# Patient Record
Sex: Female | Born: 1995
Health system: Southern US, Community
[De-identification: ages and names within clinical notes are randomized; demographics above are authoritative.]

## PROBLEM LIST (undated history)

## (undated) ENCOUNTER — Inpatient Hospital Stay (HOSPITAL_COMMUNITY): Payer: Self-pay

## (undated) DIAGNOSIS — F32A Depression, unspecified: Secondary | ICD-10-CM

## (undated) DIAGNOSIS — B999 Unspecified infectious disease: Secondary | ICD-10-CM

## (undated) DIAGNOSIS — J45909 Unspecified asthma, uncomplicated: Secondary | ICD-10-CM

## (undated) DIAGNOSIS — F419 Anxiety disorder, unspecified: Secondary | ICD-10-CM

## (undated) DIAGNOSIS — C801 Malignant (primary) neoplasm, unspecified: Secondary | ICD-10-CM

## (undated) DIAGNOSIS — F329 Major depressive disorder, single episode, unspecified: Secondary | ICD-10-CM

## (undated) DIAGNOSIS — R519 Headache, unspecified: Secondary | ICD-10-CM

## (undated) DIAGNOSIS — R569 Unspecified convulsions: Secondary | ICD-10-CM

## (undated) DIAGNOSIS — D649 Anemia, unspecified: Secondary | ICD-10-CM

## (undated) DIAGNOSIS — R51 Headache: Secondary | ICD-10-CM

## (undated) DIAGNOSIS — Z803 Family history of malignant neoplasm of breast: Secondary | ICD-10-CM

## (undated) HISTORY — DX: Unspecified asthma, uncomplicated: J45.909

## (undated) HISTORY — DX: Anemia, unspecified: D64.9

## (undated) HISTORY — PX: NO PAST SURGERIES: SHX2092

## (undated) HISTORY — DX: Family history of malignant neoplasm of breast: Z80.3

---
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
## 2016-06-12 ENCOUNTER — Ambulatory Visit (INDEPENDENT_AMBULATORY_CARE_PROVIDER_SITE_OTHER): Payer: Managed Care, Other (non HMO) | Admitting: Physician Assistant

## 2016-06-12 VITALS — BP 104/70 | HR 87 | Temp 98.1°F | Resp 17 | Ht 62.0 in | Wt 109.0 lb

## 2016-06-12 DIAGNOSIS — M549 Dorsalgia, unspecified: Secondary | ICD-10-CM | POA: Diagnosis not present

## 2016-06-12 DIAGNOSIS — Z3201 Encounter for pregnancy test, result positive: Secondary | ICD-10-CM

## 2016-06-12 DIAGNOSIS — R350 Frequency of micturition: Secondary | ICD-10-CM | POA: Diagnosis not present

## 2016-06-12 LAB — POCT CBC
Granulocyte percent: 5.4 %G — AB (ref 37–80)
HCT, POC: 37.2 % — AB (ref 37.7–47.9)
HEMOGLOBIN: 13.1 g/dL (ref 12.2–16.2)
LYMPH, POC: 1.5 (ref 0.6–3.4)
MCH, POC: 32.5 pg — AB (ref 27–31.2)
MCHC: 35.1 g/dL (ref 31.8–35.4)
MCV: 92.5 fL (ref 80–97)
MID (cbc): 0.3 (ref 0–0.9)
MPV: 7.7 fL (ref 0–99.8)
PLATELET COUNT, POC: 215 10*3/uL (ref 142–424)
POC Granulocyte: 5.4 (ref 2–6.9)
POC LYMPH PERCENT: 21 %L (ref 10–50)
POC MID %: 4.5 % (ref 0–12)
RBC: 4.03 M/uL — AB (ref 4.04–5.48)
RDW, POC: 12.3 %
WBC: 7.2 10*3/uL (ref 4.6–10.2)

## 2016-06-12 LAB — POC MICROSCOPIC URINALYSIS (UMFC): MUCUS RE: ABSENT

## 2016-06-12 LAB — POCT URINALYSIS DIP (MANUAL ENTRY)
BILIRUBIN UA: NEGATIVE
Glucose, UA: NEGATIVE
Ketones, POC UA: NEGATIVE
Leukocytes, UA: NEGATIVE
NITRITE UA: NEGATIVE
PH UA: 6
Protein Ur, POC: NEGATIVE
RBC UA: NEGATIVE
Spec Grav, UA: >=1.03
UROBILINOGEN UA: 1

## 2016-06-12 LAB — POCT URINE PREGNANCY: PREG TEST UR: POSITIVE — AB

## 2016-06-12 NOTE — Patient Instructions (Addendum)
Your urine is untelling.  I will send out for a urine culture.  If anything grows, then I will contact you. By your last menstrual period, you are 4 weeks and 1 day.   If you experience severe abdominal pain, dizziness, bleeding--please seek medical care. If you need any extra guidance please contact me. If you have questions that you may want to ask outside of here to weigh your options, there is also planned parenthood.  First Trimester of Pregnancy The first trimester of pregnancy is from week 1 until the end of week 12 (months 1 through 3). During this time, your baby will begin to develop inside you. At 6-8 weeks, the eyes and face are formed, and the heartbeat can be seen on ultrasound. At the end of 12 weeks, all the baby's organs are formed. Prenatal care is all the medical care you receive before the birth of your baby. Make sure you get good prenatal care and follow all of your doctor's instructions. Follow these instructions at home: Medicines  Take medicine only as told by your doctor. Some medicines are safe and some are not during pregnancy.  Take your prenatal vitamins as told by your doctor.  Take medicine that helps you poop (stool softener) as needed if your doctor says it is okay. Diet  Eat regular, healthy meals.  Your doctor will tell you the amount of weight gain that is right for you.  Avoid raw meat and uncooked cheese.  If you feel sick to your stomach (nauseous) or throw up (vomit):  Eat 4 or 5 small meals a day instead of 3 large meals.  Try eating a few soda crackers.  Drink liquids between meals instead of during meals.  If you have a hard time pooping (constipation):  Eat high-fiber foods like fresh vegetables, fruit, and whole grains.  Drink enough fluids to keep your pee (urine) clear or pale yellow. Activity and Exercise  Exercise only as told by your doctor. Stop exercising if you have cramps or pain in your lower belly (abdomen) or low  back.  Try to avoid standing for long periods of time. Move your legs often if you must stand in one place for a long time.  Avoid heavy lifting.  Wear low-heeled shoes. Sit and stand up straight.  You can have sex unless your doctor tells you not to. Relief of Pain or Discomfort  Wear a good support bra if your breasts are sore.  Take warm water baths (sitz baths) to soothe pain or discomfort caused by hemorrhoids. Use hemorrhoid cream if your doctor says it is okay.  Rest with your legs raised if you have leg cramps or low back pain.  Wear support hose if you have puffy, bulging veins (varicose veins) in your legs. Raise (elevate) your feet for 15 minutes, 3-4 times a day. Limit salt in your diet. Prenatal Care  Schedule your prenatal visits by the twelfth week of pregnancy.  Write down your questions. Take them to your prenatal visits.  Keep all your prenatal visits as told by your doctor. Safety  Wear your seat belt at all times when driving.  Make a list of emergency phone numbers. The list should include numbers for family, friends, the hospital, and police and fire departments. General Tips  Ask your doctor for a referral to a local prenatal class. Begin classes no later than at the start of month 6 of your pregnancy.  Ask for help if you need counseling or  help with nutrition. Your doctor can give you advice or tell you where to go for help.  Do not use hot tubs, steam rooms, or saunas.  Do not douche or use tampons or scented sanitary pads.  Do not cross your legs for long periods of time.  Avoid litter boxes and soil used by cats.  Avoid all smoking, herbs, and alcohol. Avoid drugs not approved by your doctor.  Do not use any tobacco products, including cigarettes, chewing tobacco, and electronic cigarettes. If you need help quitting, ask your doctor. You may get counseling or other support to help you quit.  Visit your dentist. At home, brush your teeth with  a soft toothbrush. Be gentle when you floss. Get help if:  You are dizzy.  You have mild cramps or pressure in your lower belly.  You have a nagging pain in your belly area.  You continue to feel sick to your stomach, throw up, or have watery poop (diarrhea).  You have a bad smelling fluid coming from your vagina.  You have pain with peeing (urination).  You have increased puffiness (swelling) in your face, hands, legs, or ankles. Get help right away if:  You have a fever.  You are leaking fluid from your vagina.  You have spotting or bleeding from your vagina.  You have very bad belly cramping or pain.  You gain or lose weight rapidly.  You throw up blood. It may look like coffee grounds.  You are around people who have [place] measles, fifth disease, or chickenpox.  You have a very bad headache.  You have shortness of breath.  You have any kind of trauma, such as from a fall or a car accident. This information is not intended to replace advice given to you by your health care provider. Make sure you discuss any questions you have with your health care provider. Document Released: 12/10/2007 Document Revised: 11/29/2015 Document Reviewed: 05/03/2013 Elsevier Interactive Patient Education  2017 [employer].    IF you received an x-ray today, you will receive an invoice from [facility]. Please contact [facility] at [phone] with questions or concerns regarding your invoice.   IF you received labwork today, you will receive an invoice from [employer]. Please contact [facility] at [phone] with questions or concerns regarding your invoice.   Our billing staff will not be able to assist you with questions regarding bills from these companies.  You will be contacted with the lab results as soon as they are available. The fastest way to get your results is to activate your My Chart account. Instructions are located on  the last page of this paperwork. If you have not heard from [place] regarding the results in 2 weeks, please contact this office.

## 2016-06-12 NOTE — Progress Notes (Addendum)
[facility] [address], [place] [address] [phone]  Date:  06/12/2016   Name:  Michaela Williams   DOB:  [birth year]   MRN:  [id number]  PCP:  No primary care provider on file.   Chief Complaint  Patient presents with  . Other    Pt unsure why she is here. Pt states mother told her to come to the Dr.   . Wynelle Link Pain  . Urinary Tract Infection    strong odor     History of Present Illness:  Michaela Williams is a 20 y.o. female patient who presents to [facility] for cc of back pain and urinary odor.  She has been without her medication for the last 2 weeks.  Sexually active with protection.   Urinary symptoms--lower back pains, malodorous urine, frequency, and mild pain.  No hematuria.  No abnormal vaginal discharge or odor.  Subjective fever and chills.   No vaginal bleeding.   Patient's last menstrual period was 05/14/2016.   There are no active problems to display for this patient.   No past medical history on file.  No past surgical history on file.  Social History  Substance Use Topics  . Smoking status: Never Smoker  . Smokeless tobacco: Not on file  . Alcohol use Not on file    Family History  Problem Relation Age of Onset  . Mental illness Mother   . Cancer Maternal Grandmother   . Hypertension Maternal Grandmother     Not on File  Medication list has been reviewed and updated.  No current outpatient prescriptions on file prior to visit.   No current facility-administered medications on file prior to visit.     ROS ROS otherwise unremarkable unless listed above.   Physical Examination: BP 104/70 (BP Location: Right Arm, Patient Position: Sitting, Cuff Size: Normal)   Pulse 87   Temp 98.1 F (36.7 C) (Oral)   Resp 17   Ht 5\' 2"  (1.575 m)   Wt 109 lb (49.4 kg)   LMP 05/14/2016   SpO2 100%   BMI 19.94 kg/m  Ideal Body Weight: Weight in (lb) to have BMI = 25: 136.4  Physical Exam  Constitutional: She is oriented to person,  place, and time. She appears well-developed and well-nourished. No distress.  HENT:  Head: Normocephalic and atraumatic.  Right Ear: External ear normal.  Left Ear: External ear normal.  Eyes: Conjunctivae and EOM are normal. Pupils are equal, round, and reactive to light.  Cardiovascular: Normal rate.   Pulmonary/Chest: Effort normal. No respiratory distress. She has no decreased breath sounds. She has no wheezes.  Abdominal: Soft. Normal appearance and bowel sounds are normal. There is tenderness in the right lower quadrant, periumbilical area, suprapubic area and left lower quadrant. There is CVA tenderness.  Neurological: She is alert and oriented to person, place, and time.  Skin: She is not diaphoretic.  Psychiatric: She has a normal mood and affect. Her behavior is normal.     Results for orders placed or performed in visit on 06/12/16  Beta HCG, Quant  Result Value Ref Range   hCG Quant 391 mIU/mL  POCT Microscopic Urinalysis ([facility])  Result Value Ref Range   WBC,UR,HPF,POC Moderate (A) None WBC/hpf   RBC,UR,HPF,POC None None RBC/hpf   Bacteria None None, Too numerous to count   Mucus Absent Absent   Epithelial Cells, UR Per Microscopy Few (A) None, Too numerous to count cells/hpf  POCT urinalysis dipstick  Result Value Ref Range   Color, UA yellow yellow   Clarity, UA clear clear   Glucose, UA negative negative   Bilirubin, UA negative negative   Ketones, POC UA negative negative   Spec Grav, UA >=1.030    Blood, UA negative negative   pH, UA 6.0    Protein Ur, POC negative negative   Urobilinogen, UA 1.0    Nitrite, UA Negative Negative   Leukocytes, UA Negative Negative  POCT urine pregnancy  Result Value Ref Range   Preg Test, Ur Positive (A) Negative  POCT CBC  Result Value Ref Range   WBC 7.2 4.6 - 10.2 K/uL   Lymph, poc 1.5 0.6 - 3.4   POC LYMPH PERCENT 21.0 10 - 50 %L   MID (cbc) 0.3 0 - 0.9   POC MID % 4.5 0 - 12 %M   POC Granulocyte 5.4 2 - 6.9    Granulocyte percent 5.4 (A) 37 - 80 %G   RBC 4.03 (A) 4.04 - 5.48 M/uL   Hemoglobin 13.1 12.2 - 16.2 g/dL   HCT, POC 37.2 (A) 37.7 - 47.9 %   MCV 92.5 80 - 97 fL   MCH, POC 32.5 (A) 27 - 31.2 pg   MCHC 35.1 31.8 - 35.4 g/dL   RDW, POC 12.3 %   Platelet Count, POC 215 142 - 424 K/uL   MPV 7.7 0 - 99.8 fL     Assessment and Plan: Michaela Williams is a 20 y.o. female who is here today for cc of malodorous odor. Will go ahead with urinary culture.  Obtaining hcg quant to narrow weeks of conception. Letter written for obgyn. Discussed options.    Urine frequency - Plan: POCT Microscopic Urinalysis ([facility]), POCT urinalysis dipstick, POCT urine pregnancy  Acute bilateral back pain, unspecified back location - Plan: POCT CBC  Positive pregnancy test - Plan: Beta HCG, Quant  Appears to be 4-6 weeks.  Ivar Drape, PA-C [facility] 07/11/2016 1:18 PM

## 2016-06-13 LAB — BETA HCG QUANT (REF LAB): hCG Quant: 391 m[IU]/mL

## 2016-06-18 ENCOUNTER — Telehealth: Payer: Self-pay

## 2016-06-18 NOTE — Telephone Encounter (Signed)
pts mom called and said daugher is till awaiting a call re her Labs,  Please call pt at [phone]

## 2016-06-19 NOTE — Telephone Encounter (Signed)
Routed to [place]. Please review.

## 2016-06-20 NOTE — Telephone Encounter (Signed)
Patient is calling again about getting her lab results  [phone]

## 2016-07-07 NOTE — L&D Delivery Note (Signed)
21 y.o. G1P0000 at [redacted]w[redacted]d admitted for SOL delivered a viable female infant at 2446 in cephalic, LOA position. No nuchal cord. Right anterior shoulder delivered with ease. 60 sec delayed cord clamping. Cord clamped x2 and cut. Placenta delivered spontaneously intact, with 3VC. Fundus firm on exam with massage and pitocin. Good hemostasis noted.  Anesthesia: Epidural Laceration: 2nd degree perineal Suture: 3.0 Vicryl Good hemostasis noted. EBL: 250 cc  Mom and baby recovering in LDR.    Apgars: APGAR (1 MIN): 9   APGAR (5 MINS): 10   Weight: Pending skin to skin  Sponge and instrument count were correct x2. Placenta sent to L&D. Breast feeding, IUD vs Nexplanon for contraception.  [place] Jahzeel Poythress, DO FM Resident PGY-1 02/04/2017 6:38 AM

## 2016-07-11 ENCOUNTER — Encounter: Payer: Self-pay | Admitting: *Deleted

## 2016-07-16 ENCOUNTER — Ambulatory Visit (INDEPENDENT_AMBULATORY_CARE_PROVIDER_SITE_OTHER): Payer: Managed Care, Other (non HMO) | Admitting: Obstetrics & Gynecology

## 2016-07-16 ENCOUNTER — Encounter: Payer: Self-pay | Admitting: Obstetrics & Gynecology

## 2016-07-16 DIAGNOSIS — Z124 Encounter for screening for malignant neoplasm of cervix: Secondary | ICD-10-CM

## 2016-07-16 DIAGNOSIS — Z113 Encounter for screening for infections with a predominantly sexual mode of transmission: Secondary | ICD-10-CM | POA: Diagnosis not present

## 2016-07-16 DIAGNOSIS — O99519 Diseases of the respiratory system complicating pregnancy, unspecified trimester: Secondary | ICD-10-CM

## 2016-07-16 DIAGNOSIS — O99511 Diseases of the respiratory system complicating pregnancy, first trimester: Secondary | ICD-10-CM

## 2016-07-16 DIAGNOSIS — F419 Anxiety disorder, unspecified: Secondary | ICD-10-CM

## 2016-07-16 DIAGNOSIS — Z3490 Encounter for supervision of normal pregnancy, unspecified, unspecified trimester: Secondary | ICD-10-CM | POA: Insufficient documentation

## 2016-07-16 DIAGNOSIS — J45909 Unspecified asthma, uncomplicated: Secondary | ICD-10-CM

## 2016-07-16 DIAGNOSIS — F329 Major depressive disorder, single episode, unspecified: Secondary | ICD-10-CM

## 2016-07-16 DIAGNOSIS — O99340 Other mental disorders complicating pregnancy, unspecified trimester: Secondary | ICD-10-CM

## 2016-07-16 DIAGNOSIS — Z8659 Personal history of other mental and behavioral disorders: Secondary | ICD-10-CM | POA: Insufficient documentation

## 2016-07-16 DIAGNOSIS — O99341 Other mental disorders complicating pregnancy, first trimester: Secondary | ICD-10-CM

## 2016-07-16 DIAGNOSIS — Z9189 Other specified personal risk factors, not elsewhere classified: Secondary | ICD-10-CM | POA: Insufficient documentation

## 2016-07-16 NOTE — Patient Instructions (Signed)
First Trimester of Pregnancy The first trimester of pregnancy is from week 1 until the end of week 12 (months 1 through 3). A week after a sperm fertilizes an egg, the egg will implant on the wall of the uterus. This embryo will begin to develop into a baby. Genes from you and your partner are forming the baby. The female genes determine whether the baby is a boy or a girl. At 6-8 weeks, the eyes and face are formed, and the heartbeat can be seen on ultrasound. At the end of 12 weeks, all the baby's organs are formed.  Now that you are pregnant, you will want to do everything you can to have a healthy baby. Two of the most important things are to get good prenatal care and to follow your health care provider's instructions. Prenatal care is all the medical care you receive before the baby's birth. This care will help prevent, find, and treat any problems during the pregnancy and childbirth. BODY CHANGES Your body goes through many changes during pregnancy. The changes vary from woman to woman.   You may gain or lose a couple of pounds at first.  You may feel sick to your stomach (nauseous) and throw up (vomit). If the vomiting is uncontrollable, call your health care provider.  You may tire easily.  You may develop headaches that can be relieved by medicines approved by your health care provider.  You may urinate more often. Painful urination may mean you have a bladder infection.  You may develop heartburn as a result of your pregnancy.  You may develop constipation because certain hormones are causing the muscles that push waste through your intestines to slow down.  You may develop hemorrhoids or swollen, bulging veins (varicose veins).  Your breasts may begin to grow larger and become tender. Your nipples may stick out more, and the tissue that surrounds them (areola) may become darker.  Your gums may bleed and may be sensitive to brushing and flossing.  Dark spots or blotches (chloasma,  mask of pregnancy) may develop on your face. This will likely fade after the baby is born.  Your menstrual periods will stop.  You may have a loss of appetite.  You may develop cravings for certain kinds of food.  You may have changes in your emotions from day to day, such as being excited to be pregnant or being concerned that something may go wrong with the pregnancy and baby.  You may have more vivid and strange dreams.  You may have changes in your hair. These can include thickening of your hair, rapid growth, and changes in texture. Some women also have hair loss during or after pregnancy, or hair that feels dry or thin. Your hair will most likely return to normal after your baby is born. WHAT TO EXPECT AT YOUR PRENATAL VISITS During a routine prenatal visit:  You will be weighed to make sure you and the baby are growing normally.  Your blood pressure will be taken.  Your abdomen will be measured to track your baby's growth.  The fetal heartbeat will be listened to starting around week 10 or 12 of your pregnancy.  Test results from any previous visits will be discussed. Your health care provider may ask you:  How you are feeling.  If you are feeling the baby move.  If you have had any abnormal symptoms, such as leaking fluid, bleeding, severe headaches, or abdominal cramping.  If you are using any tobacco products,   including cigarettes, chewing tobacco, and electronic cigarettes.  If you have any questions. Other tests that may be performed during your first trimester include:  Blood tests to find your blood type and to check for the presence of any previous infections. They will also be used to check for low iron levels (anemia) and Rh antibodies. Later in the pregnancy, blood tests for diabetes will be done along with other tests if problems develop.  Urine tests to check for infections, diabetes, or protein in the urine.  An ultrasound to confirm the proper growth  and development of the baby.  An amniocentesis to check for possible genetic problems.  Fetal screens for spina bifida and Down syndrome.  You may need other tests to make sure you and the baby are doing well.  HIV (human immunodeficiency virus) testing. Routine prenatal testing includes screening for HIV, unless you choose not to have this test. HOME CARE INSTRUCTIONS  Medicines   Follow your health care provider's instructions regarding medicine use. Specific medicines may be either safe or unsafe to take during pregnancy.  Take your prenatal vitamins as directed.  If you develop constipation, try taking a stool softener if your health care provider approves. Diet   Eat regular, well-balanced meals. Choose a variety of foods, such as meat or vegetable-based protein, fish, milk and low-fat dairy products, vegetables, fruits, and whole grain breads and cereals. Your health care provider will help you determine the amount of weight gain that is right for you.  Avoid raw meat and uncooked cheese. These carry germs that can cause birth defects in the baby.  Eating four or five small meals rather than three large meals a day may help relieve nausea and vomiting. If you start to feel nauseous, eating a few soda crackers can be helpful. Drinking liquids between meals instead of during meals also seems to help nausea and vomiting.  If you develop constipation, eat more high-fiber foods, such as fresh vegetables or fruit and whole grains. Drink enough fluids to keep your urine clear or pale yellow. Activity and Exercise   Exercise only as directed by your health care provider. Exercising will help you:  Control your weight.  Stay in shape.  Be prepared for labor and delivery.  Experiencing pain or cramping in the lower abdomen or low back is a good sign that you should stop exercising. Check with your health care provider before continuing normal exercises.  Try to avoid standing for  long periods of time. Move your legs often if you must stand in one place for a long time.  Avoid heavy lifting.  Wear low-heeled shoes, and practice good posture.  You may continue to have sex unless your health care provider directs you otherwise. Relief of Pain or Discomfort   Wear a good support bra for breast tenderness.   Take warm sitz baths to soothe any pain or discomfort caused by hemorrhoids. Use hemorrhoid cream if your health care provider approves.   Rest with your legs elevated if you have leg cramps or low back pain.  If you develop varicose veins in your legs, wear support hose. Elevate your feet for 15 minutes, 3-4 times a day. Limit salt in your diet. Prenatal Care   Schedule your prenatal visits by the twelfth week of pregnancy. They are usually scheduled monthly at first, then more often in the last 2 months before delivery.  Write down your questions. Take them to your prenatal visits.  Keep all your prenatal  legs, wear support hose. Elevate your feet for 15 minutes, 3-4 times a day. Limit salt in your diet.  Prenatal Care  · Schedule your prenatal visits by the twelfth week of pregnancy. They are usually scheduled monthly at first, then more often in the last 2 months before delivery.  · Write down your questions. Take them to your prenatal visits.  · Keep all your prenatal visits as directed by your health care provider.  Safety  · Wear your seat belt at all times when driving.  · Make a list of emergency phone numbers, including numbers for family, friends, the hospital, and police and fire departments.  General Tips  · Ask your health care provider for a referral to a local prenatal education class. Begin classes no later than at the beginning of month 6 of your pregnancy.  · Ask for help if you have counseling or nutritional needs during pregnancy. Your health care provider can offer advice or refer you to specialists for help with various needs.  · Do not use hot tubs, steam rooms, or saunas.  · Do not douche or use tampons or scented sanitary pads.  · Do not cross your legs for long periods of time.  · Avoid cat litter boxes and soil used by cats. These carry germs that can cause birth defects in the baby and possibly loss of the fetus by miscarriage or stillbirth.   · Avoid all smoking, herbs, alcohol, and medicines not prescribed by your health care provider. Chemicals in these affect the formation and growth of the baby.  · Do not use any tobacco products, including cigarettes, chewing tobacco, and electronic cigarettes. If you need help quitting, ask your health care provider. You may receive counseling support and other resources to help you quit.  · Schedule a dentist appointment. At home, brush your teeth with a soft toothbrush and be gentle when you floss.  SEEK MEDICAL CARE IF:   · You have dizziness.  · You have mild pelvic cramps, pelvic pressure, or nagging pain in the abdominal area.  · You have persistent nausea, vomiting, or diarrhea.  · You have a bad smelling vaginal discharge.  · You have pain with urination.  · You notice increased swelling in your face, hands, legs, or ankles.  SEEK IMMEDIATE MEDICAL CARE IF:   · You have a fever.  · You are leaking fluid from your vagina.  · You have spotting or bleeding from your vagina.  · You have severe abdominal cramping or pain.  · You have rapid weight gain or loss.  · You vomit blood or material that looks like coffee grounds.  · You are exposed to [place] measles and have never had them.  · You are exposed to fifth disease or chickenpox.  · You develop a severe headache.  · You have shortness of breath.  · You have any kind of trauma, such as from a fall or a car accident.     This information is not intended to replace advice given to you by your health care provider. Make sure you discuss any questions you have with your health care provider.     Document Released: 06/17/2001 Document Revised: 07/14/2014 Document Reviewed: 05/03/2013  Elsevier Interactive Patient Education ©2017 [employer]

## 2016-07-16 NOTE — Progress Notes (Signed)
Patient is in the office for initial ob visit, states that she is adopted and that down syndrome and autism run in her biological family.

## 2016-07-16 NOTE — Progress Notes (Signed)
  Subjective:    Michaela Williams is being seen today for her first obstetrical visit.  This is not a planned pregnancy. She is at [redacted]w[redacted]d gestation. Her obstetrical history is significant for anemia. Relationship with FOB: significant other, not living together. Patient does intend to breast feed. Pregnancy history fully reviewed.  Patient reports some anxiety; c/o some nausea.  Review of Systems:   Review of Systems  Objective:     BP 114/79   Pulse 82   Temp 98.6 F (37 C)   Wt 112 lb (50.8 kg)   LMP 05/14/2016   BMI 20.49 kg/m  Physical Exam  Exam  General Appearance:    Alert, cooperative, no distress, appears stated age  Head:    Normocephalic, without obvious abnormality, atraumatic  Eyes:    conjunctiva/corneas clear, EOM's intact, both eyes  Ears:    Normal external ear canals, both ears  Nose:   Nares normal, septum midline, mucosa normal, no drainage    or sinus tenderness  Throat:   Lips, mucosa, and tongue normal; teeth and gums normal  Neck:   Supple, symmetrical, trachea midline, no adenopathy;    thyroid:  no enlargement/tenderness/nodules  Back:     Symmetric, no curvature, ROM normal, no CVA tenderness  Lungs:     Clear to auscultation bilaterally, respirations unlabored  Chest Wall:    No tenderness or deformity   Heart:    Regular rate and rhythm, S1 and S2 normal, no murmur, rub   or gallop  Breast Exam:    No tenderness, masses, or nipple abnormality  Abdomen:     Soft, non-tender, bowel sounds active all four quadrants,    no masses, no organomegaly  Genitalia:    Normal female without lesion, discharge or tenderness; uterus 8 weeks sized     Extremities:   Extremities normal, atraumatic, no cyanosis or edema  Pulses:   2+ and symmetric all extremities  Skin:   Skin color, texture, turgor normal, no rashes or lesions      Assessment:      Patient Active Problem List   Diagnosis Date Noted  . Supervision of normal pregnancy, antepartum 07/16/2016   . Asthma affecting pregnancy, antepartum 07/16/2016  . Anxiety disorder affecting pregnancy, antepartum 07/16/2016  . Depression affecting pregnancy 07/16/2016    Plan:     Initial labs drawn. Prenatal vitamins. Problem list reviewed and updated. AFP3 discussed: integrated screen requested. Role of ultrasound in pregnancy discussed; fetal survey: requested. Amniocentesis discussed: not indicated. Follow up in 4 weeks. 60*% of 40 min visit spent on counseling and coordination of care.   Needs a referral to see Roselyn Reef at [facility] 07/16/2016

## 2016-07-16 NOTE — Addendum Note (Signed)
Addended by: Tristan Schroeder D on: 07/16/2016 03:56 PM   Modules accepted: Orders

## 2016-07-17 LAB — GC/CHLAMYDIA PROBE AMP (~~LOC~~) NOT AT ARMC
CHLAMYDIA, DNA PROBE: NEGATIVE
Neisseria Gonorrhea: NEGATIVE

## 2016-07-17 LAB — CYTOLOGY - PAP
Diagnosis: NEGATIVE
TRICH (WINDOWPATH): NEGATIVE

## 2016-07-18 LAB — OBSTETRIC PANEL, INCLUDING HIV
ANTIBODY SCREEN: NEGATIVE
BASOS: 0 %
Basophils Absolute: 0 10*3/uL (ref 0.0–0.2)
EOS (ABSOLUTE): 0 10*3/uL (ref 0.0–0.4)
Eos: 0 %
HEMATOCRIT: 35.6 % (ref 34.0–46.6)
HIV Screen 4th Generation wRfx: NONREACTIVE
Hemoglobin: 12 g/dL (ref 11.1–15.9)
Hepatitis B Surface Ag: NEGATIVE
IMMATURE GRANS (ABS): 0 10*3/uL (ref 0.0–0.1)
Immature Granulocytes: 0 %
LYMPHS: 12 %
Lymphocytes Absolute: 1.1 10*3/uL (ref 0.7–3.1)
MCH: 31.3 pg (ref 26.6–33.0)
MCHC: 33.7 g/dL (ref 31.5–35.7)
MCV: 93 fL (ref 79–97)
MONOS ABS: 0.6 10*3/uL (ref 0.1–0.9)
Monocytes: 6 %
Neutrophils Absolute: 7.8 10*3/uL — ABNORMAL HIGH (ref 1.4–7.0)
Neutrophils: 82 %
PLATELETS: 266 10*3/uL (ref 150–379)
RBC: 3.84 x10E6/uL (ref 3.77–5.28)
RDW: 13.5 % (ref 12.3–15.4)
RPR Ser Ql: NONREACTIVE
Rh Factor: POSITIVE
Rubella Antibodies, IGG: 8.3 index (ref >0.99)
WBC: 9.5 10*3/uL (ref 3.4–10.8)

## 2016-07-18 LAB — HEMOGLOBINOPATHY EVALUATION
HEMOGLOBIN F QUANTITATION: 0 % (ref 0.0–2.0)
HGB C: 0 %
HGB S: 0 %
HGB VARIANT: 0 %
Hemoglobin A2 Quantitation: 2.7 % (ref 1.8–3.2)
Hgb A: 97.3 % (ref 96.4–98.8)

## 2016-07-18 LAB — VARICELLA ZOSTER ANTIBODY, IGG: VARICELLA: 719 {index} (ref >165)

## 2016-07-19 LAB — URINE CULTURE, OB REFLEX

## 2016-07-19 LAB — CULTURE, OB URINE

## 2016-07-21 ENCOUNTER — Institutional Professional Consult (permissible substitution): Payer: 59

## 2016-07-21 ENCOUNTER — Other Ambulatory Visit: Payer: Self-pay | Admitting: Obstetrics & Gynecology

## 2016-07-21 DIAGNOSIS — O2340 Unspecified infection of urinary tract in pregnancy, unspecified trimester: Secondary | ICD-10-CM | POA: Insufficient documentation

## 2016-07-21 MED ORDER — NITROFURANTOIN MONOHYD MACRO 100 MG PO CAPS: 100.0000 mg | ORAL_CAPSULE | Freq: Two times a day (BID) | ORAL | 1 refills | Status: DC

## 2016-07-23 ENCOUNTER — Institutional Professional Consult (permissible substitution): Payer: 59

## 2016-07-23 LAB — TOXASSURE SELECT 13 (MW), URINE

## 2016-07-25 NOTE — Progress Notes (Signed)
Pt made aware of result and provider sent Rx to pharmacy.

## 2016-07-28 ENCOUNTER — Institutional Professional Consult (permissible substitution): Payer: 59

## 2016-08-07 ENCOUNTER — Encounter (HOSPITAL_COMMUNITY): Payer: Self-pay | Admitting: Obstetrics & Gynecology

## 2016-08-13 ENCOUNTER — Encounter (HOSPITAL_COMMUNITY): Payer: Self-pay

## 2016-08-13 ENCOUNTER — Ambulatory Visit (HOSPITAL_COMMUNITY)
Admission: RE | Admit: 2016-08-13 | Discharge: 2016-08-13 | Disposition: A | Discharge status: Home / Self Care | Payer: Managed Care, Other (non HMO) | Source: Ambulatory Visit | Attending: Obstetrics & Gynecology | Admitting: Obstetrics & Gynecology

## 2016-08-13 DIAGNOSIS — Z3400 Encounter for supervision of normal first pregnancy, unspecified trimester: Secondary | ICD-10-CM

## 2016-08-13 DIAGNOSIS — Z3682 Encounter for antenatal screening for nuchal translucency: Secondary | ICD-10-CM | POA: Diagnosis not present

## 2016-08-13 DIAGNOSIS — Z3490 Encounter for supervision of normal pregnancy, unspecified, unspecified trimester: Secondary | ICD-10-CM

## 2016-08-13 DIAGNOSIS — Z3A13 13 weeks gestation of pregnancy: Secondary | ICD-10-CM | POA: Insufficient documentation

## 2016-08-14 ENCOUNTER — Encounter: Payer: 59 | Admitting: Obstetrics and Gynecology

## 2016-08-16 ENCOUNTER — Other Ambulatory Visit: Payer: Self-pay

## 2016-08-18 ENCOUNTER — Encounter: Payer: Self-pay | Admitting: *Deleted

## 2016-08-20 ENCOUNTER — Encounter: Payer: Self-pay | Admitting: *Deleted

## 2016-08-20 ENCOUNTER — Ambulatory Visit (INDEPENDENT_AMBULATORY_CARE_PROVIDER_SITE_OTHER): Payer: 59 | Admitting: Certified Nurse Midwife

## 2016-08-20 VITALS — BP 105/71 | HR 99 | Wt 114.0 lb

## 2016-08-20 DIAGNOSIS — O2341 Unspecified infection of urinary tract in pregnancy, first trimester: Secondary | ICD-10-CM

## 2016-08-20 DIAGNOSIS — O2340 Unspecified infection of urinary tract in pregnancy, unspecified trimester: Secondary | ICD-10-CM

## 2016-08-20 DIAGNOSIS — Z3400 Encounter for supervision of normal first pregnancy, unspecified trimester: Secondary | ICD-10-CM

## 2016-08-20 DIAGNOSIS — O99511 Diseases of the respiratory system complicating pregnancy, first trimester: Secondary | ICD-10-CM

## 2016-08-20 DIAGNOSIS — Z9189 Other specified personal risk factors, not elsewhere classified: Secondary | ICD-10-CM

## 2016-08-20 DIAGNOSIS — J45909 Unspecified asthma, uncomplicated: Secondary | ICD-10-CM

## 2016-08-20 MED ORDER — OB COMPLETE PETITE 35-5-1-200 MG PO CAPS: 1.0000 | ORAL_CAPSULE | Freq: Every day | ORAL | 12 refills | Status: DC

## 2016-08-20 NOTE — Progress Notes (Signed)
Pt states lower pelvic/groin cramps.

## 2016-08-20 NOTE — Progress Notes (Signed)
   PRENATAL VISIT NOTE  Subjective:  Michaela Williams is a 21 y.o. G1P0 at [redacted]w[redacted]d being seen today for ongoing prenatal care.  She is currently monitored for the following issues for this low-risk pregnancy and has Supervision of normal pregnancy, antepartum; Childhood asthma; History of anxiety; At risk for depression; and UTI in pregnancy, antepartum on her problem list.  Patient reports no complaints.  Contractions: Not present. Vag. Bleeding: None.  Movement: Present. Denies leaking of fluid.   The following portions of the patient's history were reviewed and updated as appropriate: allergies, current medications, past family history, past medical history, past social history, past surgical history and problem list. Problem list updated.  Objective:   Vitals:   08/20/16 1356  BP: 105/71  Pulse: 99  Weight: 114 lb (51.7 kg)    Fetal Status: Fetal Heart Rate (bpm): 152   Movement: Present     General:  Alert, oriented and cooperative. Patient is in no acute distress.  Skin: Skin is warm and dry. No rash noted.   Cardiovascular: Normal heart rate noted  Respiratory: Normal respiratory effort, no problems with respiration noted  Abdomen: Soft, gravid, appropriate for gestational age. Pain/Pressure: Present     Pelvic:  Cervical exam deferred        Extremities: Normal range of motion.     Mental Status: Normal mood and affect. Normal behavior. Normal judgment and thought content.   Assessment and Plan:  Pregnancy: G1P0 at [redacted]w[redacted]d  1. Supervision of normal first pregnancy, antepartum     - [place] MFM OB COMP + 14 WK; Future - Prenat-FeCbn-FeAspGl-FA-Omega (OB COMPLETE PETITE) 35-5-1-200 MG CAPS; Take 1 tablet by mouth daily.  Dispense: 30 capsule; Refill: 12  2. UTI in pregnancy, antepartum     TOC today - Culture, OB Urine  3. Childhood asthma without complication, unspecified asthma severity, unspecified whether persistent      Not clear when the last time she had an attack,  according to her mother she had one when she was 11 years of age  10. At risk for depression     Had depression as a teenager.  Nothing since the age of 58 years.  FOB involved.   Preterm labor symptoms and general obstetric precautions including but not limited to vaginal bleeding, contractions, leaking of fluid and fetal movement were reviewed in detail with the patient. Please refer to After Visit Summary for other counseling recommendations.  Return in about 4 weeks (around 09/17/2016).   Morene Crocker, CNM

## 2016-09-17 ENCOUNTER — Ambulatory Visit (HOSPITAL_COMMUNITY)
Admission: RE | Admit: 2016-09-17 | Discharge: 2016-09-17 | Disposition: A | Discharge status: Home / Self Care | Payer: 59 | Source: Ambulatory Visit | Attending: Certified Nurse Midwife | Admitting: Certified Nurse Midwife

## 2016-09-17 ENCOUNTER — Ambulatory Visit (INDEPENDENT_AMBULATORY_CARE_PROVIDER_SITE_OTHER): Payer: 59 | Admitting: Certified Nurse Midwife

## 2016-09-17 ENCOUNTER — Encounter: Payer: Self-pay | Admitting: Certified Nurse Midwife

## 2016-09-17 VITALS — BP 101/68 | HR 92 | Temp 97.6°F | Wt 114.2 lb

## 2016-09-17 DIAGNOSIS — Z3400 Encounter for supervision of normal first pregnancy, unspecified trimester: Secondary | ICD-10-CM

## 2016-09-17 DIAGNOSIS — Z363 Encounter for antenatal screening for malformations: Secondary | ICD-10-CM | POA: Diagnosis not present

## 2016-09-17 DIAGNOSIS — Z3402 Encounter for supervision of normal first pregnancy, second trimester: Secondary | ICD-10-CM

## 2016-09-17 DIAGNOSIS — Z3A18 18 weeks gestation of pregnancy: Secondary | ICD-10-CM | POA: Diagnosis not present

## 2016-09-17 NOTE — Patient Instructions (Addendum)
[facility] [place] [address], [address] [place], [place]  [address] Phone - [phone]   Fax - [phone]  [facility] [address] [place], [place] [address] Phone - [phone]   Fax - [place] [address] [place], [place]  [address] Phone - [phone]   Fax - [phone]  [place] [address], [place] [address] [place], [place]  [address] Phone - [phone]   Fax - [phone]  [place] [address] [place], [place]  [address] Phone - [phone]   Fax - [phone]  [facility] [address] [place], [place]  [address] Phone - [phone]   Fax - [place] [address], [place], [place]  [address] Phone - [phone]   Fax - [phone]  [place] [address] [place], [place] [address] [place], [place]  [address] Phone - [phone]   Fax - [place] [address] [place], [place]  [address] Phone - [phone]   Fax - [phone] [facility] [place]. [address] [place], [place]  [address] Phone - [phone]   Fax - [phone]  [place] [address] [place], [place]  [address] Phone - [phone]   Fax - [phone]  [facility] AT [place], [place], [place]  [address] Phone - [phone]   Fax - [place] [address] [place], [place]  [address] Phone - [phone]   Fax - [phone]  [facility] [address] [place], [place]  [address] Phone - [place], [place]  [address] Phone - [phone]   Fax - [place] [address], [place], [place]  [address] Phone - [phone]   Fax - [phone]  [place] [address] [place], [place]  [address] Phone - [phone]   Fax - [place], [place]  [address] Phone - [phone]   Fax - [place], [place], [place]  [address] Phone - [phone]   Fax - [place] [address] [place], [place]  [address] Phone - [phone]   Fax - [phone]  [place] [address] [place], [place]  [address] Phone - [phone]   Fax - [place] [address], [place], [place]  [address] Phone - [phone]   Fax - [phone]  [place] [address] [place], [place]  [address] Phone - [phone]   Fax - [phone] [place] [address] [place], [place]  [address] Phone - [phone]   Fax - [place] [address] [place], [place]  [address] Phone - [phone]   Fax - [phone]  [place] [address] [place], [place]  [address] Phone - [phone]   Fax [phone]   Contraception Choices Contraception (birth control) is the use of any methods or devices to prevent pregnancy. Below are some methods to help avoid pregnancy. Hormonal methods  Contraceptive implant. This is a thin,  plastic tube containing progesterone hormone. It does not contain estrogen hormone. Your health care provider inserts the tube in the inner part of the upper arm. The tube can remain in place for up to 3 years. After 3 years, the implant must be removed. The implant prevents the ovaries from releasing an egg (ovulation), thickens the cervical mucus to prevent sperm from entering the uterus, and thins the lining of  the inside of the uterus.  Progesterone-only injections. These injections are given every 3 months by your health care provider to prevent pregnancy. This synthetic progesterone hormone stops the ovaries from releasing eggs. It also thickens cervical mucus and changes the uterine lining. This makes it harder for sperm to survive in the uterus.  Birth control pills. These pills contain estrogen and progesterone hormone. They work by preventing the ovaries from releasing eggs (ovulation). They also cause the cervical mucus to thicken, preventing the sperm from entering the uterus. Birth control pills are prescribed by a health care provider.Birth control pills can also be used to treat heavy periods.  Minipill. This type of birth control pill contains only the progesterone hormone. They are taken every day of each month and must be prescribed by your health care provider.  Birth control patch. The patch contains hormones similar to those in birth control pills. It must be changed once a week and is prescribed by a health care provider.  Vaginal ring. The ring contains hormones similar to those in birth control pills. It is left in the vagina for 3 weeks, removed for 1 week, and then a new one is put back in place. The patient must be comfortable inserting and removing the ring from the vagina.A health care provider's prescription is necessary.  Emergency contraception. Emergency contraceptives prevent pregnancy after unprotected sexual intercourse. This pill can be taken right after sex or up to 5 days after unprotected sex. It is most effective the sooner you take the pills after having sexual intercourse. Most emergency contraceptive pills are available without a prescription. Check with your pharmacist. Do not use emergency contraception as your only form of birth control. Barrier methods  Female condom. This is a thin sheath (latex or rubber) that is worn over the penis during sexual intercourse. It  can be used with spermicide to increase effectiveness.  Female condom. This is a soft, loose-fitting sheath that is put into the vagina before sexual intercourse.  Diaphragm. This is a soft, latex, dome-shaped barrier that must be fitted by a health care provider. It is inserted into the vagina, along with a spermicidal jelly. It is inserted before intercourse. The diaphragm should be left in the vagina for 6 to 8 hours after intercourse.  Cervical cap. This is a round, soft, latex or plastic cup that fits over the cervix and must be fitted by a health care provider. The cap can be left in place for up to 48 hours after intercourse.  Sponge. This is a soft, circular piece of polyurethane foam. The sponge has spermicide in it. It is inserted into the vagina after wetting it and before sexual intercourse.  Spermicides. These are chemicals that kill or block sperm from entering the cervix and uterus. They come in the form of creams, jellies, suppositories, foam, or tablets. They do not require a prescription. They are inserted into the vagina with an applicator before having sexual intercourse. The process must be repeated every time you have sexual intercourse. Intrauterine contraception  Intrauterine device (IUD). This is a  T-shaped device that is put in a woman's uterus during a menstrual period to prevent pregnancy. There are 2 types:  Copper IUD. This type of IUD is wrapped in copper wire and is placed inside the uterus. Copper makes the uterus and fallopian tubes produce a fluid that kills sperm. It can stay in place for 10 years.  Hormone IUD. This type of IUD contains the hormone progestin (synthetic progesterone). The hormone thickens the cervical mucus and prevents sperm from entering the uterus, and it also thins the uterine lining to prevent implantation of a fertilized egg. The hormone can weaken or kill the sperm that get into the uterus. It can stay in place for 3-5 years, depending on  which type of IUD is used. Permanent methods of contraception  Female tubal ligation. This is when the woman's fallopian tubes are surgically sealed, tied, or blocked to prevent the egg from traveling to the uterus.  Hysteroscopic sterilization. This involves placing a small coil or insert into each fallopian tube. Your doctor uses a technique called hysteroscopy to do the procedure. The device causes scar tissue to form. This results in permanent blockage of the fallopian tubes, so the sperm cannot fertilize the egg. It takes about 3 months after the procedure for the tubes to become blocked. You must use another form of birth control for these 3 months.  Female sterilization. This is when the female has the tubes that carry sperm tied off (vasectomy).This blocks sperm from entering the vagina during sexual intercourse. After the procedure, the man can still ejaculate fluid (semen). Natural planning methods  Natural family planning. This is not having sexual intercourse or using a barrier method (condom, diaphragm, cervical cap) on days the woman could become pregnant.  Calendar method. This is keeping track of the length of each menstrual cycle and identifying when you are fertile.  Ovulation method. This is avoiding sexual intercourse during ovulation.  Symptothermal method. This is avoiding sexual intercourse during ovulation, using a thermometer and ovulation symptoms.  Post-ovulation method. This is timing sexual intercourse after you have ovulated. Regardless of which type or method of contraception you choose, it is important that you use condoms to protect against the transmission of sexually transmitted infections (STIs). Talk with your health care provider about which form of contraception is most appropriate for you. This information is not intended to replace advice given to you by your health care provider. Make sure you discuss any questions you have with your health care  provider. Document Released: 06/23/2005 Document Revised: 11/29/2015 Document Reviewed: 12/16/2012 Elsevier Interactive Patient Education  2017 [employer].

## 2016-09-17 NOTE — Progress Notes (Signed)
Patient is in the office, reports good fetal movement. 

## 2016-09-17 NOTE — Progress Notes (Signed)
   PRENATAL VISIT NOTE  Subjective:  Michaela Williams is a 21 y.o. G1P0 at [redacted]w[redacted]d being seen today for ongoing prenatal care.  She is currently monitored for the following issues for this low-risk pregnancy and has Supervision of normal pregnancy, antepartum; Childhood asthma; History of anxiety; At risk for depression; and UTI in pregnancy, antepartum on her problem list.  Patient reports backache, no bleeding, no contractions, no cramping and no leaking.  Contractions: Not present. Vag. Bleeding: None.  Movement: Present. Denies leaking of fluid.   The following portions of the patient's history were reviewed and updated as appropriate: allergies, current medications, past family history, past medical history, past social history, past surgical history and problem list. Problem list updated.  Objective:   Vitals:   09/17/16 1443  BP: 101/68  Pulse: 92  Temp: 97.6 F (36.4 C)  Weight: 114 lb 3.2 oz (51.8 kg)    Fetal Status: Fetal Heart Rate (bpm): 151   Movement: Present     General:  Alert, oriented and cooperative. Patient is in no acute distress.  Skin: Skin is warm and dry. No rash noted.   Cardiovascular: Normal heart rate noted  Respiratory: Normal respiratory effort, no problems with respiration noted  Abdomen: Soft, gravid, appropriate for gestational age. Pain/Pressure: Present     Pelvic:  Cervical exam deferred        Extremities: Normal range of motion.  Edema: Trace  Mental Status: Normal mood and affect. Normal behavior. Normal judgment and thought content.   Assessment and Plan:  Pregnancy: G1P0 at [redacted]w[redacted]d  1. Supervision of normal first pregnancy, antepartum      Discussed homeopathic methods for the back pain.  - AFP, Serum, Open Spina Bifida - [place] MFM OB FOLLOW UP; Future - Hemoglobin A1c - Culture, OB Urine  Preterm labor symptoms and general obstetric precautions including but not limited to vaginal bleeding, contractions, leaking of fluid and fetal movement  were reviewed in detail with the patient. Please refer to After Visit Summary for other counseling recommendations.  Return in about 4 weeks (around 10/15/2016) for ROB.   Morene Crocker, CNM

## 2016-09-19 ENCOUNTER — Other Ambulatory Visit: Payer: Self-pay | Admitting: Certified Nurse Midwife

## 2016-09-19 LAB — CULTURE, OB URINE

## 2016-09-19 LAB — URINE CULTURE, OB REFLEX

## 2016-09-21 ENCOUNTER — Inpatient Hospital Stay (HOSPITAL_COMMUNITY)
Admission: AD | Admit: 2016-09-21 | Discharge: 2016-09-22 | Disposition: A | Discharge status: Home / Self Care | Payer: Managed Care, Other (non HMO) | Source: Ambulatory Visit | Attending: Obstetrics and Gynecology | Admitting: Obstetrics and Gynecology

## 2016-09-21 DIAGNOSIS — O26892 Other specified pregnancy related conditions, second trimester: Secondary | ICD-10-CM | POA: Insufficient documentation

## 2016-09-21 DIAGNOSIS — Z8659 Personal history of other mental and behavioral disorders: Secondary | ICD-10-CM

## 2016-09-21 DIAGNOSIS — Z3A18 18 weeks gestation of pregnancy: Secondary | ICD-10-CM | POA: Insufficient documentation

## 2016-09-21 DIAGNOSIS — Z9189 Other specified personal risk factors, not elsewhere classified: Secondary | ICD-10-CM

## 2016-09-21 DIAGNOSIS — O2340 Unspecified infection of urinary tract in pregnancy, unspecified trimester: Secondary | ICD-10-CM

## 2016-09-21 DIAGNOSIS — J45909 Unspecified asthma, uncomplicated: Secondary | ICD-10-CM

## 2016-09-21 DIAGNOSIS — M549 Dorsalgia, unspecified: Secondary | ICD-10-CM | POA: Insufficient documentation

## 2016-09-22 ENCOUNTER — Encounter (HOSPITAL_COMMUNITY): Payer: Self-pay

## 2016-09-22 DIAGNOSIS — Z3A18 18 weeks gestation of pregnancy: Secondary | ICD-10-CM | POA: Diagnosis not present

## 2016-09-22 DIAGNOSIS — O26892 Other specified pregnancy related conditions, second trimester: Secondary | ICD-10-CM | POA: Diagnosis not present

## 2016-09-22 DIAGNOSIS — M549 Dorsalgia, unspecified: Secondary | ICD-10-CM | POA: Diagnosis not present

## 2016-09-22 DIAGNOSIS — R102 Pelvic and perineal pain: Secondary | ICD-10-CM | POA: Diagnosis present

## 2016-09-22 LAB — URINALYSIS, ROUTINE W REFLEX MICROSCOPIC
BILIRUBIN URINE: NEGATIVE
Glucose, UA: NEGATIVE mg/dL
HGB URINE DIPSTICK: NEGATIVE
KETONES UR: NEGATIVE mg/dL
Leukocytes, UA: NEGATIVE
NITRITE: NEGATIVE
Protein, ur: NEGATIVE mg/dL
SPECIFIC GRAVITY, URINE: 1.02 (ref 1.005–1.030)
pH: 6 (ref 5.0–8.0)

## 2016-09-22 MED ORDER — CYCLOBENZAPRINE HCL 10 MG PO TABS: 10.0000 mg | ORAL_TABLET | Freq: Two times a day (BID) | ORAL | 0 refills | Status: DC | PRN

## 2016-09-22 NOTE — MAU Note (Signed)
Pt presents complaining of abdominal pain and back pain with urination that started 5 days ago. Hx of frequent UTI's. Denies vaginal bleeding. Some clear discharge. Is feeling baby move. Has not tried anything for the pain.

## 2016-09-22 NOTE — MAU Provider Note (Signed)
History   [id number]   Chief Complaint  Patient presents with  . Abdominal Pain    HPI Michaela Williams is a 21 y.o. female  G1P0 at [redacted]w[redacted]d IUP here with report of pelvic pain and back pain with urination.  Pain started 5 days ago.  Denies fever or chills.  No report of vaginal bleeding.   Upon review of records ultrasound on 09/19/16 nml, cervical length 4 cm.    Patient's last menstrual period was 05/14/2016.  OB History  Gravida Para Term Preterm AB Living  1            SAB TAB Ectopic Multiple Live Births               # Outcome Date GA Lbr Len/2nd Weight Sex Delivery Anes PTL Lv  1 Current               Past Medical History:  Diagnosis Date  . Anemia   . Asthma     Family History  Problem Relation Age of Onset  . Mental illness Mother   . Cancer Maternal Grandmother   . Hypertension Maternal Grandmother     Social History   Social History  . Marital status: Single    Spouse name: N/A  . Number of children: N/A  . Years of education: N/A   Social History Main Topics  . Smoking status: Never Smoker  . Smokeless tobacco: Never Used  . Alcohol use No  . Drug use: No  . Sexual activity: Yes   Other Topics Concern  . None   Social History Narrative  . None    Allergies  Allergen Reactions  . Abilify [Aripiprazole]   . Lexapro [Escitalopram]   . Lithium     No current facility-administered medications on file prior to encounter.    Current Outpatient Prescriptions on File Prior to Encounter  Medication Sig Dispense Refill  . Prenat-FeCbn-FeAspGl-FA-Omega (OB COMPLETE PETITE) 35-5-1-200 MG CAPS Take 1 tablet by mouth daily. 30 capsule 12  . Prenatal Vit-Fe Fumarate-FA (PRENATAL MULTIVITAMIN) TABS tablet Take 1 tablet by mouth daily at 12 noon.       Review of Systems  Constitutional: Negative for chills and fever.  Gastrointestinal: Negative for abdominal pain.  Genitourinary: Positive for dysuria and pelvic pain (with urination). Negative for  flank pain and vaginal bleeding.  Musculoskeletal: Positive for back pain.  Neurological: Negative for dizziness and headaches.  All other systems reviewed and are negative.    Physical Exam   Vitals:   09/22/16 0016  BP: 119/78  Pulse: 98  Resp: 18  Temp: 99.2 F (37.3 C)  TempSrc: Oral    Physical Exam  Constitutional: She is oriented to person, place, and time. She appears well-developed and well-nourished.  HENT:  Head: Normocephalic.  Neck: Normal range of motion. Neck supple.  Cardiovascular: Normal rate, regular rhythm and normal heart sounds.   Respiratory: Effort normal and breath sounds normal. No respiratory distress.  GI: Soft. There is no tenderness.  Genitourinary: No bleeding in the vagina. No vaginal discharge found.  Musculoskeletal: Normal range of motion. She exhibits no edema.  Neurological: She is alert and oriented to person, place, and time.  Skin: Skin is warm and dry.   Dilation: Closed Effacement (%): Thick Exam by:: Reina Fuse, CNM   MAU Course  Procedures  MDM Results for orders placed or performed during the hospital encounter of 09/21/16 (from the past 24 hour(s))  Urinalysis, Routine w reflex  microscopic     Status: None   Collection Time: 09/22/16 12:11 AM  Result Value Ref Range   Color, Urine YELLOW YELLOW   APPearance CLEAR CLEAR   Specific Gravity, Urine 1.020 1.005 - 1.030   pH 6.0 5.0 - 8.0   Glucose, UA NEGATIVE NEGATIVE mg/dL   Hgb urine dipstick NEGATIVE NEGATIVE   Bilirubin Urine NEGATIVE NEGATIVE   Ketones, ur NEGATIVE NEGATIVE mg/dL   Protein, ur NEGATIVE NEGATIVE mg/dL   Nitrite NEGATIVE NEGATIVE   Leukocytes, UA NEGATIVE NEGATIVE     Assessment and Plan  21 y.o. G1P0 at [redacted]w[redacted]d IUP  Back Pain in Pregnancy  Plan: Discharge home RX Flexeril Reviewed pregnancy precautions Follow-up as scheduled  Tarvaris Puglia N Karim, CNM 09/22/2016 1:20 AM

## 2016-09-23 LAB — URINE CULTURE: CULTURE: NO GROWTH

## 2016-10-01 LAB — AFP, SERUM, OPEN SPINA BIFIDA
AFP MOM: 0.94
AFP Value: 53.6 ng/mL
GEST. AGE ON COLLECTION DATE: 18 wk
Maternal Age At EDD: 22.3 years
OSBR RISK 1 IN: 10000
Test Results:: NEGATIVE
WEIGHT: 114 [lb_av]

## 2016-10-01 LAB — HEMOGLOBIN A1C
ESTIMATED AVERAGE GLUCOSE: 94 mg/dL
HEMOGLOBIN A1C: 4.9 % (ref 4.8–5.6)

## 2016-10-02 ENCOUNTER — Other Ambulatory Visit: Payer: Self-pay | Admitting: Certified Nurse Midwife

## 2016-10-02 DIAGNOSIS — Z3400 Encounter for supervision of normal first pregnancy, unspecified trimester: Secondary | ICD-10-CM

## 2016-10-15 ENCOUNTER — Ambulatory Visit (INDEPENDENT_AMBULATORY_CARE_PROVIDER_SITE_OTHER): Payer: Managed Care, Other (non HMO) | Admitting: Certified Nurse Midwife

## 2016-10-15 ENCOUNTER — Encounter: Payer: Self-pay | Admitting: Certified Nurse Midwife

## 2016-10-15 VITALS — BP 105/72 | HR 91 | Wt 119.8 lb

## 2016-10-15 DIAGNOSIS — Z3402 Encounter for supervision of normal first pregnancy, second trimester: Secondary | ICD-10-CM

## 2016-10-15 DIAGNOSIS — Z3400 Encounter for supervision of normal first pregnancy, unspecified trimester: Secondary | ICD-10-CM

## 2016-10-15 NOTE — Progress Notes (Signed)
   PRENATAL VISIT NOTE  Subjective:  Michaela Williams is a 21 y.o. G1P0 at [redacted]w[redacted]d being seen today for ongoing prenatal care.  She is currently monitored for the following issues for this low-risk pregnancy and has Supervision of normal pregnancy, antepartum; Childhood asthma; History of anxiety; At risk for depression; and UTI in pregnancy, antepartum on her problem list.  Patient reports lower abdominal pain at night that lasts for ten minute intervals.  Contractions: Irregular. Vag. Bleeding: None.  Movement: Present. Denies leaking of fluid.   The following portions of the patient's history were reviewed and updated as appropriate: allergies, current medications, past family history, past medical history, past social history, past surgical history and problem list. Problem list updated.  Objective:   Vitals:   10/15/16 1307  BP: 105/72  Pulse: 91  Weight: 119 lb 12.8 oz (54.3 kg)    Fetal Status: Fetal Heart Rate (bpm): 141 Fundal Height: 22 cm Movement: Present     General:  Alert, oriented and cooperative. Patient is in no acute distress.  Skin: Skin is warm and dry. No rash noted.   Cardiovascular: Normal heart rate noted  Respiratory: Normal respiratory effort, no problems with respiration noted  Abdomen: Soft, gravid, appropriate for gestational age. Pain/Pressure: Absent     Pelvic:  Cervical exam deferred        Extremities: Normal range of motion.  Edema: None  Mental Status: Normal mood and affect. Normal behavior. Normal judgment and thought content.   Assessment and Plan:  Pregnancy: G1P0 at [redacted]w[redacted]d  1. Supervision of normal first pregnancy, antepartum     Doing well.  Chronic constipation.    Preterm labor symptoms and general obstetric precautions including but not limited to vaginal bleeding, contractions, leaking of fluid and fetal movement were reviewed in detail with the patient. Please refer to After Visit Summary for other counseling recommendations.  Return in  about 4 weeks (around 11/12/2016) for ROB.   Morene Crocker, CNM

## 2016-10-15 NOTE — Progress Notes (Signed)
Patient is worried about taking the muscle relaxer. She has been having lower contractions twice daily last 10-20 minutes.

## 2016-10-20 ENCOUNTER — Ambulatory Visit (HOSPITAL_COMMUNITY)
Admission: RE | Admit: 2016-10-20 | Discharge: 2016-10-20 | Disposition: A | Discharge status: Home / Self Care | Payer: Managed Care, Other (non HMO) | Source: Ambulatory Visit | Attending: Certified Nurse Midwife | Admitting: Certified Nurse Midwife

## 2016-10-20 DIAGNOSIS — Z3A22 22 weeks gestation of pregnancy: Secondary | ICD-10-CM | POA: Insufficient documentation

## 2016-10-20 DIAGNOSIS — Z3400 Encounter for supervision of normal first pregnancy, unspecified trimester: Secondary | ICD-10-CM

## 2016-10-20 DIAGNOSIS — Z362 Encounter for other antenatal screening follow-up: Secondary | ICD-10-CM | POA: Diagnosis not present

## 2016-10-22 ENCOUNTER — Other Ambulatory Visit: Payer: Self-pay | Admitting: Certified Nurse Midwife

## 2016-10-22 DIAGNOSIS — Z3400 Encounter for supervision of normal first pregnancy, unspecified trimester: Secondary | ICD-10-CM

## 2016-11-12 ENCOUNTER — Telehealth: Payer: Self-pay | Admitting: *Deleted

## 2016-11-12 ENCOUNTER — Ambulatory Visit (INDEPENDENT_AMBULATORY_CARE_PROVIDER_SITE_OTHER): Payer: Managed Care, Other (non HMO) | Admitting: Certified Nurse Midwife

## 2016-11-12 ENCOUNTER — Encounter: Payer: Self-pay | Admitting: Certified Nurse Midwife

## 2016-11-12 VITALS — BP 102/68 | HR 108 | Wt 122.2 lb

## 2016-11-12 DIAGNOSIS — O2242 Hemorrhoids in pregnancy, second trimester: Secondary | ICD-10-CM

## 2016-11-12 DIAGNOSIS — Z3400 Encounter for supervision of normal first pregnancy, unspecified trimester: Secondary | ICD-10-CM

## 2016-11-12 DIAGNOSIS — Z3402 Encounter for supervision of normal first pregnancy, second trimester: Secondary | ICD-10-CM

## 2016-11-12 MED ORDER — HYDROCORTISONE ACETATE 25 MG RE SUPP
25.0000 mg | Freq: Two times a day (BID) | RECTAL | 0 refills | Status: DC
Start: 2016-11-12 — End: 2016-11-17

## 2016-11-12 MED ORDER — DOCUSATE SODIUM 100 MG PO CAPS: 100.0000 mg | ORAL_CAPSULE | Freq: Two times a day (BID) | ORAL | 1 refills | Status: DC

## 2016-11-12 NOTE — Progress Notes (Signed)
   PRENATAL VISIT NOTE  Subjective:  Michaela Williams is a 21 y.o. G1P0 at [redacted]w[redacted]d being seen today for ongoing prenatal care.  She is currently monitored for the following issues for this low-risk pregnancy and has Supervision of normal pregnancy, antepartum; Childhood asthma; History of anxiety; At risk for depression; UTI in pregnancy, antepartum; and Hemorrhoids during pregnancy in second trimester on her problem list.  Patient reports no bleeding, no contractions, no cramping, no leaking and hemorrhoids, discussed constipation prevention and treatments.  Contractions: Not present. Vag. Bleeding: None.  Movement: Present. Denies leaking of fluid.   The following portions of the patient's history were reviewed and updated as appropriate: allergies, current medications, past family history, past medical history, past social history, past surgical history and problem list. Problem list updated.  Objective:   Vitals:   11/12/16 1335  BP: 102/68  Pulse: (!) 108  Weight: 122 lb 3.2 oz (55.4 kg)    Fetal Status: Fetal Heart Rate (bpm): 142 Fundal Height: 26 cm Movement: Present     General:  Alert, oriented and cooperative. Patient is in no acute distress.  Skin: Skin is warm and dry. No rash noted.   Cardiovascular: Normal heart rate noted  Respiratory: Normal respiratory effort, no problems with respiration noted  Abdomen: Soft, gravid, appropriate for gestational age. Pain/Pressure: Absent     Pelvic:  Cervical exam deferred        Extremities: Normal range of motion.  Edema: Trace  Mental Status: Normal mood and affect. Normal behavior. Normal judgment and thought content.   Assessment and Plan:  Pregnancy: G1P0 at [redacted]w[redacted]d  1. Supervision of normal first pregnancy, antepartum        2. Hemorrhoids during pregnancy in second trimester       - hydrocortisone (ANUSOL-HC) 25 MG suppository; Place 1 suppository (25 mg total) rectally 2 (two) times daily.  Dispense: 12 suppository; Refill:  0 - docusate sodium (COLACE) 100 MG capsule; Take 1 capsule (100 mg total) by mouth 2 (two) times daily.  Dispense: 90 capsule; Refill: 1  Preterm labor symptoms and general obstetric precautions including but not limited to vaginal bleeding, contractions, leaking of fluid and fetal movement were reviewed in detail with the patient. Please refer to After Visit Summary for other counseling recommendations.  Return in about 2 weeks (around 11/26/2016) for ROB, 2 hr OGTT.   Morene Crocker, CNM

## 2016-11-12 NOTE — Telephone Encounter (Signed)
Fax from pharmacy regarding Hydrocort Rx. This is not covered by pt insurance. Is there an alternative that could be sent to determine coverage or Rx that is on [facility] $4 med list?  Please advise

## 2016-11-12 NOTE — Patient Instructions (Signed)
AREA PEDIATRIC/FAMILY PRACTICE PHYSICIANS  [facility] [address], [address] [place], [place]  [address] Phone - [phone]   Fax - [phone]  [facility] [address] [place], [place] [address] Phone - [phone]   Fax - [phone]  [facility] [address] [place], [place]  [address] Phone - [phone]   Fax - [phone]  [facility] [address] [place], [place]  [address] Phone - [phone]   Fax - [phone]  [facility] [address] [place], [place]  [address] Phone - [phone]   Fax - [phone]  [facility] [address] [place], [place]  [address] Phone - [phone]   Fax - [phone]  [facility] [address] [place], [place]  [address] Phone - [phone]   Fax - [phone]  [facility] [address], [address] [place], [place]  [address] Phone - [phone]   Fax - [phone]  [facility] [address] [place], [place]  [address] Phone - [phone]   Fax - [phone] [facility] [address] [place], [place]  [address] Phone - [phone]   Fax - [phone]  [facility] [address] [place], [place]  [address] Phone - [phone]   Fax - [phone]  [facility] [address] [place], [place]  [address] Phone - [phone]   Fax - [phone]  [facility] [address], [address] [place], [place]  [address] Phone - [phone]   Fax - [phone]  [facility] [address] [place], [place]  [address] Phone - [phone]  [facility] [address] [place], [place]  [address] Phone - [phone]   Fax - [phone]  [facility] [address] [place], [place]  [address] Phone - [phone]   Fax - [phone]  [facility] [address] [place], [place]  [address] Phone - [phone]   Fax - [phone]  [facility] [address] [place], [place]  [address] Phone - [phone]   Fax - [phone]   [facility] [address] [place], [place]  [address] Phone - [phone]   Fax - [phone]  [facility] [address] [place], [place]  [address] Phone - [phone]   Fax - [phone]  [facility] [address] [place], [place]  [address] Phone - [phone]   Fax - [phone]  [facility] [address] , [place]  [address] Phone - [phone]   Fax - [phone]  [facility] [address] , [place]  [address] Phone - [phone]   Fax - [phone] [facility] [address] [place], [place]  [address] Phone - [phone]   Fax - [phone]  [facility] [address] [place], [place]  [address] Phone - [phone]   Fax - [phone]  [facility] [address] [place], [place]  [address] Phone - [phone]   Fax - [phone]  [facility] Charlene Flemming MD [address] [place]  [address] Phone [phone]  Fax [phone]   

## 2016-11-12 NOTE — Progress Notes (Signed)
Patient reports good fetal movement, denies pain/contractions.

## 2016-11-16 ENCOUNTER — Encounter (HOSPITAL_COMMUNITY): Payer: Self-pay | Admitting: Certified Nurse Midwife

## 2016-11-16 ENCOUNTER — Inpatient Hospital Stay (HOSPITAL_COMMUNITY)
Admission: AD | Admit: 2016-11-16 | Discharge: 2016-11-17 | Disposition: A | Discharge status: Home / Self Care | Payer: Managed Care, Other (non HMO) | Source: Ambulatory Visit | Attending: Obstetrics & Gynecology | Admitting: Obstetrics & Gynecology

## 2016-11-16 DIAGNOSIS — N858 Other specified noninflammatory disorders of uterus: Secondary | ICD-10-CM

## 2016-11-16 DIAGNOSIS — O99012 Anemia complicating pregnancy, second trimester: Secondary | ICD-10-CM | POA: Diagnosis not present

## 2016-11-16 DIAGNOSIS — O26892 Other specified pregnancy related conditions, second trimester: Secondary | ICD-10-CM | POA: Insufficient documentation

## 2016-11-16 DIAGNOSIS — O99512 Diseases of the respiratory system complicating pregnancy, second trimester: Secondary | ICD-10-CM | POA: Diagnosis not present

## 2016-11-16 DIAGNOSIS — J45909 Unspecified asthma, uncomplicated: Secondary | ICD-10-CM | POA: Diagnosis not present

## 2016-11-16 DIAGNOSIS — R109 Unspecified abdominal pain: Secondary | ICD-10-CM | POA: Diagnosis not present

## 2016-11-16 DIAGNOSIS — R1030 Lower abdominal pain, unspecified: Secondary | ICD-10-CM | POA: Insufficient documentation

## 2016-11-16 DIAGNOSIS — O26852 Spotting complicating pregnancy, second trimester: Secondary | ICD-10-CM | POA: Diagnosis not present

## 2016-11-16 DIAGNOSIS — Z9189 Other specified personal risk factors, not elsewhere classified: Secondary | ICD-10-CM

## 2016-11-16 DIAGNOSIS — O2342 Unspecified infection of urinary tract in pregnancy, second trimester: Secondary | ICD-10-CM | POA: Diagnosis not present

## 2016-11-16 DIAGNOSIS — O9989 Other specified diseases and conditions complicating pregnancy, childbirth and the puerperium: Secondary | ICD-10-CM | POA: Diagnosis not present

## 2016-11-16 DIAGNOSIS — O4692 Antepartum hemorrhage, unspecified, second trimester: Secondary | ICD-10-CM | POA: Insufficient documentation

## 2016-11-16 DIAGNOSIS — N898 Other specified noninflammatory disorders of vagina: Secondary | ICD-10-CM

## 2016-11-16 DIAGNOSIS — Z3A26 26 weeks gestation of pregnancy: Secondary | ICD-10-CM | POA: Diagnosis not present

## 2016-11-16 DIAGNOSIS — O2340 Unspecified infection of urinary tract in pregnancy, unspecified trimester: Secondary | ICD-10-CM

## 2016-11-16 DIAGNOSIS — Z8659 Personal history of other mental and behavioral disorders: Secondary | ICD-10-CM

## 2016-11-16 DIAGNOSIS — N859 Noninflammatory disorder of uterus, unspecified: Secondary | ICD-10-CM

## 2016-11-16 LAB — URINALYSIS, ROUTINE W REFLEX MICROSCOPIC
BILIRUBIN URINE: NEGATIVE
Glucose, UA: NEGATIVE mg/dL
Hgb urine dipstick: NEGATIVE
KETONES UR: NEGATIVE mg/dL
LEUKOCYTES UA: NEGATIVE
NITRITE: NEGATIVE
Protein, ur: NEGATIVE mg/dL
Specific Gravity, Urine: 1.003 — ABNORMAL LOW (ref 1.005–1.030)
pH: 6 (ref 5.0–8.0)

## 2016-11-16 MED ORDER — NIFEDIPINE 10 MG PO CAPS
10.0000 mg | ORAL_CAPSULE | Freq: Once | ORAL | Status: AC
  Administered 2016-11-17: 10 mg via ORAL
  Filled 2016-11-16: qty 1

## 2016-11-16 MED ORDER — NIFEDIPINE 10 MG PO CAPS
10.0000 mg | ORAL_CAPSULE | Freq: Once | ORAL | Status: AC
  Administered 2016-11-16: 10 mg via ORAL
  Filled 2016-11-16: qty 1

## 2016-11-16 MED ORDER — LACTATED RINGERS IV SOLN
INTRAVENOUS | Status: DC
  Administered 2016-11-16: 22:00:00 via INTRAVENOUS

## 2016-11-16 NOTE — MAU Provider Note (Signed)
Chief Complaint:  Vaginal Bleeding   First Provider Initiated Contact with Patient 11/16/16 2159     HPI: Michaela Williams is a 21 y.o. G1P0 at 12w4dwho presents to maternity admissions reporting pain with contractions/cramps in lower abdomen as well as one episode of passing "a lot of blood" on toilet. States did not have any blood on underwear, but when on toilet passed a flow of blood like a period. No further bleeding afterward.  No history of any preterm labor or bleeding, but FOB states she "has cramped like this the whole pregnancy" (he tells me this just prior to discharge). She reports good fetal movement, denies LOF, vaginal itching/burning, urinary symptoms, h/a, dizziness, n/v, diarrhea, constipation or fever/chills.    Vaginal Bleeding  The patient's primary symptoms include pelvic pain and vaginal bleeding. The patient's pertinent negatives include no genital itching, genital lesions, genital odor or genital rash. This is a new problem. The current episode started today. The problem occurs constantly. The problem has been resolved. The pain is severe. The problem affects both sides. She is pregnant. Associated symptoms include abdominal pain. Pertinent negatives include no chills, constipation, diarrhea, fever, frequency, nausea or vomiting. The vaginal discharge was bloody. The vaginal bleeding is heavier than menses. She has not been passing clots. She has not been passing tissue. Nothing aggravates the symptoms. She has tried nothing for the symptoms.  Abdominal Pain  This is a recurrent problem. The current episode started today. The onset quality is gradual. The problem occurs intermittently. The problem has been unchanged. The pain is located in the suprapubic region, LLQ and RLQ. The pain is moderate. The quality of the pain is cramping. The abdominal pain does not radiate. Pertinent negatives include no constipation, diarrhea, fever, frequency, myalgias, nausea or vomiting. Nothing  aggravates the pain. The pain is relieved by nothing. She has tried nothing for the symptoms.    RN Note: Pt here with c/o vaginal bleeding and cramping.  Past Medical History: Past Medical History:  Diagnosis Date  . Anemia   . Asthma     Past obstetric history: OB History  Gravida Para Term Preterm AB Living  1            SAB TAB Ectopic Multiple Live Births               # Outcome Date GA Lbr Len/2nd Weight Sex Delivery Anes PTL Lv  1 Current               Past Surgical History: Past Surgical History:  Procedure Laterality Date  . NO PAST SURGERIES      Family History: Family History  Problem Relation Age of Onset  . Mental illness Mother   . Cancer Maternal Grandmother   . Hypertension Maternal Grandmother     Social History: Social History  Substance Use Topics  . Smoking status: Never Smoker  . Smokeless tobacco: Never Used  . Alcohol use No    Allergies:  Allergies  Allergen Reactions  . Abilify [Aripiprazole]   . Lexapro [Escitalopram]   . Lithium     Meds:  Prescriptions Prior to Admission  Medication Sig Dispense Refill Last Dose  . Prenat-FeCbn-FeAspGl-FA-Omega (OB COMPLETE PETITE) 35-5-1-200 MG CAPS Take 1 tablet by mouth daily. 30 capsule 12 11/16/2016 at Unknown time  . cyclobenzaprine (FLEXERIL) 10 MG tablet Take 1 tablet (10 mg total) by mouth 2 (two) times daily as needed for muscle spasms. (Patient not taking: Reported on 10/15/2016) 20  tablet 0 Not Taking  . docusate sodium (COLACE) 100 MG capsule Take 1 capsule (100 mg total) by mouth 2 (two) times daily. 90 capsule 1   . hydrocortisone (ANUSOL-HC) 25 MG suppository Place 1 suppository (25 mg total) rectally 2 (two) times daily. 12 suppository 0     I have reviewed patient's Past Medical Hx, Surgical Hx, Family Hx, Social Hx, medications and allergies.   ROS:  Review of Systems  Constitutional: Negative for chills and fever.  Gastrointestinal: Positive for abdominal pain. Negative  for constipation, diarrhea, nausea and vomiting.  Genitourinary: Positive for pelvic pain and vaginal bleeding. Negative for frequency.  Musculoskeletal: Negative for myalgias.   Other systems negative  Physical Exam  Patient Vitals for the past 24 hrs:  BP Temp Temp src Pulse Resp SpO2 Height Weight  11/16/16 2233 103/64 - - - - - - -  11/16/16 2113 108/67 98.4 F (36.9 C) Oral 95 20 99 % 5\' 1"  (1.549 m) 125 lb (56.7 kg)   Constitutional: Well-developed, well-nourished female in no acute distress.  Cardiovascular: normal rate and rhythm Respiratory: normal effort, clear to auscultation bilaterally GI: Abd soft, non-tender, gravid appropriate for gestational age.   No rebound or guarding. MS: Extremities nontender, no edema, normal ROM Neurologic: Alert and oriented x 4.  GU: Neg CVAT.  PELVIC EXAM: Cervix pink, visually closed, without lesion, scant white creamy discharge, vaginal walls and external genitalia normal  No sign of blood seen, no pink, red or tan discharge, only white seen. Bimanual exam: Cervix firm, posterior, neg CMT, uterus nontender, Fundal Height consistent with dates, adnexa without tenderness, enlargement, or mass  Dilation: Closed Effacement (%): Thick Exam by:: Hansel Feinstein CNM  FHT:  Baseline 145 , moderate variability, accelerations present, no decelerations Contractions: irritability each contraction is 10-30 seconds each   Labs: Results for orders placed or performed during the hospital encounter of 11/16/16 (from the past 24 hour(s))  Urinalysis, Routine w reflex microscopic     Status: Abnormal   Collection Time: 11/16/16  9:15 PM  Result Value Ref Range   Color, Urine STRAW (A) YELLOW   APPearance CLEAR CLEAR   Specific Gravity, Urine 1.003 (L) 1.005 - 1.030   pH 6.0 5.0 - 8.0   Glucose, UA NEGATIVE NEGATIVE mg/dL   Hgb urine dipstick NEGATIVE NEGATIVE   Bilirubin Urine NEGATIVE NEGATIVE   Ketones, ur NEGATIVE NEGATIVE mg/dL   Protein, ur  NEGATIVE NEGATIVE mg/dL   Nitrite NEGATIVE NEGATIVE   Leukocytes, UA NEGATIVE NEGATIVE   RBC / HPF 0-5 0 - 5 RBC/hpf   WBC, UA 0-5 0 - 5 WBC/hpf   Bacteria, UA MANY (A) NONE SEEN   Squamous Epithelial / LPF 0-5 (A) NONE SEEN   A/Positive/-- (01/10 1639)  Imaging:   MAU Course/MDM: I have ordered labs and reviewed results. Urine is dilute.  Will hydrate with IV fluid anyway for irritability NST reviewed and found to be reactive.  Will give Procardia for irritabiltiy  Consult Dr Roselie Awkward with presentation, exam findings and test results.  Treatments in MAU included Procardia and IV fluids She got 3 doses and had relief of pain .  Continued to have some irritability, but no further pain  Cervix was long and closed   No blood was seen on exam, possibly could have been hemorrhoid bleeding. No blood identified anywere on or near perineum  Assessment: Single IUP at [redacted]w[redacted]d Uterine irritability with no change to cervix Reported vaginal bleeding with no evidence of  it on exam Cervix long and closed UA clear but with "many bacteria", ? UTI  Plan: Discharge home Urine to culture, will not treat at this time Preterm Labor precautions and fetal kick counts Follow up in Office for prenatal visits and recheck of cervix  Encouraged to return here or to other Urgent Care/ED if she develops worsening of symptoms, increase in pain, fever, or other concerning symptoms.   Pt stable at time of discharge.  Hansel Feinstein CNM, MSN Certified Nurse-Midwife 11/16/2016 10:38 PM

## 2016-11-16 NOTE — MAU Note (Signed)
Pt here with c/o vaginal bleeding and cramping.

## 2016-11-16 NOTE — Telephone Encounter (Signed)
There is not a 4.00 rx that we can give her.  We will have to work with her insurance to figure out what to prescribe. Thanks.

## 2016-11-17 ENCOUNTER — Other Ambulatory Visit: Payer: Self-pay | Admitting: *Deleted

## 2016-11-17 DIAGNOSIS — O9989 Other specified diseases and conditions complicating pregnancy, childbirth and the puerperium: Secondary | ICD-10-CM

## 2016-11-17 DIAGNOSIS — R109 Unspecified abdominal pain: Secondary | ICD-10-CM | POA: Diagnosis not present

## 2016-11-17 DIAGNOSIS — O2242 Hemorrhoids in pregnancy, second trimester: Secondary | ICD-10-CM

## 2016-11-17 DIAGNOSIS — O26852 Spotting complicating pregnancy, second trimester: Secondary | ICD-10-CM

## 2016-11-17 MED ORDER — PRAMOXINE HCL 1 % RE FOAM: 1.0000 "application " | Freq: Three times a day (TID) | RECTAL | 0 refills | Status: DC | PRN

## 2016-11-17 MED ORDER — HYDROCORTISONE ACETATE 25 MG RE SUPP: 25.0000 mg | Freq: Two times a day (BID) | RECTAL | 0 refills | Status: DC

## 2016-11-17 MED ORDER — NIFEDIPINE 10 MG PO CAPS: 10.0000 mg | ORAL_CAPSULE | Freq: Once | ORAL | Status: DC

## 2016-11-17 NOTE — Telephone Encounter (Signed)
Rx change to Proctofoam per RDenney, CNM Call placed to pt to discuss ins coverage. LM on VM to call office.

## 2016-11-17 NOTE — Discharge Instructions (Signed)
Preterm Labor and Birth Information The normal length of a pregnancy is 39-41 weeks. Preterm labor is when labor starts before 37 completed weeks of pregnancy. What are the risk factors for preterm labor? Preterm labor is more likely to occur in women who:  Have certain infections during pregnancy such as a bladder infection, sexually transmitted infection, or infection inside the uterus (chorioamnionitis).  Have a shorter-than-normal cervix.  Have gone into preterm labor before.  Have had surgery on their cervix.  Are younger than age 31 or older than age 15.  Are African American.  Are pregnant with twins or multiple babies (multiple gestation).  Take street drugs or smoke while pregnant.  Do not gain enough weight while pregnant.  Became pregnant shortly after having been pregnant. What are the symptoms of preterm labor? Symptoms of preterm labor include:  Cramps similar to those that can happen during a menstrual period. The cramps may happen with diarrhea.  Pain in the abdomen or lower back.  Regular uterine contractions that may feel like tightening of the abdomen.  A feeling of increased pressure in the pelvis.  Increased watery or bloody mucus discharge from the vagina.  Water breaking (ruptured amniotic sac). Why is it important to recognize signs of preterm labor? It is important to recognize signs of preterm labor because babies who are born prematurely may not be fully developed. This can put them at an increased risk for:  Long-term (chronic) heart and lung problems.  Difficulty immediately after birth with regulating body systems, including blood sugar, body temperature, heart rate, and breathing rate.  Bleeding in the brain.  Cerebral palsy.  Learning difficulties.  Death. These risks are highest for babies who are born before 28 weeks of pregnancy. How is preterm labor treated? Treatment depends on the length of your pregnancy, your condition, and  the health of your baby. It may involve:  Having a stitch (suture) placed in your cervix to prevent your cervix from opening too early (cerclage).  Taking or being given medicines, such as:  Hormone medicines. These may be given early in pregnancy to help support the pregnancy.  Medicine to stop contractions.  Medicines to help mature the babys lungs. These may be prescribed if the risk of delivery is high.  Medicines to prevent your baby from developing cerebral palsy. If the labor happens before 34 weeks of pregnancy, you may need to stay in the hospital. What should I do if I think I am in preterm labor? If you think that you are going into preterm labor, call your health care provider right away. How can I prevent preterm labor in future pregnancies? To increase your chance of having a full-term pregnancy:  Do not use any tobacco products, such as cigarettes, chewing tobacco, and e-cigarettes. If you need help quitting, ask your health care provider.  Do not use street drugs or medicines that have not been prescribed to you during your pregnancy.  Talk with your health care provider before taking any herbal supplements, even if you have been taking them regularly.  Make sure you gain a healthy amount of weight during your pregnancy.  Watch for infection. If you think that you might have an infection, get it checked right away.  Make sure to tell your health care provider if you have gone into preterm labor before. This information is not intended to replace advice given to you by your health care provider. Make sure you discuss any questions you have with your health  care provider. Document Released: 09/13/2003 Document Revised: 12/04/2015 Document Reviewed: 11/14/2015 Elsevier Interactive Patient Education  2017 [employer].

## 2016-11-17 NOTE — Progress Notes (Signed)
Changed placed to Anusol Rx as not covered by insurance.

## 2016-11-18 LAB — CULTURE, OB URINE: CULTURE: NO GROWTH

## 2016-11-20 ENCOUNTER — Telehealth: Payer: Self-pay | Admitting: *Deleted

## 2016-11-20 NOTE — Telephone Encounter (Signed)
Pt states that she was recently seen at [facility] for cramping/bleeding. Pt would like to know if she should return to work. 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 Pt made aware that her work conditions should be discussed at her routine OB visits.  Pt states that she has not been having symptoms that sent her to [facility] on 11/16/16.  Pt states that she may have occasional cramping.  Pt denies any recent spotting/bleeding and other preterm signs. Review of pt chart. Note from MAU shows some irritability, no bleeding or cervical change.   Pt advised she should be able to work as no symptoms are present.  Pt was advised that if symptoms return or has any other preterm signs, she would need to not work and be seen at [facility].  Pt advised call with be routed to provider for further recommendations or changes to plan of care.  Please advise on any changes.

## 2016-11-21 ENCOUNTER — Other Ambulatory Visit: Payer: Self-pay | Admitting: Certified Nurse Midwife

## 2016-11-21 NOTE — Telephone Encounter (Signed)
LM on VM for pt to return call to office

## 2016-11-21 NOTE — Telephone Encounter (Signed)
Light work duties would be appropriate as long as she is not having any other symptoms of preterm labor: cramping, vaginal bleeding, pressure.  Her cervix was long the start of April.  She needs to really hydrate: 8 bottles of water.  IF she has cramping, full bottle of water, sit down/rest, and take 2 extra strength Tylenol.  If she continues to cramp then she will need to stop working.  Please emphasize this in detail.  Thank you.  R.Paylin Hailu CNM

## 2016-11-26 ENCOUNTER — Other Ambulatory Visit: Payer: Managed Care, Other (non HMO)

## 2016-11-26 ENCOUNTER — Other Ambulatory Visit (HOSPITAL_COMMUNITY)
Admission: RE | Admit: 2016-11-26 | Discharge: 2016-11-26 | Disposition: A | Discharge status: Home / Self Care | Payer: Managed Care, Other (non HMO) | Source: Ambulatory Visit | Attending: Obstetrics | Admitting: Obstetrics

## 2016-11-26 ENCOUNTER — Ambulatory Visit (INDEPENDENT_AMBULATORY_CARE_PROVIDER_SITE_OTHER): Payer: Managed Care, Other (non HMO) | Admitting: Obstetrics

## 2016-11-26 ENCOUNTER — Encounter: Payer: Self-pay | Admitting: Obstetrics

## 2016-11-26 ENCOUNTER — Other Ambulatory Visit: Payer: Self-pay | Admitting: Certified Nurse Midwife

## 2016-11-26 VITALS — BP 104/67 | HR 96 | Wt 125.6 lb

## 2016-11-26 DIAGNOSIS — B3731 Acute candidiasis of vulva and vagina: Secondary | ICD-10-CM

## 2016-11-26 DIAGNOSIS — M545 Low back pain, unspecified: Secondary | ICD-10-CM

## 2016-11-26 DIAGNOSIS — B373 Candidiasis of vulva and vagina: Secondary | ICD-10-CM

## 2016-11-26 DIAGNOSIS — N898 Other specified noninflammatory disorders of vagina: Secondary | ICD-10-CM | POA: Insufficient documentation

## 2016-11-26 DIAGNOSIS — O219 Vomiting of pregnancy, unspecified: Secondary | ICD-10-CM

## 2016-11-26 DIAGNOSIS — Z3403 Encounter for supervision of normal first pregnancy, third trimester: Secondary | ICD-10-CM

## 2016-11-26 DIAGNOSIS — F419 Anxiety disorder, unspecified: Secondary | ICD-10-CM

## 2016-11-26 LAB — POCT URINALYSIS DIPSTICK
BILIRUBIN UA: NEGATIVE
Blood, UA: NEGATIVE
Glucose, UA: NEGATIVE
KETONES UA: NEGATIVE
Leukocytes, UA: NEGATIVE
Nitrite, UA: NEGATIVE
PH UA: 6.5 (ref 5.0–8.0)
PROTEIN UA: NEGATIVE
SPEC GRAV UA: 1.02 (ref 1.010–1.025)
Urobilinogen, UA: 0.2 E.U./dL

## 2016-11-26 MED ORDER — HYDROXYZINE PAMOATE 25 MG PO CAPS: 25.0000 mg | ORAL_CAPSULE | Freq: Two times a day (BID) | ORAL | 2 refills | Status: DC | PRN

## 2016-11-26 MED ORDER — TERCONAZOLE 0.8 % VA CREA: 1.0000 | TOPICAL_CREAM | Freq: Every day | VAGINAL | 0 refills | Status: DC

## 2016-11-26 NOTE — Telephone Encounter (Signed)
Pt notified in office 

## 2016-11-26 NOTE — Addendum Note (Signed)
Addended by: Baltazar Najjar A on: 11/26/2016 12:48 PM   Modules accepted: Orders

## 2016-11-26 NOTE — Addendum Note (Signed)
Addended by: Tristan Schroeder D on: 11/26/2016 02:59 PM   Modules accepted: Orders

## 2016-11-26 NOTE — Progress Notes (Signed)
Patient reports good fetal movement. Pt also reports pelvic pain and pressure that started around 8am and is occurring every 2-3 minutes, denies bleeding.

## 2016-11-26 NOTE — Progress Notes (Signed)
Subjective:  KEYONA EMRICH is a 21 y.o. G1P0 at [redacted]w[redacted]d being seen today for ongoing prenatal care.  She is currently monitored for the following issues for this low-risk pregnancy and has Supervision of normal pregnancy, antepartum; Childhood asthma; History of anxiety; At risk for depression; UTI in pregnancy, antepartum; and Hemorrhoids during pregnancy in second trimester on her problem list.  Patient reports backache, contractions since early am, nausea and vomiting.  Contractions: Irritability. Vag. Bleeding: None.  Movement: Present. Denies leaking of fluid.   The following portions of the patient's history were reviewed and updated as appropriate: allergies, current medications, past family history, past medical history, past social history, past surgical history and problem list. Problem list updated.  Objective:   Vitals:   11/26/16 1042  BP: 104/67  Pulse: 96  Weight: 125 lb 9.6 oz (57 kg)    Fetal Status: Fetal Heart Rate (bpm): 140   Movement: Present     General:  Alert, oriented and cooperative. Patient is in no acute distress.  Skin: /  Skin is warm and dry. No rash noted.   Cardiovascular: Normal heart rate noted  Respiratory: Normal respiratory effort, no problems with respiration noted  Abdomen: Soft, gravid, appropriate for gestational age. Pain/Pressure: Present     Pelvic:  Cvx:  Long Closed / High  Extremities: Normal range of motion.  Edema: None  Mental Status: Normal mood and affect. Normal behavior. Normal judgment and thought content.   Urinalysis:      Assessment and Plan:  Pregnancy: G1P0 at [redacted]w[redacted]d  1. Encounter for supervision of normal first pregnancy in third trimester Rx: - Glucose, 1 hour gestational - HIV antibody - RPR - CBC - Fetal nonstress test; Future - Culture, OB Urine - Cervicovaginal ancillary only  2. Vaginal irritation Rx: - Cervicovaginal ancillary only  3. Anxiety Rx: - hydrOXYzine (VISTARIL) 25 MG capsule; Take 1 capsule  (25 mg total) by mouth 2 (two) times daily as needed for anxiety.  Dispense: 30 capsule; Refill: 2  4. Nausea and vomiting during pregnancy - fluids and dietary changes recommended  5. Acute midline low back pain without sciatica - Maternity Belt Rx  Preterm labor symptoms and general obstetric precautions including but not limited to vaginal bleeding, contractions, leaking of fluid and fetal movement were reviewed in detail with the patient. Please refer to After Visit Summary for other counseling recommendations.  Return in about 2 weeks (around 12/10/2016) for [place].   Shelly Bombard, MDPatient ID: [name], female   DOB: 12/25/1995, 21 y.o.   MRN: [id number]

## 2016-11-27 LAB — CBC
Hematocrit: 29.2 % — ABNORMAL LOW (ref 34.0–46.6)
Hemoglobin: 9.9 g/dL — ABNORMAL LOW (ref 11.1–15.9)
MCH: 32 pg (ref 26.6–33.0)
MCHC: 33.9 g/dL (ref 31.5–35.7)
MCV: 95 fL (ref 79–97)
PLATELETS: 246 10*3/uL (ref 150–379)
RBC: 3.09 x10E6/uL — ABNORMAL LOW (ref 3.77–5.28)
RDW: 13.3 % (ref 12.3–15.4)
WBC: 17.3 10*3/uL — AB (ref 3.4–10.8)

## 2016-11-27 LAB — GLUCOSE, 1 HOUR GESTATIONAL: GESTATIONAL DIABETES SCREEN: 85 mg/dL (ref 65–139)

## 2016-11-27 LAB — HIV ANTIBODY (ROUTINE TESTING W REFLEX): HIV SCREEN 4TH GENERATION: NONREACTIVE

## 2016-11-27 LAB — CERVICOVAGINAL ANCILLARY ONLY
BACTERIAL VAGINITIS: NEGATIVE
Candida vaginitis: POSITIVE — AB
TRICH (WINDOWPATH): NEGATIVE

## 2016-11-27 LAB — RPR: RPR Ser Ql: NONREACTIVE

## 2016-11-28 ENCOUNTER — Other Ambulatory Visit: Payer: Self-pay | Admitting: Obstetrics

## 2016-11-28 LAB — URINE CULTURE, OB REFLEX

## 2016-11-28 LAB — CULTURE, OB URINE

## 2016-12-10 ENCOUNTER — Encounter: Payer: Self-pay | Admitting: Certified Nurse Midwife

## 2016-12-10 ENCOUNTER — Ambulatory Visit (INDEPENDENT_AMBULATORY_CARE_PROVIDER_SITE_OTHER): Payer: Managed Care, Other (non HMO) | Admitting: Certified Nurse Midwife

## 2016-12-10 VITALS — BP 111/72 | HR 118 | Wt 131.0 lb

## 2016-12-10 DIAGNOSIS — Z3483 Encounter for supervision of other normal pregnancy, third trimester: Secondary | ICD-10-CM

## 2016-12-10 DIAGNOSIS — Z3400 Encounter for supervision of normal first pregnancy, unspecified trimester: Secondary | ICD-10-CM

## 2016-12-10 DIAGNOSIS — Z9189 Other specified personal risk factors, not elsewhere classified: Secondary | ICD-10-CM

## 2016-12-10 DIAGNOSIS — Z8659 Personal history of other mental and behavioral disorders: Secondary | ICD-10-CM

## 2016-12-10 NOTE — Progress Notes (Signed)
   PRENATAL VISIT NOTE  Subjective:  Michaela Williams is a 21 y.o. G1P0 at [redacted]w[redacted]d being seen today for ongoing prenatal care.  She is currently monitored for the following issues for this low-risk pregnancy and has Supervision of normal pregnancy, antepartum; Childhood asthma; History of anxiety; At risk for depression; UTI in pregnancy, antepartum; and Hemorrhoids during pregnancy in second trimester on her problem list.  Patient reports no complaints.  Contractions: Irregular. Vag. Bleeding: None.  Movement: Present. Denies leaking of fluid.   The following portions of the patient's history were reviewed and updated as appropriate: allergies, current medications, past family history, past medical history, past social history, past surgical history and problem list. Problem list updated.  Objective:   Vitals:   12/10/16 1417  BP: 111/72  Pulse: (!) 118  Weight: 131 lb (59.4 kg)    Fetal Status: Fetal Heart Rate (bpm): 145 Fundal Height: 28 cm Movement: Present     General:  Alert, oriented and cooperative. Patient is in no acute distress.  Skin: Skin is warm and dry. No rash noted.   Cardiovascular: Normal heart rate noted  Respiratory: Normal respiratory effort, no problems with respiration noted  Abdomen: Soft, gravid, appropriate for gestational age. Pain/Pressure: Absent     Pelvic:  Cervical exam deferred        Extremities: Normal range of motion.  Edema: Trace  Mental Status: Normal mood and affect. Normal behavior. Normal judgment and thought content.   Assessment and Plan:  Pregnancy: G1P0 at [redacted]w[redacted]d  1. Supervision of normal first pregnancy, antepartum      Doing well  2. History of anxiety     Not on any medication currently, is very anxious about labor/delivery.  Coping mechanisms and labor plans discussed.  3. At risk for depression     Keep monitoring, medications declined today  Preterm labor symptoms and general obstetric precautions including but not limited to  vaginal bleeding, contractions, leaking of fluid and fetal movement were reviewed in detail with the patient. Please refer to After Visit Summary for other counseling recommendations.  Return in about 2 weeks (around 12/24/2016) for ROB.   Morene Crocker, CNM

## 2016-12-10 NOTE — Progress Notes (Signed)
Patient reports good fetal movement with contractions that come and go occasionally.

## 2016-12-24 ENCOUNTER — Ambulatory Visit (INDEPENDENT_AMBULATORY_CARE_PROVIDER_SITE_OTHER): Payer: Managed Care, Other (non HMO) | Admitting: Certified Nurse Midwife

## 2016-12-24 VITALS — BP 108/73 | HR 103 | Wt 130.6 lb

## 2016-12-24 DIAGNOSIS — Z3482 Encounter for supervision of other normal pregnancy, second trimester: Secondary | ICD-10-CM

## 2016-12-24 DIAGNOSIS — Z8659 Personal history of other mental and behavioral disorders: Secondary | ICD-10-CM

## 2016-12-24 DIAGNOSIS — Z3400 Encounter for supervision of normal first pregnancy, unspecified trimester: Secondary | ICD-10-CM

## 2016-12-24 NOTE — Progress Notes (Signed)
Patient is not hungry she is nauseous- it is better without the PNV- but not completely gone.

## 2016-12-24 NOTE — Patient Instructions (Signed)
AREA PEDIATRIC/FAMILY PRACTICE PHYSICIANS  [facility] [address], [address] [place], [place]  [address] Phone - [phone]   Fax - [phone]  [facility] [address] [place], [place] [address] Phone - [phone]   Fax - [phone]  [facility] [address] [place], [place]  [address] Phone - [phone]   Fax - [phone]  [facility] [address] [place], [place]  [address] Phone - [phone]   Fax - [phone]  [facility] [address] [place], [place]  [address] Phone - [phone]   Fax - [phone]  [facility] [address] [place], [place]  [address] Phone - [phone]   Fax - [phone]  [facility] [address] [place], [place]  [address] Phone - [phone]   Fax - [phone]  [facility] [address] [place], [place]  [address] Phone - [phone]   Fax - [phone]  [facility] [address] [place], [place]  [address] Phone - [phone]   Fax - [phone] [facility] [address] [place], [place]  [address] Phone - [phone]   Fax - [phone]  [facility] [address] [place], [place]  [address] Phone - [phone]   Fax - [phone]  [facility] [address] [place], [place]  [address] Phone - [phone]   Fax - [phone]  [facility] [address] [place], [place]  [address] Phone - [phone]   Fax - [phone]  [facility] GOSR[facility] [address] [place], [place]  [address] Phone - [phone]  [facility] [address] [place], [place]  [address] Phone - [phone]   Fax - [phone]  [facility] [address] [place], [place]  [address] Phone - [phone]   Fax - [phone]  [facility] [address] [place], [place]  [address] Phone - [phone]   Fax - [phone]  [facility] [address] [place], [place]  [address] Phone - [phone]   Fax - [phone]   [facility] [address] [place], [place]  [address] Phone - [phone]   Fax - [phone]  [facility] [address] [place], [place]  [address] Phone - [phone]   Fax - [phone]  DAV[facility] RUBIN [address] [place], [place]  [address] Phone - [phone]   Fax - [phone]  [facility] [address] [place], [place]  [address] Phone - [phone]   Fax - [phone]  [facility] [address] [place], [place]  [address] Phone - [phone]   Fax - [phone] [facility] [address] [place], [place]  [address] Phone - [phone]   Fax - [phone]  [facility] [address] [place], [place]  [address] Phone - [phone]   Fax - [phone]   [facility] [address] [place],   [address] Phone - [phone]   Fax - [phone]  [facility] Charlene Flemming MD [address] [place]  [address] Phone [phone]  Fax [phone]   

## 2016-12-24 NOTE — Progress Notes (Signed)
   PRENATAL VISIT NOTE  Subjective:  Michaela Williams is a 21 y.o. G1P0 at [redacted]w[redacted]d being seen today for ongoing prenatal care.  She is currently monitored for the following issues for this low-risk pregnancy and has Supervision of normal pregnancy, antepartum; Childhood asthma; History of anxiety; At risk for depression; UTI in pregnancy, antepartum; and Hemorrhoids during pregnancy in second trimester on her problem list.  Patient reports no complaints.  Contractions: Not present. Vag. Bleeding: None.  Movement: Present. Denies leaking of fluid.   The following portions of the patient's history were reviewed and updated as appropriate: allergies, current medications, past family history, past medical history, past social history, past surgical history and problem list. Problem list updated.  Objective:   Vitals:   12/24/16 1319  BP: 108/73  Pulse: (!) 103  Weight: 130 lb 9.6 oz (59.2 kg)    Fetal Status: Fetal Heart Rate (bpm): 148 Fundal Height: 33 cm Movement: Present     General:  Alert, oriented and cooperative. Patient is in no acute distress.  Skin: Skin is warm and dry. No rash noted.   Cardiovascular: Normal heart rate noted  Respiratory: Normal respiratory effort, no problems with respiration noted  Abdomen: Soft, gravid, appropriate for gestational age. Pain/Pressure: Absent     Pelvic:  Cervical exam deferred        Extremities: Normal range of motion.  Edema: None  Mental Status: Normal mood and affect. Normal behavior. Normal judgment and thought content.   Assessment and Plan:  Pregnancy: G1P0 at [redacted]w[redacted]d  1. Supervision of normal first pregnancy, antepartum      Doing well  2. History of anxiety      Not on medications currently  Preterm labor symptoms and general obstetric precautions including but not limited to vaginal bleeding, contractions, leaking of fluid and fetal movement were reviewed in detail with the patient. Please refer to After Visit Summary for other  counseling recommendations.  Return in about 2 weeks (around 01/07/2017) for ROB.   Morene Crocker, CNM

## 2017-01-06 ENCOUNTER — Ambulatory Visit (INDEPENDENT_AMBULATORY_CARE_PROVIDER_SITE_OTHER): Payer: Managed Care, Other (non HMO) | Admitting: Certified Nurse Midwife

## 2017-01-06 VITALS — BP 106/70 | HR 96 | Wt 130.8 lb

## 2017-01-06 DIAGNOSIS — Z3403 Encounter for supervision of normal first pregnancy, third trimester: Secondary | ICD-10-CM

## 2017-01-06 DIAGNOSIS — Z3400 Encounter for supervision of normal first pregnancy, unspecified trimester: Secondary | ICD-10-CM

## 2017-01-06 NOTE — Progress Notes (Signed)
Concerns about no appetite.

## 2017-01-06 NOTE — Progress Notes (Signed)
   PRENATAL VISIT NOTE  Subjective:  Michaela Williams is a 21 y.o. G1P0 at [redacted]w[redacted]d being seen today for ongoing prenatal care.  She is currently monitored for the following issues for this low-risk pregnancy and has Supervision of normal pregnancy, antepartum; Childhood asthma; History of anxiety; At risk for depression; UTI in pregnancy, antepartum; and Hemorrhoids during pregnancy in second trimester on her problem list.  Patient reports nausea, no bleeding, no contractions, no cramping and no leaking.  Contractions: Not present. Vag. Bleeding: None.  Movement: Present. Denies leaking of fluid.   The following portions of the patient's history were reviewed and updated as appropriate: allergies, current medications, past family history, past medical history, past social history, past surgical history and problem list. Problem list updated.  Objective:   Vitals:   01/06/17 1352  BP: 106/70  Pulse: 96  Weight: 130 lb 12.8 oz (59.3 kg)    Fetal Status: Fetal Heart Rate (bpm): 140 Fundal Height: 32 cm Movement: Present     General:  Alert, oriented and cooperative. Patient is in no acute distress.  Skin: Skin is warm and dry. No rash noted.   Cardiovascular: Normal heart rate noted  Respiratory: Normal respiratory effort, no problems with respiration noted  Abdomen: Soft, gravid, appropriate for gestational age. Pain/Pressure: Absent     Pelvic:  Cervical exam deferred        Extremities: Normal range of motion.  Edema: None  Mental Status: Normal mood and affect. Normal behavior. Normal judgment and thought content.   Assessment and Plan:  Pregnancy: G1P0 at [redacted]w[redacted]d  1. Supervision of normal first pregnancy, antepartum      Lack of appetite, high caloric foods encouraged.    Preterm labor symptoms and general obstetric precautions including but not limited to vaginal bleeding, contractions, leaking of fluid and fetal movement were reviewed in detail with the patient. Please refer to  After Visit Summary for other counseling recommendations.  Return in about 2 weeks (around 01/20/2017) for ROB, GBS.   Morene Crocker, CNM

## 2017-01-17 ENCOUNTER — Inpatient Hospital Stay (HOSPITAL_COMMUNITY)
Admission: AD | Admit: 2017-01-17 | Discharge: 2017-01-17 | Disposition: A | Discharge status: Home / Self Care | Payer: Managed Care, Other (non HMO) | Source: Ambulatory Visit | Attending: Obstetrics and Gynecology | Admitting: Obstetrics and Gynecology

## 2017-01-17 ENCOUNTER — Encounter (HOSPITAL_COMMUNITY): Payer: Self-pay | Admitting: *Deleted

## 2017-01-17 DIAGNOSIS — O9989 Other specified diseases and conditions complicating pregnancy, childbirth and the puerperium: Secondary | ICD-10-CM | POA: Diagnosis not present

## 2017-01-17 DIAGNOSIS — Z8659 Personal history of other mental and behavioral disorders: Secondary | ICD-10-CM

## 2017-01-17 DIAGNOSIS — Z3A35 35 weeks gestation of pregnancy: Secondary | ICD-10-CM | POA: Diagnosis not present

## 2017-01-17 DIAGNOSIS — O2340 Unspecified infection of urinary tract in pregnancy, unspecified trimester: Secondary | ICD-10-CM

## 2017-01-17 DIAGNOSIS — Z9189 Other specified personal risk factors, not elsewhere classified: Secondary | ICD-10-CM

## 2017-01-17 DIAGNOSIS — O99613 Diseases of the digestive system complicating pregnancy, third trimester: Secondary | ICD-10-CM | POA: Insufficient documentation

## 2017-01-17 DIAGNOSIS — K219 Gastro-esophageal reflux disease without esophagitis: Secondary | ICD-10-CM | POA: Diagnosis not present

## 2017-01-17 DIAGNOSIS — R630 Anorexia: Secondary | ICD-10-CM | POA: Diagnosis present

## 2017-01-17 DIAGNOSIS — O212 Late vomiting of pregnancy: Secondary | ICD-10-CM | POA: Diagnosis not present

## 2017-01-17 DIAGNOSIS — O219 Vomiting of pregnancy, unspecified: Secondary | ICD-10-CM

## 2017-01-17 DIAGNOSIS — R109 Unspecified abdominal pain: Secondary | ICD-10-CM

## 2017-01-17 DIAGNOSIS — O2343 Unspecified infection of urinary tract in pregnancy, third trimester: Secondary | ICD-10-CM | POA: Insufficient documentation

## 2017-01-17 LAB — URINALYSIS, MICROSCOPIC (REFLEX)

## 2017-01-17 LAB — URINALYSIS, ROUTINE W REFLEX MICROSCOPIC
BILIRUBIN URINE: NEGATIVE
Glucose, UA: NEGATIVE mg/dL
HGB URINE DIPSTICK: NEGATIVE
Ketones, ur: 15 mg/dL — AB
NITRITE: NEGATIVE
PH: 6 (ref 5.0–8.0)
Protein, ur: NEGATIVE mg/dL

## 2017-01-17 MED ORDER — RANITIDINE HCL 300 MG PO TABS: 300.0000 mg | ORAL_TABLET | Freq: Every day | ORAL | 11 refills | Status: DC

## 2017-01-17 MED ORDER — ONDANSETRON 8 MG PO TBDP
8.0000 mg | ORAL_TABLET | Freq: Once | ORAL | Status: DC
  Filled 2017-01-17: qty 1

## 2017-01-17 NOTE — MAU Note (Signed)
Has been having serious problems with eating the past month.  Past 2 wks has gotten worse.  Now it is to the point that she isn't keeping anything down.  Past 3 days, she has only been drinking, juice type drinks.  Had an eating disorder when in jr high and high school.  Hasn't had any problems in 5 or 6 yrs.  Having massive migraines.

## 2017-01-17 NOTE — MAU Provider Note (Signed)
History     CSN: [id number]  Arrival date and time: 01/17/17 1816   First Provider Initiated Contact with Patient 01/17/17 1850      Chief Complaint  Patient presents with  . Emesis  . Abdominal Pain   HPI Michaela Williams is a 21 y.o. G1P0 at [redacted]w[redacted]d who presents complaining of a lack of appetite. She states this is an ongoing problem during the pregnancy but the last 3 days have been worse. She has only been able to drink smoothies, no solid foods. She reports feeling like her acid reflux has been worse. She denies any pain, vaginal bleeding, leaking of fluid. She reports good fetal movement.   OB History    Gravida Para Term Preterm AB Living   1             SAB TAB Ectopic Multiple Live Births                  Past Medical History:  Diagnosis Date  . Anemia   . Asthma     Past Surgical History:  Procedure Laterality Date  . NO PAST SURGERIES      Family History  Problem Relation Age of Onset  . Mental illness Mother   . Cancer Maternal Grandmother   . Hypertension Maternal Grandmother     Social History  Substance Use Topics  . Smoking status: Never Smoker  . Smokeless tobacco: Never Used  . Alcohol use No    Allergies:  Allergies  Allergen Reactions  . Abilify [Aripiprazole]   . Lexapro [Escitalopram]   . Lithium     Prescriptions Prior to Admission  Medication Sig Dispense Refill Last Dose  . hydrOXYzine (VISTARIL) 25 MG capsule Take 1 capsule (25 mg total) by mouth 2 (two) times daily as needed for anxiety. 30 capsule 2 Taking  . Prenat-FeCbn-FeAspGl-FA-Omega (OB COMPLETE PETITE) 35-5-1-200 MG CAPS Take 1 tablet by mouth daily. 30 capsule 12 Taking    Review of Systems  Constitutional: Negative.  Negative for chills and fever.  HENT: Negative.   Respiratory: Negative.  Negative for shortness of breath.   Cardiovascular: Negative.  Negative for chest pain.  Gastrointestinal: Positive for nausea. Negative for abdominal pain, constipation,  diarrhea and vomiting.  Genitourinary: Negative.  Negative for dysuria, vaginal bleeding and vaginal discharge.  Neurological: Negative.  Negative for dizziness and headaches.  Psychiatric/Behavioral: Negative.    Physical Exam   Blood pressure 111/71, pulse (!) 108, temperature 98.5 F (36.9 C), temperature source Oral, resp. rate 16, weight 134 lb 8 oz (61 kg), last menstrual period 05/14/2016, SpO2 100 %.  Physical Exam  Nursing note and vitals reviewed. Constitutional: She is oriented to person, place, and time. She appears well-developed and well-nourished.  HENT:  Head: Normocephalic and atraumatic.  Eyes: Conjunctivae are normal. No scleral icterus.  Cardiovascular: Normal rate, regular rhythm and normal heart sounds.   Respiratory: Effort normal and breath sounds normal. No respiratory distress.  GI: Soft. She exhibits no distension. There is no tenderness.  Neurological: She is alert and oriented to person, place, and time.  Skin: Skin is warm and dry.  Psychiatric: She has a normal mood and affect. Her behavior is normal. Judgment and thought content normal.   Fetal Tracing:  Baseline: 130 Variability: moderate  Accelerations: 15x15 Decelerations: none  Toco: none  MAU Course  Procedures Results for orders placed or performed during the hospital encounter of 01/17/17 (from the past 24 hour(s))  Urinalysis, Routine  w reflex microscopic     Status: Abnormal   Collection Time: 01/17/17  6:36 PM  Result Value Ref Range   Color, Urine YELLOW YELLOW   APPearance CLEAR CLEAR   Specific Gravity, Urine <1.005 (L) 1.005 - 1.030   pH 6.0 5.0 - 8.0   Glucose, UA NEGATIVE NEGATIVE mg/dL   Hgb urine dipstick NEGATIVE NEGATIVE   Bilirubin Urine NEGATIVE NEGATIVE   Ketones, ur 15 (A) NEGATIVE mg/dL   Protein, ur NEGATIVE NEGATIVE mg/dL   Nitrite NEGATIVE NEGATIVE   Leukocytes, UA MODERATE (A) NEGATIVE  Urinalysis, Microscopic (reflex)     Status: Abnormal   Collection  Time: 01/17/17  6:36 PM  Result Value Ref Range   RBC / HPF 0-5 0 - 5 RBC/hpf   WBC, UA 6-30 0 - 5 WBC/hpf   Bacteria, UA MANY (A) NONE SEEN   Squamous Epithelial / LPF 6-30 (A) NONE SEEN   MDM UA Reactive NST Patient refused nausea medication in MAU Assessment and Plan   1. [redacted] weeks gestation of pregnancy   2. UTI in pregnancy, antepartum   3. History of anxiety   4. At risk for depression   5. Nausea/vomiting in pregnancy    -Discharge patient home in stable condition -Prescription for zantac sent to patient's pharmacy -Lengthy discussion of food choices with lack of appetite and small frequent meals -[facility] with [facility] as [facility] for [facility] -Encouraged to return here or to other Urgent Care/ED if she develops worsening of symptoms, increase in pain, fever, or other concerning symptoms.   Len Blalock 01/17/2017, 7:23 PM

## 2017-01-17 NOTE — Discharge Instructions (Signed)
Gastroesophageal Reflux Disease, Adult Normally, food travels down the esophagus and stays in the stomach to be digested. However, when a person has gastroesophageal reflux disease (GERD), food and stomach acid move back up into the esophagus. When this happens, the esophagus becomes sore and inflamed. Over time, GERD can create small holes (ulcers) in the lining of the esophagus. What are the causes? This condition is caused by a problem with the muscle between the esophagus and the stomach (lower esophageal sphincter, or LES). Normally, the LES muscle closes after food passes through the esophagus to the stomach. When the LES is weakened or abnormal, it does not close properly, and that allows food and stomach acid to go back up into the esophagus. The LES can be weakened by certain dietary substances, medicines, and medical conditions, including:  Tobacco use.  Pregnancy.  Having a hiatal hernia.  Heavy alcohol use.  Certain foods and beverages, such as coffee, chocolate, onions, and peppermint.  What increases the risk? This condition is more likely to develop in:  People who have an increased body weight.  People who have connective tissue disorders.  People who use NSAID medicines.  What are the signs or symptoms? Symptoms of this condition include:  Heartburn.  Difficult or painful swallowing.  The feeling of having a lump in the throat.  Abitter taste in the mouth.  Bad breath.  Having a large amount of saliva.  Having an upset or bloated stomach.  Belching.  Chest pain.  Shortness of breath or wheezing.  Ongoing (chronic) cough or a night-time cough.  Wearing away of tooth enamel.  Weight loss.  Different conditions can cause chest pain. Make sure to see your health care provider if you experience chest pain. How is this diagnosed? Your health care provider will take a medical history and perform a physical exam. To determine if you have mild or severe  GERD, your health care provider may also monitor how you respond to treatment. You may also have other tests, including:  An endoscopy toexamine your stomach and esophagus with a small camera.  A test thatmeasures the acidity level in your esophagus.  A test thatmeasures how much pressure is on your esophagus.  A barium swallow or modified barium swallow to show the shape, size, and functioning of your esophagus.  How is this treated? The goal of treatment is to help relieve your symptoms and to prevent complications. Treatment for this condition may vary depending on how severe your symptoms are. Your health care provider may recommend:  Changes to your diet.  Medicine.  Surgery.  Follow these instructions at home: Diet  Follow a diet as recommended by your health care provider. This may involve avoiding foods and drinks such as: ? Coffee and tea (with or without caffeine). ? Drinks that containalcohol. ? Energy drinks and sports drinks. ? Carbonated drinks or sodas. ? Chocolate and cocoa. ? Peppermint and mint flavorings. ? Garlic and onions. ? Horseradish. ? Spicy and acidic foods, including peppers, chili powder, curry powder, vinegar, hot sauces, and barbecue sauce. ? Citrus fruit juices and citrus fruits, such as oranges, lemons, and limes. ? Tomato-based foods, such as red sauce, chili, salsa, and pizza with red sauce. ? Fried and fatty foods, such as donuts, french fries, potato chips, and high-fat dressings. ? High-fat meats, such as hot dogs and fatty cuts of red and white meats, such as rib eye steak, sausage, ham, and bacon. ? High-fat dairy items, such as whole milk,  butter, and cream cheese.  Eat small, frequent meals instead of large meals.  Avoid drinking large amounts of liquid with your meals.  Avoid eating meals during the 2-3 hours before bedtime.  Avoid lying down right after you eat.  Do not exercise right after you eat. General  instructions  Pay attention to any changes in your symptoms.  Take over-the-counter and prescription medicines only as told by your health care provider. Do not take aspirin, ibuprofen, or other NSAIDs unless your health care provider told you to do so.  Do not use any tobacco products, including cigarettes, chewing tobacco, and e-cigarettes. If you need help quitting, ask your health care provider.  Wear loose-fitting clothing. Do not wear anything tight around your waist that causes pressure on your abdomen.  Raise (elevate) the head of your bed 6 inches (15cm).  Try to reduce your stress, such as with yoga or meditation. If you need help reducing stress, ask your health care provider.  If you are overweight, reduce your weight to an amount that is healthy for you. Ask your health care provider for guidance about a safe weight loss goal.  Keep all follow-up visits as told by your health care provider. This is important. Contact a health care provider if:  You have new symptoms.  You have unexplained weight loss.  You have difficulty swallowing, or it hurts to swallow.  You have wheezing or a persistent cough.  Your symptoms do not improve with treatment.  You have a hoarse voice. Get help right away if:  You have pain in your arms, neck, jaw, teeth, or back.  You feel sweaty, dizzy, or light-headed.  You have chest pain or shortness of breath.  You vomit and your vomit looks like blood or coffee grounds.  You faint.  Your stool is bloody or black.  You cannot swallow, drink, or eat. This information is not intended to replace advice given to you by your health care provider. Make sure you discuss any questions you have with your health care provider. Document Released: 04/02/2005 Document Revised: 11/21/2015 Document Reviewed: 10/18/2014 Elsevier Interactive Patient Education  2017 [place] for Gastroesophageal Reflux Disease, Adult When you have  gastroesophageal reflux disease (GERD), the foods you eat and your eating habits are very important. Choosing the right foods can help ease the discomfort of GERD. Consider working with a diet and nutrition specialist (dietitian) to help you make healthy food choices. What general guidelines should I follow? Eating plan  Choose healthy foods low in fat, such as fruits, vegetables, whole grains, low-fat dairy products, and lean meat, fish, and poultry.  Eat frequent, small meals instead of three large meals each day. Eat your meals slowly, in a relaxed setting. Avoid bending over or lying down until 2-3 hours after eating.  Limit high-fat foods such as fatty meats or fried foods.  Limit your intake of oils, butter, and shortening to less than 8 teaspoons each day.  Avoid the following: ? Foods that cause symptoms. These may be different for different people. Keep a food diary to keep track of foods that cause symptoms. ? Alcohol. ? Drinking large amounts of liquid with meals. ? Eating meals during the 2-3 hours before bed.  Cook foods using methods other than frying. This may include baking, grilling, or broiling. Lifestyle   Maintain a healthy weight. Ask your health care provider what weight is healthy for you. If you need to lose weight, work with your health  care provider to do so safely.  Exercise for at least 30 minutes on 5 or more days each week, or as told by your health care provider.  Avoid wearing clothes that fit tightly around your waist and chest.  Do not use any products that contain nicotine or tobacco, such as cigarettes and e-cigarettes. If you need help quitting, ask your health care provider.  Sleep with the head of your bed raised. Use a wedge under the mattress or blocks under the bed frame to raise the head of the bed. What foods are not recommended? The items listed may not be a complete list. Talk with your dietitian about what dietary choices are best for  you. Grains Pastries or quick breads with added fat. [place] toast. Vegetables Deep fried vegetables. [place] fries. Any vegetables prepared with added fat. Any vegetables that cause symptoms. For some people this may include tomatoes and tomato products, chili peppers, onions and garlic, and horseradish. Fruits Any fruits prepared with added fat. Any fruits that cause symptoms. For some people this may include citrus fruits, such as oranges, grapefruit, pineapple, and lemons. Meats and other protein foods High-fat meats, such as fatty beef or pork, hot dogs, ribs, ham, sausage, salami and bacon. Fried meat or protein, including fried fish and fried chicken. Nuts and nut butters. Dairy Whole milk and chocolate milk. Sour cream. Cream. Ice cream. Cream cheese. Milk shakes. Beverages Coffee and tea, with or without caffeine. Carbonated beverages. Sodas. Energy drinks. Fruit juice made with acidic fruits (such as orange or grapefruit). Tomato juice. Alcoholic drinks. Fats and oils Butter. Margarine. Shortening. Ghee. Sweets and desserts Chocolate and cocoa. Donuts. Seasoning and other foods Pepper. Peppermint and spearmint. Any condiments, herbs, or seasonings that cause symptoms. For some people, this may include curry, hot sauce, or vinegar-based salad dressings. Summary  When you have gastroesophageal reflux disease (GERD), food and lifestyle choices are very important to help ease the discomfort of GERD.  Eat frequent, small meals instead of three large meals each day. Eat your meals slowly, in a relaxed setting. Avoid bending over or lying down until 2-3 hours after eating.  Limit high-fat foods such as fatty meat or fried foods. This information is not intended to replace advice given to you by your health care provider. Make sure you discuss any questions you have with your health care provider. Document Released: 06/23/2005 Document Revised: 06/24/2016 Document Reviewed:  06/24/2016 Elsevier Interactive Patient Education  2017 [employer].

## 2017-01-23 ENCOUNTER — Other Ambulatory Visit (HOSPITAL_COMMUNITY)
Admission: RE | Admit: 2017-01-23 | Discharge: 2017-01-23 | Disposition: A | Discharge status: Home / Self Care | Payer: Managed Care, Other (non HMO) | Source: Ambulatory Visit | Attending: Certified Nurse Midwife | Admitting: Certified Nurse Midwife

## 2017-01-23 ENCOUNTER — Ambulatory Visit (INDEPENDENT_AMBULATORY_CARE_PROVIDER_SITE_OTHER): Payer: Managed Care, Other (non HMO) | Admitting: Certified Nurse Midwife

## 2017-01-23 VITALS — BP 118/78 | HR 99 | Wt 135.2 lb

## 2017-01-23 DIAGNOSIS — Z62810 Personal history of physical and sexual abuse in childhood: Secondary | ICD-10-CM | POA: Insufficient documentation

## 2017-01-23 DIAGNOSIS — Z3403 Encounter for supervision of normal first pregnancy, third trimester: Secondary | ICD-10-CM | POA: Diagnosis not present

## 2017-01-23 DIAGNOSIS — Z113 Encounter for screening for infections with a predominantly sexual mode of transmission: Secondary | ICD-10-CM

## 2017-01-23 DIAGNOSIS — Z3A36 36 weeks gestation of pregnancy: Secondary | ICD-10-CM | POA: Diagnosis not present

## 2017-01-23 DIAGNOSIS — Z3400 Encounter for supervision of normal first pregnancy, unspecified trimester: Secondary | ICD-10-CM

## 2017-01-23 DIAGNOSIS — Z8659 Personal history of other mental and behavioral disorders: Secondary | ICD-10-CM

## 2017-01-23 NOTE — Progress Notes (Signed)
Patient reports good fetal movement with contractions and pressure that comes and goes.
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
## 2017-01-23 NOTE — Patient Instructions (Addendum)
Third Trimester of Pregnancy The third trimester is from week 28 through week 40 (months 7 through 9). The third trimester is a time when the unborn baby (fetus) is growing rapidly. At the end of the ninth month, the fetus is about 20 inches in length and weighs 6-10 pounds. Body changes during your third trimester Your body will continue to go through many changes during pregnancy. The changes vary from woman to woman. During the third trimester:  Your weight will continue to increase. You can expect to gain 25-35 pounds (11-16 kg) by the end of the pregnancy.  You may begin to get stretch marks on your hips, abdomen, and breasts.  You may urinate more often because the fetus is moving lower into your pelvis and pressing on your bladder.  You may develop or continue to have heartburn. This is caused by increased hormones that slow down muscles in the digestive tract.  You may develop or continue to have constipation because increased hormones slow digestion and cause the muscles that push waste through your intestines to relax.  You may develop hemorrhoids. These are swollen veins (varicose veins) in the rectum that can itch or be painful.  You may develop swollen, bulging veins (varicose veins) in your legs.  You may have increased body aches in the pelvis, back, or thighs. This is due to weight gain and increased hormones that are relaxing your joints.  You may have changes in your hair. These can include thickening of your hair, rapid growth, and changes in texture. Some women also have hair loss during or after pregnancy, or hair that feels dry or thin. Your hair will most likely return to normal after your baby is born.  Your breasts will continue to grow and they will continue to become tender. A yellow fluid (colostrum) may leak from your breasts. This is the first milk you are producing for your baby.  Your belly button may stick out.  You may notice more swelling in your hands,  face, or ankles.  You may have increased tingling or numbness in your hands, arms, and legs. The skin on your belly may also feel numb.  You may feel short of breath because of your expanding uterus.  You may have more problems sleeping. This can be caused by the size of your belly, increased need to urinate, and an increase in your body's metabolism.  You may notice the fetus "dropping," or moving lower in your abdomen (lightening).  You may have increased vaginal discharge.  You may notice your joints feel loose and you may have pain around your pelvic bone.  What to expect at prenatal visits You will have prenatal exams every 2 weeks until week 36. Then you will have weekly prenatal exams. During a routine prenatal visit:  You will be weighed to make sure you and the baby are growing normally.  Your blood pressure will be taken.  Your abdomen will be measured to track your baby's growth.  The fetal heartbeat will be listened to.  Any test results from the previous visit will be discussed.  You may have a cervical check near your due date to see if your cervix has softened or thinned (effaced).  You will be tested for Group B streptococcus. This happens between 35 and 37 weeks.  Your health care provider may ask you:  What your birth plan is.  How you are feeling.  If you are feeling the baby move.  If you have had   any abnormal symptoms, such as leaking fluid, bleeding, severe headaches, or abdominal cramping.  If you are using any tobacco products, including cigarettes, chewing tobacco, and electronic cigarettes.  If you have any questions.  Other tests or screenings that may be performed during your third trimester include:  Blood tests that check for low iron levels (anemia).  Fetal testing to check the health, activity level, and growth of the fetus. Testing is done if you have certain medical conditions or if there are problems during the  pregnancy.  Nonstress test (NST). This test checks the health of your baby to make sure there are no signs of problems, such as the baby not getting enough oxygen. During this test, a belt is placed around your belly. The baby is made to move, and its heart rate is monitored during movement.  What is false labor? False labor is a condition in which you feel small, irregular tightenings of the muscles in the womb (contractions) that usually go away with rest, changing position, or drinking water. These are called Braxton Hicks contractions. Contractions may last for hours, days, or even weeks before true labor sets in. If contractions come at regular intervals, become more frequent, increase in intensity, or become painful, you should see your health care provider. What are the signs of labor?  Abdominal cramps.  Regular contractions that start at 10 minutes apart and become stronger and more frequent with time.  Contractions that start on the top of the uterus and spread down to the lower abdomen and back.  Increased pelvic pressure and dull back pain.  A watery or bloody mucus discharge that comes from the vagina.  Leaking of amniotic fluid. This is also known as your "water breaking." It could be a slow trickle or a gush. Let your health care provider know if it has a color or strange odor. If you have any of these signs, call your health care provider right away, even if it is before your due date. Follow these instructions at home: Medicines  Follow your health care provider's instructions regarding medicine use. Specific medicines may be either safe or unsafe to take during pregnancy.  Take a prenatal vitamin that contains at least 600 micrograms (mcg) of folic acid.  If you develop constipation, try taking a stool softener if your health care provider approves. Eating and drinking  Eat a balanced diet that includes fresh fruits and vegetables, whole grains, good sources of protein  such as meat, eggs, or tofu, and low-fat dairy. Your health care provider will help you determine the amount of weight gain that is right for you.  Avoid raw meat and uncooked cheese. These carry germs that can cause birth defects in the baby.  If you have low calcium intake from food, talk to your health care provider about whether you should take a daily calcium supplement.  Eat four or five small meals rather than three large meals a day.  Limit foods that are high in fat and processed sugars, such as fried and sweet foods.  To prevent constipation: ? Drink enough fluid to keep your urine clear or pale yellow. ? Eat foods that are high in fiber, such as fresh fruits and vegetables, whole grains, and beans. Activity  Exercise only as directed by your health care provider. Most women can continue their usual exercise routine during pregnancy. Try to exercise for 30 minutes at least 5 days a week. Stop exercising if you experience uterine contractions.  Avoid heavy   lifting.  Do not exercise in extreme heat or humidity, or at high altitudes.  Wear low-heel, comfortable shoes.  Practice good posture.  You may continue to have sex unless your health care provider tells you otherwise. Relieving pain and discomfort  Take frequent breaks and rest with your legs elevated if you have leg cramps or low back pain.  Take warm sitz baths to soothe any pain or discomfort caused by hemorrhoids. Use hemorrhoid cream if your health care provider approves.  Wear a good support bra to prevent discomfort from breast tenderness.  If you develop varicose veins: ? Wear support pantyhose or compression stockings as told by your healthcare provider. ? Elevate your feet for 15 minutes, 3-4 times a day. Prenatal care  Write down your questions. Take them to your prenatal visits.  Keep all your prenatal visits as told by your health care provider. This is important. Safety  Wear your seat belt at  all times when driving.  Make a list of emergency phone numbers, including numbers for family, friends, the hospital, and police and fire departments. General instructions  Avoid cat litter boxes and soil used by cats. These carry germs that can cause birth defects in the baby. If you have a cat, ask someone to clean the litter box for you.  Do not travel far distances unless it is absolutely necessary and only with the approval of your health care provider.  Do not use hot tubs, steam rooms, or saunas.  Do not drink alcohol.  Do not use any products that contain nicotine or tobacco, such as cigarettes and e-cigarettes. If you need help quitting, ask your health care provider.  Do not use any medicinal herbs or unprescribed drugs. These chemicals affect the formation and growth of the baby.  Do not douche or use tampons or scented sanitary pads.  Do not cross your legs for long periods of time.  To prepare for the arrival of your baby: ? Take prenatal classes to understand, practice, and ask questions about labor and delivery. ? Make a trial run to the hospital. ? Visit the hospital and tour the maternity area. ? Arrange for maternity or paternity leave through employers. ? Arrange for family and friends to take care of pets while you are in the hospital. ? Purchase a rear-facing car seat and make sure you know how to install it in your car. ? Pack your hospital bag. ? Prepare the baby's nursery. Make sure to remove all pillows and stuffed animals from the baby's crib to prevent suffocation.  Visit your dentist if you have not gone during your pregnancy. Use a soft toothbrush to brush your teeth and be gentle when you floss. Contact a health care provider if:  You are unsure if you are in labor or if your water has broken.  You become dizzy.  You have mild pelvic cramps, pelvic pressure, or nagging pain in your abdominal area.  You have lower back pain.  You have persistent  nausea, vomiting, or diarrhea.  You have an unusual or bad smelling vaginal discharge.  You have pain when you urinate. Get help right away if:  Your water breaks before 37 weeks.  You have regular contractions less than 5 minutes apart before 37 weeks.  You have a fever.  You are leaking fluid from your vagina.  You have spotting or bleeding from your vagina.  You have severe abdominal pain or cramping.  You have rapid weight loss or weight gain.    You have shortness of breath with chest pain.  You notice sudden or extreme swelling of your face, hands, ankles, feet, or legs.  Your baby makes fewer than 10 movements in 2 hours.  You have severe headaches that do not go away when you take medicine.  You have vision changes. Summary  The third trimester is from week 28 through week 40, months 7 through 9. The third trimester is a time when the unborn baby (fetus) is growing rapidly.  During the third trimester, your discomfort may increase as you and your baby continue to gain weight. You may have abdominal, leg, and back pain, sleeping problems, and an increased need to urinate.  During the third trimester your breasts will keep growing and they will continue to become tender. A yellow fluid (colostrum) may leak from your breasts. This is the first milk you are producing for your baby.  False labor is a condition in which you feel small, irregular tightenings of the muscles in the womb (contractions) that eventually go away. These are called Braxton Hicks contractions. Contractions may last for hours, days, or even weeks before true labor sets in.  Signs of labor can include: abdominal cramps; regular contractions that start at 10 minutes apart and become stronger and more frequent with time; watery or bloody mucus discharge that comes from the vagina; increased pelvic pressure and dull back pain; and leaking of amniotic fluid. This information is not intended to replace advice  given to you by your health care provider. Make sure you discuss any questions you have with your health care provider. Document Released: 06/17/2001 Document Revised: 11/29/2015 Document Reviewed: 08/24/2012 Elsevier Interactive Patient Education  2017 Else[employer] Inc[employer]  Before Baby Comes Home Before your baby arrives it is important to:  Have all of the supplies that you will need to care for your baby.  Know where to go if there is an emergency.  Discuss the baby's arrival with other family members.  What supplies will I need?  It is recommended that you have the following supplies: Large Items  Crib.  Crib mattress.  Rear-facing infant car seat. If possible, have a trained professional check to make sure that it is installed correctly.  Feeding  6-8 bottles that are 4-5 oz in size.  6-8 nipples.  Bottle brush.  Sterilizer, or a large pan or kettle with a lid.  A way to boil and cool water.  If you will be breastfeeding: ? Breast pump. ? Nipple cream. ? Nursing bra. ? Breast pads. ? Breast shields.  If you will be formula feeding: ? Formula. ? Measuring cups. ? Measuring spoons.  Bathing  Mild baby soap and baby shampoo.  Petroleum jelly.  Soft cloth towel and washcloth.  Hooded towel.  Cotton balls.  Bath basin.  Other Supplies  Rectal thermometer.  Bulb syringe.  Baby wipes or washcloths for diaper changes.  Diaper bag.  Changing pad.  Clothing, including one-piece outfits and pajamas.  Baby nail clippers.  Receiving blankets.  Mattress pad and sheets for the crib.  Night-light for the baby's room.  Baby monitor.  2 or 3 pacifiers.  Either 24-36 cloth diapers and waterproof diaper covers or a box of disposable diapers. You may need to use as many as 10-12 diapers per day.  How do I prepare for an emergency? Prepare for an emergency by:  Knowing how to get to the nearest hospital.  Listing the phone numbers of your baby's  health care providers   near your home phone and in your cell phone.  How do I prepare my family?  Decide how to handle visitors.  If you have other children: ? Talk with them about the baby coming home. Ask them how they feel about it. ? Read a book together about being a new big brother or sister. ? Find ways to let them help you prepare for the new baby. ? Have someone ready to care for them while you are in the hospital. This information is not intended to replace advice given to you by your health care provider. Make sure you discuss any questions you have with your health care provider. Document Released: 06/05/2008 Document Revised: 11/29/2015 Document Reviewed: 05/31/2014 Elsevier Interactive Patient Education  2018 Elsevier Inc.  

## 2017-01-25 LAB — STREP GP B NAA: Strep Gp B NAA: NEGATIVE

## 2017-01-27 LAB — CERVICOVAGINAL ANCILLARY ONLY
BACTERIAL VAGINITIS: NEGATIVE
Candida vaginitis: NEGATIVE
Chlamydia: NEGATIVE
NEISSERIA GONORRHEA: NEGATIVE
TRICH (WINDOWPATH): NEGATIVE

## 2017-02-02 ENCOUNTER — Ambulatory Visit (INDEPENDENT_AMBULATORY_CARE_PROVIDER_SITE_OTHER): Payer: Managed Care, Other (non HMO) | Admitting: Certified Nurse Midwife

## 2017-02-02 VITALS — BP 109/76 | HR 99 | Wt 138.1 lb

## 2017-02-02 DIAGNOSIS — Z23 Encounter for immunization: Secondary | ICD-10-CM | POA: Diagnosis not present

## 2017-02-02 DIAGNOSIS — Z3403 Encounter for supervision of normal first pregnancy, third trimester: Secondary | ICD-10-CM

## 2017-02-02 DIAGNOSIS — Z8659 Personal history of other mental and behavioral disorders: Secondary | ICD-10-CM

## 2017-02-02 DIAGNOSIS — Z62810 Personal history of physical and sexual abuse in childhood: Secondary | ICD-10-CM

## 2017-02-02 DIAGNOSIS — Z3400 Encounter for supervision of normal first pregnancy, unspecified trimester: Secondary | ICD-10-CM
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
## 2017-02-02 NOTE — Addendum Note (Signed)
Addended by: Tristan Schroeder D on: 02/02/2017 03:17 PM   Modules accepted: Orders

## 2017-02-02 NOTE — Progress Notes (Signed)
Patient reports good fetal movement, denies pain. 

## 2017-02-03 ENCOUNTER — Inpatient Hospital Stay (HOSPITAL_COMMUNITY)
Admission: AD | Admit: 2017-02-03 | Discharge: 2017-02-06 | DRG: 775 | Disposition: A | Discharge status: Home / Self Care | Payer: 59 | Source: Ambulatory Visit | Attending: Family Medicine | Admitting: Family Medicine

## 2017-02-03 ENCOUNTER — Encounter (HOSPITAL_COMMUNITY): Payer: Self-pay | Admitting: *Deleted

## 2017-02-03 ENCOUNTER — Inpatient Hospital Stay (HOSPITAL_COMMUNITY): Payer: 59 | Admitting: Anesthesiology

## 2017-02-03 DIAGNOSIS — F329 Major depressive disorder, single episode, unspecified: Secondary | ICD-10-CM | POA: Diagnosis present

## 2017-02-03 DIAGNOSIS — F419 Anxiety disorder, unspecified: Secondary | ICD-10-CM | POA: Diagnosis present

## 2017-02-03 DIAGNOSIS — Z9189 Other specified personal risk factors, not elsewhere classified: Secondary | ICD-10-CM

## 2017-02-03 DIAGNOSIS — Z3400 Encounter for supervision of normal first pregnancy, unspecified trimester: Secondary | ICD-10-CM

## 2017-02-03 DIAGNOSIS — O99344 Other mental disorders complicating childbirth: Secondary | ICD-10-CM | POA: Diagnosis present

## 2017-02-03 DIAGNOSIS — O2242 Hemorrhoids in pregnancy, second trimester: Secondary | ICD-10-CM

## 2017-02-03 DIAGNOSIS — Z8659 Personal history of other mental and behavioral disorders: Secondary | ICD-10-CM

## 2017-02-03 DIAGNOSIS — Z3A37 37 weeks gestation of pregnancy: Secondary | ICD-10-CM

## 2017-02-03 DIAGNOSIS — Z880 Allergy status to penicillin: Secondary | ICD-10-CM

## 2017-02-03 DIAGNOSIS — Z87891 Personal history of nicotine dependence: Secondary | ICD-10-CM | POA: Diagnosis not present

## 2017-02-03 DIAGNOSIS — O2340 Unspecified infection of urinary tract in pregnancy, unspecified trimester: Secondary | ICD-10-CM

## 2017-02-03 DIAGNOSIS — Z62810 Personal history of physical and sexual abuse in childhood: Secondary | ICD-10-CM

## 2017-02-03 DIAGNOSIS — Z3493 Encounter for supervision of normal pregnancy, unspecified, third trimester: Secondary | ICD-10-CM | POA: Diagnosis present

## 2017-02-03 HISTORY — DX: Unspecified convulsions: R56.9

## 2017-02-03 HISTORY — DX: Unspecified infectious disease: B99.9

## 2017-02-03 HISTORY — DX: Headache: R51

## 2017-02-03 HISTORY — DX: Major depressive disorder, single episode, unspecified: F32.9

## 2017-02-03 HISTORY — DX: Depression, unspecified: F32.A

## 2017-02-03 HISTORY — DX: Headache, unspecified: R51.9

## 2017-02-03 LAB — CBC
HCT: 30.1 % — ABNORMAL LOW (ref 36.0–46.0)
Hemoglobin: 9.9 g/dL — ABNORMAL LOW (ref 12.0–15.0)
MCH: 29.6 pg (ref 26.0–34.0)
MCHC: 32.9 g/dL (ref 30.0–36.0)
MCV: 90.1 fL (ref 78.0–100.0)
Platelets: 209 10*3/uL (ref 150–400)
RBC: 3.34 MIL/uL — ABNORMAL LOW (ref 3.87–5.11)
RDW: 13.8 % (ref 11.5–15.5)
WBC: 15.6 10*3/uL — ABNORMAL HIGH (ref 4.0–10.5)

## 2017-02-03 LAB — TYPE AND SCREEN
ABO/RH(D): A POS
Antibody Screen: NEGATIVE

## 2017-02-03 LAB — ABO/RH: ABO/RH(D): A POS

## 2017-02-03 MED ORDER — FENTANYL 2.5 MCG/ML BUPIVACAINE 1/10 % EPIDURAL INFUSION (WH - ANES)
14.0000 mL/h | INTRAMUSCULAR | Status: DC | PRN
  Administered 2017-02-03 – 2017-02-04 (×2): 14 mL/h via EPIDURAL
  Filled 2017-02-03 (×2): qty 100

## 2017-02-03 MED ORDER — LIDOCAINE HCL (PF) 1 % IJ SOLN
INTRAMUSCULAR | Status: DC | PRN
  Administered 2017-02-03: 6 mL via EPIDURAL
  Administered 2017-02-03: 7 mL via EPIDURAL

## 2017-02-03 MED ORDER — PHENYLEPHRINE 40 MCG/ML (10ML) SYRINGE FOR IV PUSH (FOR BLOOD PRESSURE SUPPORT): 80.0000 ug | PREFILLED_SYRINGE | INTRAVENOUS | Status: DC | PRN

## 2017-02-03 MED ORDER — EPHEDRINE 5 MG/ML INJ
10.0000 mg | INTRAVENOUS | Status: DC | PRN
  Filled 2017-02-03: qty 2

## 2017-02-03 MED ORDER — DIPHENHYDRAMINE HCL 50 MG/ML IJ SOLN
12.5000 mg | INTRAMUSCULAR | Status: DC | PRN
  Administered 2017-02-03 (×2): 12.5 mg via INTRAVENOUS
  Filled 2017-02-03: qty 1

## 2017-02-03 MED ORDER — OXYCODONE-ACETAMINOPHEN 5-325 MG PO TABS: 1.0000 | ORAL_TABLET | ORAL | Status: DC | PRN

## 2017-02-03 MED ORDER — LIDOCAINE HCL (PF) 1 % IJ SOLN
30.0000 mL | INTRAMUSCULAR | Status: DC | PRN
  Filled 2017-02-03: qty 30

## 2017-02-03 MED ORDER — OXYTOCIN 40 UNITS IN LACTATED RINGERS INFUSION - SIMPLE MED
2.5000 [IU]/h | INTRAVENOUS | Status: DC
  Administered 2017-02-04: 2.5 [IU]/h via INTRAVENOUS
  Filled 2017-02-03: qty 1000

## 2017-02-03 MED ORDER — LACTATED RINGERS IV SOLN
500.0000 mL | Freq: Once | INTRAVENOUS | Status: AC
  Administered 2017-02-03: 500 mL via INTRAVENOUS

## 2017-02-03 MED ORDER — OXYTOCIN BOLUS FROM INFUSION
500.0000 mL | Freq: Once | INTRAVENOUS | Status: AC
  Administered 2017-02-04: 500 mL via INTRAVENOUS

## 2017-02-03 MED ORDER — ONDANSETRON HCL 4 MG/2ML IJ SOLN
4.0000 mg | Freq: Four times a day (QID) | INTRAMUSCULAR | Status: DC | PRN
  Administered 2017-02-03 – 2017-02-04 (×2): 4 mg via INTRAVENOUS
  Filled 2017-02-03 (×2): qty 2

## 2017-02-03 MED ORDER — PHENYLEPHRINE 40 MCG/ML (10ML) SYRINGE FOR IV PUSH (FOR BLOOD PRESSURE SUPPORT)
80.0000 ug | PREFILLED_SYRINGE | INTRAVENOUS | Status: DC | PRN
  Administered 2017-02-03 (×2): 80 ug via INTRAVENOUS
  Filled 2017-02-03: qty 5
  Filled 2017-02-03: qty 10

## 2017-02-03 MED ORDER — LACTATED RINGERS IV SOLN
INTRAVENOUS | Status: DC
  Administered 2017-02-03: 17:00:00 via INTRAVENOUS

## 2017-02-03 MED ORDER — SOD CITRATE-CITRIC ACID 500-334 MG/5ML PO SOLN: 30.0000 mL | ORAL | Status: DC | PRN

## 2017-02-03 MED ORDER — ACETAMINOPHEN 325 MG PO TABS: 650.0000 mg | ORAL_TABLET | ORAL | Status: DC | PRN

## 2017-02-03 MED ORDER — FENTANYL CITRATE (PF) 100 MCG/2ML IJ SOLN
50.0000 ug | INTRAMUSCULAR | Status: DC | PRN
  Administered 2017-02-03: 50 ug via INTRAVENOUS
  Filled 2017-02-03: qty 2

## 2017-02-03 MED ORDER — OXYCODONE-ACETAMINOPHEN 5-325 MG PO TABS: 2.0000 | ORAL_TABLET | ORAL | Status: DC | PRN

## 2017-02-03 MED ORDER — LACTATED RINGERS IV SOLN: 500.0000 mL | INTRAVENOUS | Status: DC | PRN
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
## 2017-02-03 NOTE — Progress Notes (Signed)
Patient ID: [name], female   DOB: 01-04-1996, 21 y.o.   MRN: [id number] Labor Progress Note  S: Patient seen & examined for progress of labor. Patient comfortable with epidural, sleeping.   O: BP 102/66   Pulse 70   Temp 98.4 F (36.9 C) (Axillary)   Resp 18   Ht 5\' 1"  (1.549 m)   Wt 62.3 kg (137 lb 4 oz)   LMP 05/14/2016   SpO2 100%   BMI 25.93 kg/m   FHT: 120bpm, mod var, +accels, no decels TOCO: q1-6min, patient looks comfortable during contractions  CVE: Dilation: 6 Effacement (%): 80 Station: -2 Presentation: Vertex Exam by:: Dr. Enid Derry  A&P: 21 y.o. G1P0000 [redacted]w[redacted]d here for SOL  AROM @2130  with moderate amount of clear fluid Continue current management Anticipate SVD  Michaela Illias Pantano, DO FM Resident PGY-1 02/03/2017 9:42 PM

## 2017-02-03 NOTE — MAU Note (Signed)
Thinks she is going in to labor.  Been having really bad contractions.  Started at 0900, getting stronger and closer. No bleeding or leaking. No recent exam.

## 2017-02-03 NOTE — Anesthesia Preprocedure Evaluation (Signed)
Anesthesia Evaluation  Patient identified by MRN, date of birth, ID band Patient awake    Reviewed: Allergy & Precautions, H&P , NPO status , Patient's Chart, lab work & pertinent test results  Airway Mallampati: I  TM Distance: >3 FB Neck ROM: full    Dental no notable dental hx. (+) Teeth Intact   Pulmonary former smoker,    Pulmonary exam normal breath sounds clear to auscultation       Cardiovascular negative cardio ROS Normal cardiovascular exam Rhythm:regular Rate:Normal     Neuro/Psych    GI/Hepatic negative GI ROS, Neg liver ROS,   Endo/Other  negative endocrine ROS  Renal/GU negative Renal ROS     Musculoskeletal negative musculoskeletal ROS (+)   Abdominal Normal abdominal exam  (+)   Peds  Hematology negative hematology ROS (+)   Anesthesia Other Findings   Reproductive/Obstetrics (+) Pregnancy                             Anesthesia Physical Anesthesia Plan  ASA: II  Anesthesia Plan: Epidural   Post-op Pain Management:    Induction:   PONV Risk Score and Plan:   Airway Management Planned:   Additional Equipment:   Intra-op Plan:   Post-operative Plan:   Informed Consent: I have reviewed the patients History and Physical, chart, labs and discussed the procedure including the risks, benefits and alternatives for the proposed anesthesia with the patient or authorized representative who has indicated his/her understanding and acceptance.     Plan Discussed with:   Anesthesia Plan Comments:         Anesthesia Quick Evaluation  

## 2017-02-03 NOTE — Progress Notes (Signed)
Patient ID: Michaela Williams, female   DOB: [birth year], 21 y.o.   MRN: [id number] MERCED Williams is a 21 y.o. G1P0000 at [redacted]w[redacted]d.  Subjective: Pain well-controlled w/ Fentanyl  Objective: BP 118/71   Pulse 90   Temp 98.3 F (36.8 C) (Oral)   Resp 20   Ht 5\' 1"  (1.549 m)   Wt 137 lb 4 oz (62.3 kg)   LMP 05/14/2016   SpO2 100%   BMI 25.93 kg/m    FHT:  FHR: 135 bpm, variability: mod,  accelerations:  15x15,  decelerations:  none UC:   Q 3-4 minutes, Moderate Dilation: 4.5 Effacement (%): 90 Station: -2 Presentation: Vertex Exam by:: Marlou Porch, CNM  Labs: Results for orders placed or performed during the hospital encounter of 02/03/17 (from the past 24 hour(s))  CBC     Status: Abnormal   Collection Time: 02/03/17  4:30 PM  Result Value Ref Range   WBC 15.6 (H) 4.0 - 10.5 K/uL   RBC 3.34 (L) 3.87 - 5.11 MIL/uL   Hemoglobin 9.9 (L) 12.0 - 15.0 g/dL   HCT 30.1 (L) 36.0 - 46.0 %   MCV 90.1 78.0 - 100.0 fL   MCH 29.6 26.0 - 34.0 pg   MCHC 32.9 30.0 - 36.0 g/dL   RDW 13.8 11.5 - 15.5 %   Platelets 209 150 - 400 K/uL  Type and screen [place]     Status: None   Collection Time: 02/03/17  4:30 PM  Result Value Ref Range   ABO/RH(D) A POS    Antibody Screen NEG    Sample Expiration 02/06/2017     Assessment / Plan: [redacted]w[redacted]d week IUP Labor: Early Fetal Wellbeing:  Category I Pain Control:  Fentanyl Anticipated MOD:  SVD  Manya Silvas, [place] 02/03/2017 5:54 PM

## 2017-02-03 NOTE — Anesthesia Pain Management Evaluation Note (Signed)
  CRNA Pain Management Visit Note  Patient: Michaela Williams, 21 y.o., female  "Hello I am a member of the anesthesia team at [facility]. We have an anesthesia team available at all times to provide care throughout the hospital, including epidural management and anesthesia for C-section. I don't know your plan for the delivery whether it a natural birth, water birth, IV sedation, nitrous supplementation, doula or epidural, but we want to meet your pain goals."   1.Was your pain managed to your expectations on prior hospitalizations?   No prior hospitalizations  2.What is your expectation for pain management during this hospitalization?     Epidural  3.How can we help you reach that goal?   Record the patient's initial score and the patient's pain goal.   Pain: 10  Pain Goal: 5 [facility] wants you to be able to say your pain was always managed very well.  Jabier Mutton 02/03/2017

## 2017-02-03 NOTE — Progress Notes (Signed)
Michaela Williams is a 21 y.o. G1P0000 at [redacted]w[redacted]d admitted for active labor  Subjective:  Patient's pain improving with epidural placement. Attending called in for decel event.    Objective: BP 110/74   Pulse 79   Temp 98.3 F (36.8 C) (Oral)   Resp 20   Ht 5\' 1"  (1.549 m)   Wt 62.3 kg (137 lb 4 oz)   LMP 05/14/2016   SpO2 100%   BMI 25.93 kg/m  No intake/output data recorded. No intake/output data recorded.  Patient Vitals for the past 24 hrs:  BP Pulse Resp SpO2  02/03/17 2031 115/67 88 - -  02/03/17 2001 110/74 79 20 -  02/03/17 1956 109/69 92 18 -  02/03/17 1946 104/64 68 18 -  02/03/17 1945 100/62 81 18 100 %  02/03/17 1944 93/60 96 18 -  02/03/17 1941 (!) 103/51 97 18 -  02/03/17 1939 119/88 74 18 99 %  02/03/17 1937 115/73 90 - 100 %  02/03/17 1933 (!) 113/98 84 18 100 %  02/03/17 1931 (!) 79/51 92 18 -  02/03/17 1926 111/68 96 18 -  02/03/17 1921 120/74 97 18 -  02/03/17 1920 - - - 100 %  02/03/17 1915 113/71 97 20 100 %           FHT:  FHR: 135 bpm, variability: moderate,  accelerations:  Abscent,  decelerations:  Present after hypotensive episode.  UC:   regular, every 4-6 minutes SVE:   Dilation: 5 Effacement (%): 80 Station: -2 Exam by:: Marlou Porch, CNM  Labs: Lab Results  Component Value Date   WBC 15.6 (H) 02/03/2017   HGB 9.9 (L) 02/03/2017   HCT 30.1 (L) 02/03/2017   MCV 90.1 02/03/2017   PLT 209 02/03/2017    Assessment / Plan: SOL   Baby had prolonged decel into 60s for about 6 minutes after epidural placement; likely secondary to hypotension. Mom and baby responded to fluid bolus and phenylephrine.   Labor: Progressing normally Fetal Wellbeing:  Category II - fetal heart rate reassuring  Pain Control:  Epidural I/D:  n/a Anticipated MOD:  NSVD  Davis Vannatter C Jacolby Risby 02/03/2017, 8:39 PM

## 2017-02-03 NOTE — Anesthesia Procedure Notes (Signed)
Epidural Patient location during procedure: OB Start time: 02/03/2017 6:54 PM End time: 02/03/2017 6:57 PM  Staffing Anesthesiologist: Lyn Hollingshead Performed: anesthesiologist   Preanesthetic Checklist Completed: patient identified, surgical consent, pre-op evaluation, timeout performed, IV checked, risks and benefits discussed and monitors and equipment checked  Epidural Patient position: sitting Prep: site prepped and draped and DuraPrep Patient monitoring: continuous pulse ox and blood pressure Approach: midline Location: L3-L4 Injection technique: LOR air  Needle:  Needle type: Tuohy  Needle gauge: 17 G Needle length: 9 cm and 9 Needle insertion depth: 6 cm Catheter type: closed end flexible Catheter size: 19 Gauge Catheter at skin depth: 11 cm Test dose: negative and Other  Assessment Sensory level: T9 Events: blood not aspirated, injection not painful, no injection resistance, negative IV test and no paresthesia  Additional Notes Reason for block:procedure for pain

## 2017-02-04 DIAGNOSIS — Z3493 Encounter for supervision of normal pregnancy, unspecified, third trimester: Secondary | ICD-10-CM | POA: Diagnosis not present

## 2017-02-04 DIAGNOSIS — Z3A38 38 weeks gestation of pregnancy: Secondary | ICD-10-CM | POA: Diagnosis not present

## 2017-02-04 LAB — RPR: RPR: NONREACTIVE

## 2017-02-04 MED ORDER — ONDANSETRON HCL 4 MG PO TABS: 4.0000 mg | ORAL_TABLET | ORAL | Status: DC | PRN

## 2017-02-04 MED ORDER — SENNOSIDES-DOCUSATE SODIUM 8.6-50 MG PO TABS
2.0000 | ORAL_TABLET | ORAL | Status: DC
  Administered 2017-02-05 – 2017-02-06 (×2): 2 via ORAL
  Filled 2017-02-04 (×2): qty 2

## 2017-02-04 MED ORDER — OXYCODONE-ACETAMINOPHEN 5-325 MG PO TABS
1.0000 | ORAL_TABLET | Freq: Four times a day (QID) | ORAL | Status: DC | PRN
  Administered 2017-02-04: 2 via ORAL
  Administered 2017-02-05: 1 via ORAL
  Filled 2017-02-04: qty 1
  Filled 2017-02-04: qty 2

## 2017-02-04 MED ORDER — PRENATAL MULTIVITAMIN CH
1.0000 | ORAL_TABLET | Freq: Every day | ORAL | Status: DC
  Administered 2017-02-04 – 2017-02-06 (×3): 1 via ORAL
  Filled 2017-02-04 (×3): qty 1

## 2017-02-04 MED ORDER — BENZOCAINE-MENTHOL 20-0.5 % EX AERO
1.0000 "application " | INHALATION_SPRAY | CUTANEOUS | Status: DC | PRN
  Administered 2017-02-04 – 2017-02-06 (×2): 1 via TOPICAL
  Filled 2017-02-04 (×2): qty 56

## 2017-02-04 MED ORDER — SIMETHICONE 80 MG PO CHEW: 80.0000 mg | CHEWABLE_TABLET | ORAL | Status: DC | PRN

## 2017-02-04 MED ORDER — ACETAMINOPHEN 325 MG PO TABS
650.0000 mg | ORAL_TABLET | ORAL | Status: DC | PRN
  Administered 2017-02-04 (×2): 650 mg via ORAL
  Filled 2017-02-04 (×3): qty 2

## 2017-02-04 MED ORDER — TETANUS-DIPHTH-ACELL PERTUSSIS 5-2.5-18.5 LF-MCG/0.5 IM SUSP: 0.5000 mL | Freq: Once | INTRAMUSCULAR | Status: DC

## 2017-02-04 MED ORDER — COCONUT OIL OIL: 1.0000 "application " | TOPICAL_OIL | Status: DC | PRN

## 2017-02-04 MED ORDER — WITCH HAZEL-GLYCERIN EX PADS: 1.0000 "application " | MEDICATED_PAD | CUTANEOUS | Status: DC | PRN

## 2017-02-04 MED ORDER — ZOLPIDEM TARTRATE 5 MG PO TABS
5.0000 mg | ORAL_TABLET | Freq: Every evening | ORAL | Status: DC | PRN
  Administered 2017-02-05: 5 mg via ORAL
  Filled 2017-02-04: qty 1

## 2017-02-04 MED ORDER — DIPHENHYDRAMINE HCL 25 MG PO CAPS: 25.0000 mg | ORAL_CAPSULE | Freq: Four times a day (QID) | ORAL | Status: DC | PRN

## 2017-02-04 MED ORDER — ONDANSETRON HCL 4 MG/2ML IJ SOLN: 4.0000 mg | INTRAMUSCULAR | Status: DC | PRN

## 2017-02-04 MED ORDER — DIBUCAINE 1 % RE OINT: 1.0000 "application " | TOPICAL_OINTMENT | RECTAL | Status: DC | PRN

## 2017-02-04 MED ORDER — IBUPROFEN 600 MG PO TABS
600.0000 mg | ORAL_TABLET | Freq: Four times a day (QID) | ORAL | Status: DC
  Administered 2017-02-04 – 2017-02-06 (×9): 600 mg via ORAL
  Filled 2017-02-04 (×8): qty 1

## 2017-02-04 NOTE — Anesthesia Postprocedure Evaluation (Signed)
Anesthesia Post Note  Patient: Michaela Williams  Procedure(s) Performed: * No procedures listed *     Patient location during evaluation: Mother Baby Anesthesia Type: Epidural Level of consciousness: awake and alert and oriented Pain management: pain level controlled Vital Signs Assessment: post-procedure vital signs reviewed and stable Respiratory status: spontaneous breathing and nonlabored ventilation Cardiovascular status: stable Postop Assessment: no headache, patient able to bend at knees, no backache, no signs of nausea or vomiting, epidural receding and adequate PO intake Anesthetic complications: no    Last Vitals:  Vitals:   02/04/17 0745 02/04/17 0810  BP: 122/74 114/76  Pulse: 97 90  Resp: 18 18  Temp:  37.6 C    Last Pain:  Vitals:   02/04/17 0810  TempSrc: Oral  PainSc: 4    Pain Goal: Patients Stated Pain Goal: 5 (02/03/17 1718)               Jabier Mutton

## 2017-02-04 NOTE — Progress Notes (Signed)
Patient ID: Michaela Williams, female   DOB: [birth year], 21 y.o.   MRN: [id number] Labor Progress Note  S: Patient seen & examined for progress of labor. Patient comfortable with epidural, sleeping. Mom of patient concerned with BP's being "low", reassured that her pressures are good for her. We woke her to ask if she was ok, and she said she feels fine and went back to sleep. Baby continues to look good on monitor.  O: BP 91/62   Pulse 87   Temp 97.9 F (36.6 C) (Axillary)   Resp 20   Ht 5\' 1"  (1.549 m)   Wt 62.3 kg (137 lb 4 oz)   LMP 05/14/2016   SpO2 100%   BMI 25.93 kg/m   FHT: 120bpm, mod var, +accels, no decels TOCO: q1-63min, patient looks comfortable during contractions  CVE: Dilation: 7.5 Effacement (%): 100 Station: 0, +1 Presentation: Vertex Exam by:: Mammie Russian, RN  A&P: 21 y.o. G1P0000 [redacted]w[redacted]d here for SOL  S/p AROM @2130  Continue expectant management Anticipate SVD  [place] Anton [place], DO FM Resident PGY-1 02/04/2017 1:24 AM

## 2017-02-04 NOTE — Progress Notes (Signed)
MOB was referred for history of depression/anxiety. * Referral screened out by Clinical Social Worker because none of the following criteria appear to apply: ~ History of anxiety/depression during this pregnancy, or of post-partum depression. ~ Diagnosis of anxiety and/or depression within last 3 years OR * MOB's symptoms currently being treated with medication and/or therapy. MOB has RX for Vistaril.  Please contact the Clinical Social Worker if needs arise, by [facility] request, or if MOB scores 9 or greater/yes to question 10 on Edinburgh Postpartum Depression Screen.  Laurey Arrow, MSW, LCSW Clinical Social Work [phone]

## 2017-02-05 MED ORDER — SERTRALINE HCL 25 MG PO TABS
25.0000 mg | ORAL_TABLET | Freq: Every day | ORAL | Status: DC
  Administered 2017-02-05: 25 mg via ORAL
  Filled 2017-02-05 (×2): qty 1

## 2017-02-05 NOTE — Progress Notes (Signed)
CSW met with MOB in room 107 for a consult for Edinburgh score of 17.  When CSW arrived, MOB was engaging in skin to skin with infant.  MOB appeared comfortable and was receptive to meeting with CSW. MOB gave CSW permission to meet with MOB while FOB was asleep on couch.  CSW explained CSW's role and inquired about MOB's thoughts and feelings.  MOB was teary eyed as she expressed her current feelings.  MOB acknowledged a hx of depression and reported being on medication in the past for depression (Lexapro). MOB communicated MOB discontinued medication after learning natural coping mechanism during CBT counseling.  MOB had insight and awareness about her MH.  CSW assessed for safety and MOB denied current SI and HI.  However, MOB reported a hx of HI. CSW reviewed natural coping interventions and provided education regarding Baby Blues vs PMADs and provided MOB with information about support groups held at [place] encouraged MOB to evaluate her mental health throughout the postpartum period with the use of the New Mom Checklist developed by Postpartum Progress and notify a medical professional if symptoms arise.  MOB declined resources for outpatient counseling but was receptive to initiating medication while inpatient. MOB was also receptive to being evaluated by OB in 2-3 weeks as oppose to 6 weeks. CSW thanked MOB for meeting with CSW and provided MOB with CSW's contact information.  CSW updated bedside nurse and requested medication management for MOB's depression.  There are no barriers to d/c.  Laurey Arrow, MSW, LCSW Clinical Social Work [phone]

## 2017-02-05 NOTE — Progress Notes (Signed)
POSTPARTUM PROGRESS NOTE  Post Partum Day 1 Subjective:  Michaela Williams is a 21 y.o. G1P0000 [redacted]w[redacted]d s/p svd.  No acute events overnight.  Pt denies problems with ambulating, voiding or po intake.  She denies nausea or vomiting.  Pain is poorly controlled this morning reports 8/10She has had flatus. She has not had bowel movement.  Lochia Moderate.   Objective: Blood pressure 101/62, pulse 67, temperature 97.8 F (36.6 C), resp. rate 18, height 5\' 1"  (1.549 m), weight 62.3 kg (137 lb 4 oz), last menstrual period 05/14/2016, SpO2 100 %.  Physical Exam:  General: alert, cooperative and no distress Lochia:normal flow Chest: CTAB Heart: RRR no m/r/g Abdomen: +BS, soft, nontender,  Uterine Fundus: firm DVT Evaluation: No calf swelling or tenderness Extremities: no edema Anxious affect   Recent Labs  02/03/17 1630  HGB 9.9*  HCT 30.1*    Assessment/Plan:  ASSESSMENT: Michaela Williams is a 21 y.o. G1P0000 [redacted]w[redacted]d s/p svd  Plan for discharge tomorrow, pt wants to make sure everything is ok with her and baby before leaving.    LOS: 2 days   Rodney Booze, Medical Student

## 2017-02-05 NOTE — Progress Notes (Signed)
Patient ID: Michaela Williams, female   DOB: [birth year], 21 y.o.   MRN: [id number] I have seen this patient and agree.

## 2017-02-05 NOTE — Lactation Note (Signed)
This note was copied from a baby's chart. Lactation Consultation Note  Patient Name: Michaela Williams Date: 02/05/2017 Reason for consult: Initial assessment  Baby 63 hours old. Mom reports that baby was only nursing 10 minutes at a time yesterday, but today the baby is nursing more often and for at least 15 minutes or longer. Mom has baby latched in laid back position, and baby nursing with lips flanged and rhythmical suckling with a few swallows noted. Enc mom to keep offering lots of STS and nursing with cues. Mom denies any nipple pain, only a slight bit of discomfort when first latching. Discussed reason for initial discomfort, infant behavior and supply and demand. Mom reports that she is able to hand express and colostrum is easily expressible bilaterally. Mom given [facility] brochure, aware of OP/BFSG and [place] phone line assistance after D/C.   Maternal Data Has patient been taught Hand Expression?: Yes Does the patient have breastfeeding experience prior to this delivery?: No  Feeding Feeding Type: Breast Fed Length of feed: 19 min  LATCH Score Latch: Grasps breast easily, tongue down, lips flanged, rhythmical sucking.  Audible Swallowing: A few with stimulation  Type of Nipple: Everted at rest and after stimulation  Comfort (Breast/Nipple): Soft / non-tender  Hold (Positioning): No assistance needed to correctly position infant at breast.  LATCH Score: 9  Interventions    Lactation Tools Discussed/Used     Consult Status Consult Status: Follow-up Date: 02/06/17 Follow-up type: In-patient    Andres Labrum 02/05/2017, 12:00 PM

## 2017-02-06 ENCOUNTER — Encounter (HOSPITAL_COMMUNITY): Payer: Self-pay | Admitting: *Deleted

## 2017-02-06 MED ORDER — SERTRALINE HCL 25 MG PO TABS: 25.0000 mg | ORAL_TABLET | Freq: Every day | ORAL | 3 refills | Status: DC

## 2017-02-06 MED ORDER — IBUPROFEN 600 MG PO TABS: 600.0000 mg | ORAL_TABLET | Freq: Four times a day (QID) | ORAL | 0 refills | Status: DC | PRN
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
## 2017-02-06 NOTE — Lactation Note (Signed)
This note was copied from a baby's chart. Lactation Consultation Note  Patient Name: Michaela Williams [id number] Date: 02/06/2017 Reason for consult: Follow-up assessment;1st time breastfeeding;Engorgement   OP appt reminder given to parents with instructions. OP appt scheduled for Monday 8/6 @ 3:30 pm.    Maternal Data Formula Feeding for Exclusion: No Has patient been taught Hand Expression?: Yes Does the patient have breastfeeding experience prior to this delivery?: No  Feeding Feeding Type: Breast Fed Length of feed: 15 min  LATCH Score Latch: Repeated attempts needed to sustain latch, nipple held in mouth throughout feeding, stimulation needed to elicit sucking reflex.  Audible Swallowing: Spontaneous and intermittent  Type of Nipple: Everted at rest and after stimulation  Comfort (Breast/Nipple): Engorged, cracked, bleeding, large blisters, severe discomfort  Hold (Positioning): Assistance needed to correctly position infant at breast and maintain latch.  LATCH Score: 6  Interventions Interventions: Breast feeding basics reviewed;Assisted with latch;Support pillows;Position options;Skin to skin;Expressed milk;Reverse pressure;Breast compression  Lactation Tools Discussed/Used Tools: Pump Breast pump type: Manual WIC Program: Yes Pump Review: Setup, frequency, and cleaning;Milk Storage Initiated by:: Reviewed Engorgement treatment   Consult Status Consult Status: Follow-up Date: 02/09/17 Follow-up type: Out-patient    [name] Merrill Deanda 02/06/2017, 11:12 AM

## 2017-02-06 NOTE — Lactation Note (Signed)
This note was copied from a baby's chart. Lactation Consultation Note  Patient Name: Michaela Williams [id number] Date: 02/06/2017 Reason for consult: Follow-up assessment   Follow up with mom of 34 hour old infant. Infant with 9 BF for 10-30 minutes, 1 attempt, 4 voids and 1 stool in 24 hours preceding this assessment. Infant weight 6 lb 10 oz with 7% weight loss since birth. LATCH score 9.   Mom on Zoloft for depression (L2-Probably compatible per [facility]erna Medications and Mothers Milk 2017). SW notes indicate mom has prescription for Visteral (L2- Limited data-Probably Compatible per [facility] Perna Medications and Mothers Milk 2017) MAR does not indicate mom is currently taking.   Infant asleep in bed with dad and mom asleep when [name] arrived, reiterated to parents that infant should not be sleeping in bed with parents when they are sleeping. Both parents aroused easily and mom appears very sleepy. Mom reports she feels BF is going ok. She reports infant last fed around 6:15 and that he is eating for longer periods of time. She reports some tenderness with initial latch that improves with feeding. Mom reports infant sleeps part of the time at the breast, enc mom to massage/compress breast with feeding and to stimulate infant to keep him awake with feedings. She reports she is feeling very full today and is noted to be engorged especially on the outer aspects of the breasts. Ice packs were placed to breasts in preparation of the next feeding. Engorgement Treatment sheet taken to room. [place] phone # left for mom to call when infant awakens to feed.   Report to Windy Carina, RN. LC to follow up with next feeding. Mom reports she has an electric breast pump at home, she is unsure of the brand. She has a manual pump at bedside.    Maternal Data Formula Feeding for Exclusion: No Has patient been taught Hand Expression?: Yes Does the patient have breastfeeding experience prior to this delivery?: No  Feeding     LATCH Score          Comfort (Breast/Nipple): Engorged, cracked, bleeding, large blisters, severe discomfort (ice packs applied)        Interventions Interventions: Ice  Lactation Tools Discussed/Used     Consult Status Consult Status: Follow-up Date: 02/06/17 Follow-up type: In-patient    Michaela Williams Michaela Williams 02/06/2017, 9:17 AM

## 2017-02-06 NOTE — Discharge Instructions (Signed)
Postpartum Depression and Baby Blues °The postpartum period begins right after the birth of a baby. During this time, there is often a great amount of joy and excitement. It is also a time of many changes in the life of the parents. Regardless of how many times a mother gives birth, each child brings new challenges and dynamics to the family. It is not unusual to have feelings of excitement along with confusing shifts in moods, emotions, and thoughts. All mothers are at risk of developing postpartum depression or the "baby blues." These mood changes can occur right after giving birth, or they may occur many months after giving birth. The baby blues or postpartum depression can be mild or severe. Additionally, postpartum depression can go away rather quickly, or it can be a long-term condition. °What are the causes? °Raised hormone levels and the rapid drop in those levels are thought to be a main cause of postpartum depression and the baby blues. A number of hormones change during and after pregnancy. Estrogen and progesterone usually decrease right after the delivery of your baby. The levels of thyroid hormone and various cortisol steroids also rapidly drop. Other factors that play a role in these mood changes include major life events and genetics. °What increases the risk? °If you have any of the following risks for the baby blues or postpartum depression, know what symptoms to watch out for during the postpartum period. Risk factors that may increase the likelihood of getting the baby blues or postpartum depression include: °· Having a personal or family history of depression. °· Having depression while being pregnant. °· Having premenstrual mood issues or mood issues related to oral contraceptives. °· Having a lot of life stress. °· Having marital conflict. °· Lacking a social support network. °· Having a baby with special needs. °· Having health problems, such as diabetes. ° °What are the signs or  symptoms? °Symptoms of baby blues include: °· Brief changes in mood, such as going from extreme happiness to sadness. °· Decreased concentration. °· Difficulty sleeping. °· Crying spells, tearfulness. °· Irritability. °· Anxiety. ° °Symptoms of postpartum depression typically begin within the first month after giving birth. These symptoms include: °· Difficulty sleeping or excessive sleepiness. °· Marked weight loss. °· Agitation. °· Feelings of worthlessness. °· Lack of interest in activity or food. ° °Postpartum psychosis is a very serious condition and can be dangerous. Fortunately, it is rare. Displaying any of the following symptoms is cause for immediate medical attention. Symptoms of postpartum psychosis include: °· Hallucinations and delusions. °· Bizarre or disorganized behavior. °· Confusion or disorientation. ° °How is this diagnosed? °A diagnosis is made by an evaluation of your symptoms. There are no medical or lab tests that lead to a diagnosis, but there are various questionnaires that a health care provider may use to identify those with the baby blues, postpartum depression, or psychosis. Often, a screening tool called the Edinburgh Postnatal Depression Scale is used to diagnose depression in the postpartum period. °How is this treated? °The baby blues usually goes away on its own in 1-2 weeks. Social support is often all that is needed. You will be encouraged to get adequate sleep and rest. Occasionally, you may be given medicines to help you sleep. °Postpartum depression requires treatment because it can last several months or longer if it is not treated. Treatment may include individual or group therapy, medicine, or both to address any social, physiological, and psychological factors that may play a role in the   depression. Regular exercise, a healthy diet, rest, and social support may also be strongly recommended. °Postpartum psychosis is more serious and needs treatment right away.  Hospitalization is often needed. °Follow these instructions at home: °· Get as much rest as you can. Nap when the baby sleeps. °· Exercise regularly. Some women find yoga and walking to be beneficial. °· Eat a balanced and nourishing diet. °· Do little things that you enjoy. Have a cup of tea, take a bubble bath, read your favorite magazine, or listen to your favorite music. °· Avoid alcohol. °· Ask for help with household chores, cooking, grocery shopping, or running errands as needed. Do not try to do everything. °· Talk to people close to you about how you are feeling. Get support from your partner, family members, friends, or other new moms. °· Try to stay positive in how you think. Think about the things you are grateful for. °· Do not spend a lot of time alone. °· Only take over-the-counter or prescription medicine as directed by your health care provider. °· Keep all your postpartum appointments. °· Let your health care provider know if you have any concerns. °Contact a health care provider if: °You are having a reaction to or problems with your medicine. °Get help right away if: °· You have suicidal feelings. °· You think you may harm the baby or someone else. °This information is not intended to replace advice given to you by your health care provider. Make sure you discuss any questions you have with your health care provider. °Document Released: 03/27/2004 Document Revised: 11/29/2015 Document Reviewed: 04/04/2013 °Elsevier Interactive Patient Education © 2017 [employer] °Home Care Instructions for Mom °ACTIVITY °· Gradually return to your regular activities. °· Let yourself rest. Nap while your baby sleeps. °· Avoid lifting anything that is heavier than 10 lb (4.5 kg) until your health care provider says it is okay. °· Avoid activities that take a lot of effort and energy (are strenuous) until approved by your health care provider. Walking at a slow-to-moderate pace is usually safe. °· If you had a  cesarean delivery: °? Do not vacuum, climb stairs, or drive a car for 4-6 weeks. °? Have someone help you at home until you feel like you can do your usual activities yourself. °? Do exercises as told by your health care provider, if this applies. ° °VAGINAL BLEEDING °You may continue to bleed for 4-6 weeks after delivery. Over time, the amount of blood usually decreases and the color of the blood usually gets lighter. However, the flow of bright red blood may increase if you have been too active. If you need to use more than one pad in an hour because your pad gets soaked, or if you pass a large clot: °· Lie down. °· Raise your feet. °· Place a cold compress on your lower abdomen. °· Rest. °· Call your health care provider. ° °If you are breastfeeding, your period should return anytime between 8 weeks after delivery and the time that you stop breastfeeding. If you are not breastfeeding, your period should return 6-8 weeks after delivery. °PERINEAL CARE °The perineal area, or perineum, is the part of your body between your thighs. After delivery, this area needs special care. Follow these instructions as told by your health care provider. °· Take warm tub baths for 15-20 minutes. °· Use medicated pads and pain-relieving sprays and creams as told. °· Do not use tampons or douches until vaginal bleeding has stopped. °· Each   time you go to the bathroom: °? Use a peri bottle. °? Change your pad. °? Use towelettes in place of toilet paper until your stitches have healed. °· Do Kegel exercises every day. Kegel exercises help to maintain the muscles that support the vagina, bladder, and bowels. You can do these exercises while you are standing, sitting, or lying down. To do Kegel exercises: °? Tighten the muscles of your abdomen and the muscles that surround your birth canal. °? Hold for a few seconds. °? Relax. °? Repeat until you have done this 5 times in a row. °· To prevent hemorrhoids from developing or getting  worse: °? Drink enough fluid to keep your urine clear or pale yellow. °? Avoid straining when having a bowel movement. °? Take over-the-counter medicines and stool softeners as told by your health care provider. ° °BREAST CARE °· Wear a tight-fitting bra. °· Avoid taking over-the-counter pain medicine for breast discomfort. °· Apply ice to the breasts to help with discomfort as needed: °? Put ice in a plastic bag. °? Place a towel between your skin and the bag. °? Leave the ice on for 20 minutes or as told by your health care provider. ° °NUTRITION °· Eat a well-balanced diet. °· Do not try to lose weight quickly by cutting back on calories. °· Take your prenatal vitamins until your postpartum checkup or until your health care provider tells you to stop. ° °POSTPARTUM DEPRESSION °You may find yourself crying for no apparent reason and unable to cope with all of the changes that come with having a newborn. This mood is called postpartum depression. Postpartum depression happens because your hormone levels change after delivery. If you have postpartum depression, get support from your partner, friends, and family. If the depression does not go away on its own after several weeks, contact your health care provider. °BREAST SELF-EXAM °Do a breast self-exam each month, at the same time of the month. If you are breastfeeding, check your breasts just after a feeding, when your breasts are less full. If you are breastfeeding and your period has started, check your breasts on day 5, 6, or 7 of your period. °Report any lumps, bumps, or discharge to your health care provider. Know that breasts are normally lumpy if you are breastfeeding. This is temporary, and it is not a health risk. °INTIMACY AND SEXUALITY °Avoid sexual activity for at least 3-4 weeks after delivery or until the brownish-red vaginal flow is completely gone. If you want to avoid pregnancy, use some form of birth control. You can get pregnant after delivery,  even if you have not had your period. °SEEK MEDICAL CARE IF: °· You feel unable to cope with the changes that a child brings to your life, and these feelings do not go away after several weeks. °· You notice a lump, a bump, or discharge on your breast. ° °SEEK IMMEDIATE MEDICAL CARE IF: °· Blood soaks your pad in 1 hour or less. °· You have: °? Severe pain or cramping in your lower abdomen. °? A bad-smelling vaginal discharge. °? A fever that is not controlled by medicine. °? A fever, and an area of your breast is red and sore. °? Pain or redness in your calf. °? Sudden, severe chest pain. °? Shortness of breath. °? Painful or bloody urination. °? Problems with your vision. °· You vomit for 12 hours or longer. °· You develop a severe headache. °· You have serious thoughts about hurting yourself, your child, or   anyone else. ° °This information is not intended to replace advice given to you by your health care provider. Make sure you discuss any questions you have with your health care provider. °Document Released: 06/20/2000 Document Revised: 11/29/2015 Document Reviewed: 12/25/2014 °Elsevier Interactive Patient Education © 2017 [employer] ° °

## 2017-02-06 NOTE — Lactation Note (Addendum)
This note was copied from a baby's chart. Lactation Consultation Note  Patient Name: Michaela Williams [id number] Date: 02/06/2017 Reason for consult: Follow-up assessment;1st time breastfeeding;Engorgement   Follow up with mom of 36 hour old infant. Infant awake and ready to feed. Mom crying and concerned she cannot feed her infant. Latched infant to right breast and infant fed off and on. Minimal swallows without breast compression/massage. Breast is lumpy and softened some with feeding. Infant then latched to left breast and multiple swallows noted. Infant noted to have cheek dimpling through entire feeding. mom had dimples as does infant. Showed parents how to flange lips. He forms a good seal on gloved finger and extends tongue well on gloved finger.  Breast softened some with feeding. Areolas are puffy today, showed mom reverse pressure and enc her to do before feeding. Infant calm and quiet after feeding for about 15 minutes and then woke up to feed again.   Mom was very tearful this morning and concerned she cannot latch infant. She did very well latching infant. Praised mom and dad for their efforts.   Mom pumped with manual pump after and c/o tenderness. She pumped off 3 cc that FOB fed to infant in a syringe, infant tolerated it well. He then relatched to left breast and was feeding when Cecilia left room. Reviewed steps of Engorgement treatment on green sheet and enc mom to ice before feeding/pumpnig and to pump with her DEBP at home post BF to soften breasts. Advised parents to offer infant all EBM via syringe after BF and if having difficulty getting infant to take with syringe to then use a bottle. Reviewed breast milk handling and storage.   Mom agreeable to OP appt. OP Clinic to call mom with OP appt time. Mom is wanting to come in on Monday or Tuesday. Parents without further questions/concerns at this time.    Maternal Data Formula Feeding for Exclusion: No Has patient been taught Hand  Expression?: Yes Does the patient have breastfeeding experience prior to this delivery?: No  Feeding Feeding Type: Breast Fed Length of feed: 15 min  LATCH Score Latch: Repeated attempts needed to sustain latch, nipple held in mouth throughout feeding, stimulation needed to elicit sucking reflex.  Audible Swallowing: Spontaneous and intermittent  Type of Nipple: Everted at rest and after stimulation  Comfort (Breast/Nipple): Engorged, cracked, bleeding, large blisters, severe discomfort  Hold (Positioning): Assistance needed to correctly position infant at breast and maintain latch.  LATCH Score: 6  Interventions Interventions: Breast feeding basics reviewed;Assisted with latch;Support pillows;Position options;Skin to skin;Expressed milk;Reverse pressure;Breast compression  Lactation Tools Discussed/Used Tools: Pump Breast pump type: Manual WIC Program: Yes Pump Review: Setup, frequency, and cleaning;Milk Storage Initiated by:: Reviewed Engorgement treatment   Consult Status Consult Status: Follow-up Date: 02/09/17 Follow-up type: [place] 02/06/2017, 10:46 AM

## 2017-02-09 ENCOUNTER — Encounter: Payer: Managed Care, Other (non HMO) | Admitting: Certified Nurse Midwife

## 2017-02-09 ENCOUNTER — Ambulatory Visit: Payer: Managed Care, Other (non HMO)

## 2017-02-16 ENCOUNTER — Encounter: Payer: Managed Care, Other (non HMO) | Admitting: Certified Nurse Midwife

## 2017-02-20 ENCOUNTER — Ambulatory Visit (INDEPENDENT_AMBULATORY_CARE_PROVIDER_SITE_OTHER): Payer: Medicaid Other | Admitting: Certified Nurse Midwife

## 2017-02-20 ENCOUNTER — Encounter: Payer: Self-pay | Admitting: Obstetrics

## 2017-02-20 VITALS — BP 129/81 | HR 85 | Ht 61.0 in | Wt 122.0 lb

## 2017-02-20 DIAGNOSIS — F331 Major depressive disorder, recurrent, moderate: Secondary | ICD-10-CM

## 2017-02-20 DIAGNOSIS — Z1389 Encounter for screening for other disorder: Secondary | ICD-10-CM | POA: Diagnosis not present

## 2017-02-20 MED ORDER — FLUOXETINE HCL 40 MG PO CAPS: 40.0000 mg | ORAL_CAPSULE | Freq: Every day | ORAL | 5 refills | Status: DC

## 2017-02-20 NOTE — Progress Notes (Signed)
.  Post Partum Exam  Michaela Williams is a 21 y.o. G64P0000 female who presents for a postpartum visit. She is 2 weeks postpartum following a spontaneous vaginal delivery. I have fully reviewed the prenatal and intrapartum course. The delivery was at 38.0 gestational weeks.  Anesthesia: epidural. Postpartum course has been good Baby's course has been good. Baby is feeding by breast. Bleeding thick, heavy lochia. Bowel function is normal. Bladder function is normal. Patient is not sexually active. Contraception method is none. Postpartum depression screening:positive: 20; denies homicidal/suicidal ideations.  Does have a plan/thoughts to cut herself.  Discussed with behavioral health.    The following portions of the patient's history were reviewed and updated as appropriate: allergies, current medications, past family history, past medical history, past social history, past surgical history and problem list.  Review of Systems Pertinent items noted in HPI and remainder of comprehensive ROS otherwise negative.    Objective:  unknown if currently breastfeeding.  General:  alert, cooperative and no distress   Breasts:  inspection negative, no nipple discharge or bleeding, no masses or nodularity palpable  Lungs: clear to auscultation bilaterally  Heart:  regular rate and rhythm, S1, S2 normal, no murmur, click, rub or gallop  Abdomen: soft, non-tender; bowel sounds normal; no masses,  no organomegaly  Pelvic Exam: Not performed.        Assessment:    Abnormal 2 week postpartum exam d/t depression.  Pap smear not done at today's visit.   breast feeding status  Plan:   1. Contraception: Nexplanon and insertion in  2 weeks 2. Started on antidepressants, counseling/psychiatry ordered.  Depression warning signs reviewed.  SW met with patient in office.  FOB present for exam.  Has POC for at home.   3. Follow up in: 2 weeks or as needed.

## 2017-02-23 ENCOUNTER — Encounter: Payer: Self-pay | Admitting: Certified Nurse Midwife

## 2017-03-04 ENCOUNTER — Ambulatory Visit: Payer: Managed Care, Other (non HMO) | Admitting: Obstetrics

## 2017-03-17 ENCOUNTER — Encounter: Payer: Self-pay | Admitting: Certified Nurse Midwife

## 2017-03-17 ENCOUNTER — Ambulatory Visit (INDEPENDENT_AMBULATORY_CARE_PROVIDER_SITE_OTHER): Payer: 59 | Admitting: Certified Nurse Midwife

## 2017-03-17 VITALS — BP 115/78 | HR 82 | Ht 61.0 in | Wt 117.3 lb

## 2017-03-17 DIAGNOSIS — Z3049 Encounter for surveillance of other contraceptives: Secondary | ICD-10-CM | POA: Diagnosis not present

## 2017-03-17 DIAGNOSIS — F3113 Bipolar disorder, current episode manic without psychotic features, severe: Secondary | ICD-10-CM

## 2017-03-17 DIAGNOSIS — F331 Major depressive disorder, recurrent, moderate: Secondary | ICD-10-CM

## 2017-03-17 DIAGNOSIS — Z3202 Encounter for pregnancy test, result negative: Secondary | ICD-10-CM

## 2017-03-17 DIAGNOSIS — Z30017 Encounter for initial prescription of implantable subdermal contraceptive: Secondary | ICD-10-CM

## 2017-03-17 LAB — POCT URINE PREGNANCY: Preg Test, Ur: NEGATIVE

## 2017-03-17 MED ORDER — ETONOGESTREL 68 MG ~~LOC~~ IMPL
68.0000 mg | DRUG_IMPLANT | Freq: Once | SUBCUTANEOUS | Status: AC
  Administered 2017-03-17: 68 mg via SUBCUTANEOUS

## 2017-03-17 MED ORDER — LURASIDONE HCL 40 MG PO TABS: 40.0000 mg | ORAL_TABLET | Freq: Every day | ORAL | 5 refills | Status: DC

## 2017-03-17 MED ORDER — FLUOXETINE HCL 40 MG PO CAPS: 40.0000 mg | ORAL_CAPSULE | Freq: Two times a day (BID) | ORAL | 5 refills | Status: DC

## 2017-03-17 NOTE — Progress Notes (Signed)
Post Partum Exam  Michaela Williams is a 21 y.o. G27P0000 female who presents for a postpartum visit. She is 5 weeks postpartum following a spontaneous vaginal delivery. I have fully reviewed the prenatal and intrapartum course. The delivery was at 62 gestational weeks.  Anesthesia: epidural. Postpartum course has been up and down. Baby's course has been good. Baby is feeding by breast. Bleeding no bleeding. Bowel function is constipation. Bladder function is normal. Patient is not sexually active. Contraception method is none. Postpartum depression screening:neg.  Has appointment for [facility] on 03/18/17.    The following portions of the patient's history were reviewed and updated as appropriate: allergies, current medications, past family history, past medical history, past social history, past surgical history and problem list.  Review of Systems Pertinent items noted in HPI and remainder of comprehensive ROS otherwise negative.    Objective:  currently breastfeeding.  General:  alert, cooperative and no distress   Breasts:  inspection negative, no nipple discharge or bleeding, no masses or nodularity palpable  Lungs: clear to auscultation bilaterally  Heart:  regular rate and rhythm, S1, S2 normal, no murmur, click, rub or gallop  Abdomen: soft, non-tender; bowel sounds normal; no masses,  no organomegaly  Pelvic Exam: Not performed.       Pap smear: 07/16/16: negative  Assessment:    Normal 5 week postpartum exam. Pap smear not done at today's visit.   Bi-polar like disorder  Plan:   1. Contraception: abstinence, Nexplanon and placed today 2. [facility] referral done, Prozac dose increased to twice daily. Latuda added for mania episode and insomnia.  3. Follow up in: 5 months or as needed.

## 2017-03-17 NOTE — Progress Notes (Signed)
Nexplanon Procedure Note   PRE-OP DIAGNOSIS: desired long-term, reversible contraception  POST-OP DIAGNOSIS: Same  PROCEDURE: Nexplanon  placement Performing Provider: Kandis Cocking CNM   Patient education prior to procedure, explained risk, benefits of Nexplanon, reviewed alternative options. Patient reported understanding. Gave consent to continue with procedure.   PROCEDURE:  Pregnancy Text :  Negative Site (check):      left arm         Sterile Preparation:   [id number] Lot # [id number] Expiration Date 08/2019  Insertion site was selected 8 - 10 cm from medial epicondyle and marked along with guiding site using sterile marker. Procedure area was prepped and draped in a sterile fashion. 1% Lidocaine 1.5 ml given prior to procedure. Nexplanon  was inserted subcutaneously.Needle was removed from the insertion site. Nexplanon capsule was palpated by provider and patient to assure satisfactory placement. And a bandage applied and the arm was wrapped with gauze bandage.     Followup: The patient tolerated the procedure well without complications.  Instructions:  The patient was instructed to remove the dressing in 24 hours and that some bruising is to be expected.  She was advised to use over the counter analgesics as needed for any pain at the site.  She is to keep the area dry for 24 hours and to call if her hand or arm becomes cold, numb, or blue.   Kandis Cocking CNM

## 2017-04-08 ENCOUNTER — Encounter: Payer: Self-pay | Admitting: *Deleted

## 2017-05-07 DIAGNOSIS — C50919 Malignant neoplasm of unspecified site of unspecified female breast: Secondary | ICD-10-CM

## 2017-05-07 HISTORY — DX: Malignant neoplasm of unspecified site of unspecified female breast: C50.919

## 2017-05-08 ENCOUNTER — Ambulatory Visit: Payer: Managed Care, Other (non HMO) | Admitting: Physician Assistant

## 2017-05-13 ENCOUNTER — Ambulatory Visit: Payer: Medicaid Other | Admitting: Physician Assistant

## 2017-05-14 ENCOUNTER — Other Ambulatory Visit: Payer: Self-pay

## 2017-05-14 ENCOUNTER — Encounter: Payer: Self-pay | Admitting: Family Medicine

## 2017-05-14 ENCOUNTER — Ambulatory Visit (INDEPENDENT_AMBULATORY_CARE_PROVIDER_SITE_OTHER): Payer: 59 | Admitting: Family Medicine

## 2017-05-14 VITALS — BP 110/69 | HR 87 | Temp 99.0°F | Resp 18 | Ht 63.62 in | Wt 117.6 lb

## 2017-05-14 DIAGNOSIS — N61 Mastitis without abscess: Secondary | ICD-10-CM | POA: Diagnosis not present

## 2017-05-14 MED ORDER — CEPHALEXIN 500 MG PO CAPS: 500.0000 mg | ORAL_CAPSULE | Freq: Four times a day (QID) | ORAL | 0 refills | Status: AC

## 2017-05-14 NOTE — Patient Instructions (Addendum)
IF you received an x-ray today, you will receive an invoice from [facility] Radiology. Please contact [facility] Radiology at [phone] with questions or concerns regarding your invoice.   IF you received labwork today, you will receive an invoice from [place]. Please contact [facility] at [phone] with questions or concerns regarding your invoice.   Our billing staff will not be able to assist you with questions regarding bills from these companies.  You will be contacted with the lab results as soon as they are available. The fastest way to get your results is to activate your My Chart account. Instructions are located on the last page of this paperwork. If you have not heard from [place] regarding the results in 2 weeks, please contact this office.     Breastfeeding and Mastitis Mastitis is inflammation of the breast tissue. It can occur in women who are breastfeeding. This can make breastfeeding painful. Mastitis will sometimes go away on its own, especially if it is not caused by an infection (non-infectious mastitis). Your health care provider will help determine if medical treatment is needed. Treatment may be needed if the condition is caused by a bacterial infection (infectious mastitis). What are the causes? This condition is often associated with a blocked milkduct, which can happen when too much milk builds up in the breast. Causes of excess milk in the breast can include:  Poor latch-on. If your baby is not latched onto the breast properly, he or she may not empty your breast completely while breastfeeding.  Allowing too much time to pass between feedings.  Wearing a bra or other clothing that is too tight. This puts extra pressure on the milk ducts so milk does not flow through them as it should.  Milk remaining in the breast because it is overfilled (engorged).  Stress and fatigue.  Mastitis can also be caused by a bacterial infection. Bacteria may enter the breast  tissue through cuts, cracks, or openings in the skin near the nipple area. Cracks in the skin are often caused when your baby does not latch on properly to the breast. What are the signs or symptoms? Symptoms of this condition include:  Swelling, redness, tenderness, and pain in an area of the breast. This usually affects the upper part of the breast, toward the armpit region. In most cases, it affects only one breast. In some cases, it may occur on both breasts at the same time and affect a larger portion of breast tissue.  Swelling of the glands under the arm on the same side.  Fatigue, headache, and flu-like muscle aches.  Fever.  Rapid pulse.  Symptoms usually last 2 to 5 days. Breast pain and redness are at their worst on day 2 and day 3, and they usually go away by day 5. If an infection is left to progress, a collection of pus (abscess) may develop. How is this diagnosed? This condition can be diagnosed based on your symptoms and a physical exam. You may also have tests, such as:  Blood tests to determine if your body is fighting a bacterial infection.  Mammogram or ultrasound tests to rule out other problems or diseases.  Fluid tests. If an abscess has developed, the fluid in the abscess may be removed with a needle. The fluid may be analyzed to determine if bacteria are present.  Breast milk may be cultured and tested for bacteria.  How is this treated? This condition will sometimes go away on its own. Your health  care provider may choose to wait 24 hours after first seeing you to decide whether treatment is needed. If treatment is needed, it may include:  Strategies to manage breastfeeding. This includes continuing to breastfeed or pump in order to allow adequate milk flow, using breast massage, and applying heat or cold to the affected area.  Self-care such as rest and increased fluid intake.  Medicine for pain.  Antibiotic medicine to treat a bacterial infection. This  is usually taken by mouth.  If an abscess has developed, it may be treated by removing fluid with a needle.  Follow these instructions at home: Medicines  Take over-the-counter and prescription medicines only as told by your health care provider.  If you were prescribed an antibiotic medicine, take it as told by your health care provider. Do not stop taking the antibiotic even if you start to feel better. General instructions  Do not wear a tight or underwire bra. Wear a soft, supportive bra.  Increase your fluid intake, especially if you have a fever.  Get plenty of rest. For breastfeeding:  Continue to empty your breasts as often as possible, either by breastfeeding or using an electric breast pump. This will lower the pressure and the pain that comes with it. Ask your health care provider if changes need to be made to your breastfeeding or pumping routine.  Keep your nipples clean and dry.  During breastfeeding, empty the first breast completely before going to the other breast. If your baby is not emptying your breasts completely, use a breast pump to empty your breasts.  Use breast massage during feeding or pumping sessions.  If directed, apply moist heat to the affected area of your breast right before breastfeeding or pumping. Use the heat source that your health care provider recommends.  If directed, put ice on the affected area of your breast right after breastfeeding or pumping: ? Put ice in a plastic bag. ? Place a towel between your skin and the bag. ? Leave the ice on for 20 minutes.  If you go back to work, pump your breasts while at work to stay in time with your nursing schedule.  Do not allow your breasts to become engorged. Contact a health care provider if:  You have pus-like discharge from the breast.  You have a fever.  Your symptoms do not improve within 2 days of starting treatment.  Your symptoms return after you have recovered from a breast  infection. Get help right away if:  Your pain and swelling are getting worse.  You have pain that is not controlled with medicine.  You have a red line extending from the breast toward your armpit. Summary  Mastitis is inflammation of the breast tissue. It is often caused by a blocked milk duct or bacteria.  This condition may be treated with hot and cold compresses, medicines, self-care, and certain breastfeeding strategies.  If you were prescribed an antibiotic medicine, take it as told by your health care provider. Do not stop taking the antibiotic even if you start to feel better.  Continue to empty your breasts as often as possible either by breastfeeding or using an electric breast pump. This information is not intended to replace advice given to you by your health care provider. Make sure you discuss any questions you have with your health care provider. Document Released: 10/18/2004 Document Revised: 06/24/2016 Document Reviewed: 06/24/2016 Elsevier Interactive Patient Education  2017 [employer].

## 2017-05-14 NOTE — Progress Notes (Signed)
11/8/20183:27 PM  Michaela Williams 20-May-1996, 21 y.o. female [id number]  Chief Complaint  Patient presents with  . Breast Mass    X 2-3 weeks- left side  . Depression    screening    HPI:   Patient is a 21 y.o. female who presents today for 2-3 weeks of painful tender breast mass, left side. Patient is s/p SVD 02/04/17. She is breast feeding and pumping at work. Feels nursing is going well. Right breast has dried up. She denies fever or chills. Reports having felt a lump in her left breast early pregnancy, has mammogram that was abnormal, biopsy not done due to being pregnant. However she states that the lump went away until 2-3 weeks ago.  She is seeing mental health.  Lives with boyfriend, feels safe at home.  Depression screen [facility] 2/9 05/14/2017 06/12/2016  Decreased Interest 3 0  Down, Depressed, Hopeless 2 0  PHQ - 2 Score 5 0  Altered sleeping 3 -  Tired, decreased energy 3 -  Change in appetite 3 -  Feeling bad or failure about yourself  2 -  Trouble concentrating 0 -  Moving slowly or fidgety/restless 3 -  Suicidal thoughts 0 -  PHQ-9 Score 19 -  Difficult doing work/chores Somewhat difficult -    Allergies  Allergen Reactions  . Abilify [Aripiprazole] Other (See Comments)    Did not like how it made her feel  . Lexapro [Escitalopram] Other (See Comments)    Didn't like how it made her feel  . Lithium     Childhood reaction    Prior to Admission medications   Medication Sig Start Date End Date Taking? Authorizing Provider  FLUoxetine (PROZAC) 40 MG capsule Take 1 capsule (40 mg total) by mouth 2 (two) times daily. 03/17/17  Yes Denney, Rachelle A, CNM  ibuprofen (ADVIL,MOTRIN) 600 MG tablet Take 1 tablet (600 mg total) by mouth every 6 (six) hours as needed. 02/06/17  Yes Myrtis Ser, CNM  lurasidone (LATUDA) 40 MG TABS tablet Take 1 tablet (40 mg total) by mouth daily with breakfast. 03/17/17  Yes Denney, Rachelle A, CNM  Prenat-FeCbn-FeAspGl-FA-Omega (OB  COMPLETE PETITE) 35-5-1-200 MG CAPS Take 1 tablet by mouth daily. 08/20/16  Yes Denney, Rachelle A, CNM  sertraline (ZOLOFT) 25 MG tablet Take 1 tablet (25 mg total) by mouth daily. 02/06/17  Yes Myrtis Ser, CNM    Past Medical History:  Diagnosis Date  . Anemia   . Asthma   . Depression    no meds in 3 yrs  . Headache   . Infection    UTI  . Seizures ([place]) 2014   "seizure like activity", reaction to medicaiton, none since    Past Surgical History:  Procedure Laterality Date  . NO PAST SURGERIES      Social History   Tobacco Use  . Smoking status: Former Smoker    Types: Cigarettes    Last attempt to quit: 12/19/2014    Years since quitting: 2.4  . Smokeless tobacco: Never Used  . Tobacco comment: age 67  Substance Use Topics  . Alcohol use: Yes    Comment: none since    Family History  Adopted: Yes  Problem Relation Age of Onset  . Mental illness Mother   . Cancer Mother   . Cancer Maternal Grandmother   . Hypertension Maternal Grandmother   . Diabetes Maternal Grandmother   . Cancer Maternal Aunt     ROS Per hpi  OBJECTIVE:  Blood pressure 110/69, pulse 87, temperature 99 F (37.2 C), temperature source Oral, resp. rate 18, height 5' 3.62" (1.616 m), weight 117 lb 9.6 oz (53.3 kg), SpO2 99 %, currently breastfeeding.  Physical Exam  Constitutional: She is well-developed, well-nourished, and in no distress.  Pulmonary/Chest: Right breast exhibits no inverted nipple, no mass, no nipple discharge, no skin change and no tenderness. Breasts are asymmetrical.    Lymphadenopathy:       Right cervical: No superficial cervical adenopathy present.      Left cervical: No superficial cervical adenopathy present.    She has no axillary adenopathy.     ASSESSMENT and PLAN 1. Mastitis, left, acute Discussed diagnosis, supportive measures, ok to continue to breastfeed. Patient educational handout given.  Meds ordered this encounter  Medications  .  cephALEXin (KEFLEX) 500 MG capsule    Sig: Take 1 capsule (500 mg total) 4 (four) times daily for 10 days by mouth.    Dispense:  40 capsule    Refill:  0    Return in about 1 week (around 05/21/2017).    Rutherford Guys, MD Primary Care at [place], [place] [address] Ph.  [phone] Fax [phone]

## 2017-05-21 ENCOUNTER — Other Ambulatory Visit: Payer: Self-pay

## 2017-05-21 ENCOUNTER — Ambulatory Visit (INDEPENDENT_AMBULATORY_CARE_PROVIDER_SITE_OTHER): Payer: 59 | Admitting: Family Medicine

## 2017-05-21 ENCOUNTER — Encounter: Payer: Self-pay | Admitting: Family Medicine

## 2017-05-21 VITALS — BP 110/78 | HR 117 | Temp 98.7°F | Resp 18 | Ht 63.62 in | Wt 117.4 lb

## 2017-05-21 DIAGNOSIS — N61 Mastitis without abscess: Secondary | ICD-10-CM | POA: Diagnosis not present

## 2017-05-21 MED ORDER — CLINDAMYCIN HCL 300 MG PO CAPS: 300.0000 mg | ORAL_CAPSULE | Freq: Three times a day (TID) | ORAL | 0 refills | Status: DC

## 2017-05-21 MED ORDER — SACCHAROMYCES BOULARDII 250 MG PO CAPS: 250.0000 mg | ORAL_CAPSULE | Freq: Two times a day (BID) | ORAL | 0 refills | Status: DC

## 2017-05-21 MED ORDER — IBUPROFEN 600 MG PO TABS: 600.0000 mg | ORAL_TABLET | Freq: Four times a day (QID) | ORAL | 0 refills | Status: DC | PRN

## 2017-05-21 NOTE — Patient Instructions (Addendum)
IF you received an x-ray today, you will receive an invoice from [facility] Radiology. Please contact [facility] Radiology at [phone] with questions or concerns regarding your invoice.   IF you received labwork today, you will receive an invoice from [place]. Please contact [facility] at [phone] with questions or concerns regarding your invoice.   Our billing staff will not be able to assist you with questions regarding bills from these companies.  You will be contacted with the lab results as soon as they are available. The fastest way to get your results is to activate your My Chart account. Instructions are located on the last page of this paperwork. If you have not heard from [place] regarding the results in 2 weeks, please contact this office.     Breastfeeding and Mastitis Mastitis is inflammation of the breast tissue. It can occur in women who are breastfeeding. This can make breastfeeding painful. Mastitis will sometimes go away on its own, especially if it is not caused by an infection (non-infectious mastitis). Your health care provider will help determine if medical treatment is needed. Treatment may be needed if the condition is caused by a bacterial infection (infectious mastitis). What are the causes? This condition is often associated with a blocked milkduct, which can happen when too much milk builds up in the breast. Causes of excess milk in the breast can include:  Poor latch-on. If your baby is not latched onto the breast properly, he or she may not empty your breast completely while breastfeeding.  Allowing too much time to pass between feedings.  Wearing a bra or other clothing that is too tight. This puts extra pressure on the milk ducts so milk does not flow through them as it should.  Milk remaining in the breast because it is overfilled (engorged).  Stress and fatigue.  Mastitis can also be caused by a bacterial infection. Bacteria may enter the breast  tissue through cuts, cracks, or openings in the skin near the nipple area. Cracks in the skin are often caused when your baby does not latch on properly to the breast. What are the signs or symptoms? Symptoms of this condition include:  Swelling, redness, tenderness, and pain in an area of the breast. This usually affects the upper part of the breast, toward the armpit region. In most cases, it affects only one breast. In some cases, it may occur on both breasts at the same time and affect a larger portion of breast tissue.  Swelling of the glands under the arm on the same side.  Fatigue, headache, and flu-like muscle aches.  Fever.  Rapid pulse.  Symptoms usually last 2 to 5 days. Breast pain and redness are at their worst on day 2 and day 3, and they usually go away by day 5. If an infection is left to progress, a collection of pus (abscess) may develop. How is this diagnosed? This condition can be diagnosed based on your symptoms and a physical exam. You may also have tests, such as:  Blood tests to determine if your body is fighting a bacterial infection.  Mammogram or ultrasound tests to rule out other problems or diseases.  Fluid tests. If an abscess has developed, the fluid in the abscess may be removed with a needle. The fluid may be analyzed to determine if bacteria are present.  Breast milk may be cultured and tested for bacteria.  How is this treated? This condition will sometimes go away on its own. Your health  care provider may choose to wait 24 hours after first seeing you to decide whether treatment is needed. If treatment is needed, it may include:  Strategies to manage breastfeeding. This includes continuing to breastfeed or pump in order to allow adequate milk flow, using breast massage, and applying heat or cold to the affected area.  Self-care such as rest and increased fluid intake.  Medicine for pain.  Antibiotic medicine to treat a bacterial infection. This  is usually taken by mouth.  If an abscess has developed, it may be treated by removing fluid with a needle.  Follow these instructions at home: Medicines  Take over-the-counter and prescription medicines only as told by your health care provider.  If you were prescribed an antibiotic medicine, take it as told by your health care provider. Do not stop taking the antibiotic even if you start to feel better. General instructions  Do not wear a tight or underwire bra. Wear a soft, supportive bra.  Increase your fluid intake, especially if you have a fever.  Get plenty of rest. For breastfeeding:  Continue to empty your breasts as often as possible, either by breastfeeding or using an electric breast pump. This will lower the pressure and the pain that comes with it. Ask your health care provider if changes need to be made to your breastfeeding or pumping routine.  Keep your nipples clean and dry.  During breastfeeding, empty the first breast completely before going to the other breast. If your baby is not emptying your breasts completely, use a breast pump to empty your breasts.  Use breast massage during feeding or pumping sessions.  If directed, apply moist heat to the affected area of your breast right before breastfeeding or pumping. Use the heat source that your health care provider recommends.  If directed, put ice on the affected area of your breast right after breastfeeding or pumping: ? Put ice in a plastic bag. ? Place a towel between your skin and the bag. ? Leave the ice on for 20 minutes.  If you go back to work, pump your breasts while at work to stay in time with your nursing schedule.  Do not allow your breasts to become engorged. Contact a health care provider if:  You have pus-like discharge from the breast.  You have a fever.  Your symptoms do not improve within 2 days of starting treatment.  Your symptoms return after you have recovered from a breast  infection. Get help right away if:  Your pain and swelling are getting worse.  You have pain that is not controlled with medicine.  You have a red line extending from the breast toward your armpit. Summary  Mastitis is inflammation of the breast tissue. It is often caused by a blocked milk duct or bacteria.  This condition may be treated with hot and cold compresses, medicines, self-care, and certain breastfeeding strategies.  If you were prescribed an antibiotic medicine, take it as told by your health care provider. Do not stop taking the antibiotic even if you start to feel better.  Continue to empty your breasts as often as possible either by breastfeeding or using an electric breast pump. This information is not intended to replace advice given to you by your health care provider. Make sure you discuss any questions you have with your health care provider. Document Released: 10/18/2004 Document Revised: 06/24/2016 Document Reviewed: 06/24/2016 Elsevier Interactive Patient Education  2017 [employer].

## 2017-05-21 NOTE — Progress Notes (Signed)
11/15/20182:23 PM  Michaela Williams 01/29/1996, 21 y.o. female [id number]  Chief Complaint  Patient presents with  . Breast Mass    follow up     HPI:   Patient is a 21 y.o. female who presents today for follow-up on left sided mastitis. She has been on keflex for about a week without much improvement. She continues to breastfeed. Breast is still very painful. Denies fever or chills.   Depression screen [facility] 2/9 05/14/2017 06/12/2016  Decreased Interest 3 0  Down, Depressed, Hopeless 2 0  PHQ - 2 Score 5 0  Altered sleeping 3 -  Tired, decreased energy 3 -  Change in appetite 3 -  Feeling bad or failure about yourself  2 -  Trouble concentrating 0 -  Moving slowly or fidgety/restless 3 -  Suicidal thoughts 0 -  PHQ-9 Score 19 -  Difficult doing work/chores Somewhat difficult -    Allergies  Allergen Reactions  . Abilify [Aripiprazole] Other (See Comments)    Did not like how it made her feel  . Lexapro [Escitalopram] Other (See Comments)    Didn't like how it made her feel  . Lithium     Childhood reaction    Prior to Admission medications   Medication Sig Start Date End Date Taking? Authorizing Provider  cephALEXin (KEFLEX) 500 MG capsule Take 1 capsule (500 mg total) 4 (four) times daily for 10 days by mouth. 05/14/17 05/24/17 Yes Rutherford Guys, MD  FLUoxetine (PROZAC) 40 MG capsule Take 1 capsule (40 mg total) by mouth 2 (two) times daily. 03/17/17  Yes Denney, Rachelle A, CNM  ibuprofen (ADVIL,MOTRIN) 600 MG tablet Take 1 tablet (600 mg total) every 6 (six) hours as needed by mouth. 05/21/17  Yes Rutherford Guys, MD  lurasidone (LATUDA) 40 MG TABS tablet Take 1 tablet (40 mg total) by mouth daily with breakfast. 03/17/17  Yes Denney, Rachelle A, CNM  Prenat-FeCbn-FeAspGl-FA-Omega (OB COMPLETE PETITE) 35-5-1-200 MG CAPS Take 1 tablet by mouth daily. 08/20/16  Yes Denney, Rachelle A, CNM  sertraline (ZOLOFT) 25 MG tablet Take 1 tablet (25 mg total) by mouth daily.  02/06/17  Yes Myrtis Ser, CNM    Past Medical History:  Diagnosis Date  . Anemia   . Asthma   . Depression    no meds in 3 yrs  . Headache   . Infection    UTI  . Seizures ([place]) 2014   "seizure like activity", reaction to medicaiton, none since    Past Surgical History:  Procedure Laterality Date  . NO PAST SURGERIES      Social History   Tobacco Use  . Smoking status: Former Smoker    Types: Cigarettes    Last attempt to quit: 12/19/2014    Years since quitting: 2.4  . Smokeless tobacco: Never Used  . Tobacco comment: age 60  Substance Use Topics  . Alcohol use: Yes    Comment: none since    Family History  Adopted: Yes  Problem Relation Age of Onset  . Mental illness Mother   . Cancer Mother   . Cancer Maternal Grandmother   . Hypertension Maternal Grandmother   . Diabetes Maternal Grandmother   . Cancer Maternal Aunt     ROS Per hpi  OBJECTIVE:  Blood pressure 110/78, pulse (!) 117, temperature 98.7 F (37.1 C), temperature source Oral, resp. rate 18, height 5' 3.62" (1.616 m), weight 117 lb 6.4 oz (53.3 kg), SpO2 99 %, currently breastfeeding.  Physical Exam  Constitutional: She is well-developed, well-nourished, and in no distress.  Pulmonary/Chest: Right breast exhibits no inverted nipple, no mass, no nipple discharge, no skin change and no tenderness. Breasts are asymmetrical.    Lymphadenopathy:       Right cervical: No superficial cervical adenopathy present.      Left cervical: No superficial cervical adenopathy present.    She has no axillary adenopathy.     ASSESSMENT and PLAN  1. Mastitis, left, acute Not resolved, changing to MRSA coverage abx. Continue wit supportive measures. New abx r/se/d discussed. Ordering [place] to evaluate for abscess.  - US BREAST COMPLETE UNI LEFT INC AXILLA; Future  Other orders - clindamycin (CLEOCIN) 300 MG capsule; Take 1 capsule (300 mg total) 3 (three) times daily by mouth. - saccharomyces boulardii  (FLORASTOR) 250 MG capsule; Take 1 capsule (250 mg total) 2 (two) times daily by mouth. - ibuprofen (ADVIL,MOTRIN) 600 MG tablet; Take 1 tablet (600 mg total) every 6 (six) hours as needed by mouth.  Return in about 1 week (around 05/28/2017).    Rutherford Guys, MD Primary Care at [place], [place] [address] Ph.  [phone] Fax [phone]

## 2017-05-25 ENCOUNTER — Other Ambulatory Visit: Payer: Self-pay

## 2017-05-25 ENCOUNTER — Ambulatory Visit
Admission: RE | Admit: 2017-05-25 | Discharge: 2017-05-25 | Disposition: A | Discharge status: Home / Self Care | Payer: PRIVATE HEALTH INSURANCE | Source: Ambulatory Visit | Attending: Family Medicine | Admitting: Family Medicine

## 2017-05-25 ENCOUNTER — Emergency Department (HOSPITAL_BASED_OUTPATIENT_CLINIC_OR_DEPARTMENT_OTHER): Payer: 59

## 2017-05-25 ENCOUNTER — Emergency Department (HOSPITAL_BASED_OUTPATIENT_CLINIC_OR_DEPARTMENT_OTHER)
Admission: EM | Admit: 2017-05-25 | Discharge: 2017-05-25 | Disposition: A | Discharge status: Home / Self Care | Payer: 59 | Attending: Emergency Medicine | Admitting: Emergency Medicine

## 2017-05-25 ENCOUNTER — Other Ambulatory Visit: Payer: Self-pay | Admitting: Family Medicine

## 2017-05-25 ENCOUNTER — Encounter (HOSPITAL_BASED_OUTPATIENT_CLINIC_OR_DEPARTMENT_OTHER): Payer: Self-pay | Admitting: *Deleted

## 2017-05-25 DIAGNOSIS — N61 Mastitis without abscess: Secondary | ICD-10-CM

## 2017-05-25 DIAGNOSIS — Z87891 Personal history of nicotine dependence: Secondary | ICD-10-CM | POA: Insufficient documentation

## 2017-05-25 DIAGNOSIS — R05 Cough: Secondary | ICD-10-CM | POA: Diagnosis present

## 2017-05-25 DIAGNOSIS — K920 Hematemesis: Secondary | ICD-10-CM

## 2017-05-25 DIAGNOSIS — Z79899 Other long term (current) drug therapy: Secondary | ICD-10-CM | POA: Diagnosis not present

## 2017-05-25 DIAGNOSIS — J45909 Unspecified asthma, uncomplicated: Secondary | ICD-10-CM | POA: Diagnosis not present

## 2017-05-25 LAB — COMPREHENSIVE METABOLIC PANEL
ALBUMIN: 4.7 g/dL (ref 3.5–5.0)
ALT: 32 U/L (ref 14–54)
ANION GAP: 9 (ref 5–15)
AST: 25 U/L (ref 15–41)
Alkaline Phosphatase: 94 U/L (ref 38–126)
BUN: 19 mg/dL (ref 6–20)
CHLORIDE: 103 mmol/L (ref 101–111)
CO2: 24 mmol/L (ref 22–32)
Calcium: 9.5 mg/dL (ref 8.9–10.3)
Creatinine, Ser: 0.88 mg/dL (ref 0.44–1.00)
GFR calc Af Amer: >60 mL/min (ref >60)
GFR calc non Af Amer: >60 mL/min (ref >60)
GLUCOSE: 121 mg/dL — AB (ref 65–99)
POTASSIUM: 3.4 mmol/L — AB (ref 3.5–5.1)
SODIUM: 136 mmol/L (ref 135–145)
Total Bilirubin: 0.9 mg/dL (ref 0.3–1.2)
Total Protein: 8.7 g/dL — ABNORMAL HIGH (ref 6.5–8.1)

## 2017-05-25 LAB — CBC WITH DIFFERENTIAL/PLATELET
Basophils Absolute: 0 10*3/uL (ref 0.0–0.1)
Basophils Relative: 0 %
EOS PCT: 1 %
Eosinophils Absolute: 0 10*3/uL (ref 0.0–0.7)
HEMATOCRIT: 38.5 % (ref 36.0–46.0)
Hemoglobin: 12.9 g/dL (ref 12.0–15.0)
LYMPHS ABS: 1.8 10*3/uL (ref 0.7–4.0)
LYMPHS PCT: 32 %
MCH: 29.1 pg (ref 26.0–34.0)
MCHC: 33.5 g/dL (ref 30.0–36.0)
MCV: 86.7 fL (ref 78.0–100.0)
MONO ABS: 0.6 10*3/uL (ref 0.1–1.0)
Monocytes Relative: 10 %
NEUTROS ABS: 3.2 10*3/uL (ref 1.7–7.7)
Neutrophils Relative %: 57 %
PLATELETS: 251 10*3/uL (ref 150–400)
RBC: 4.44 MIL/uL (ref 3.87–5.11)
RDW: 14.2 % (ref 11.5–15.5)
WBC: 5.6 10*3/uL (ref 4.0–10.5)

## 2017-05-25 LAB — D-DIMER, QUANTITATIVE: D-Dimer, Quant: 0.41 ug/mL-FEU (ref 0.00–0.50)

## 2017-05-25 LAB — LIPASE, BLOOD: Lipase: 21 U/L (ref 11–51)

## 2017-05-25 MED ORDER — ONDANSETRON HCL 4 MG/2ML IJ SOLN
INTRAMUSCULAR | Status: AC
  Administered 2017-05-25: 4 mg via INTRAVENOUS
  Filled 2017-05-25: qty 2

## 2017-05-25 MED ORDER — DIPHENHYDRAMINE HCL 25 MG PO CAPS
25.0000 mg | ORAL_CAPSULE | Freq: Once | ORAL | Status: AC
  Administered 2017-05-25: 25 mg via ORAL

## 2017-05-25 MED ORDER — SODIUM CHLORIDE 0.9 % IV BOLUS (SEPSIS)
1000.0000 mL | Freq: Once | INTRAVENOUS | Status: AC
  Administered 2017-05-25: 1000 mL via INTRAVENOUS

## 2017-05-25 MED ORDER — OMEPRAZOLE 20 MG PO CPDR: 20.0000 mg | DELAYED_RELEASE_CAPSULE | Freq: Every day | ORAL | 0 refills | Status: DC

## 2017-05-25 MED ORDER — PANTOPRAZOLE SODIUM 40 MG IV SOLR
40.0000 mg | Freq: Once | INTRAVENOUS | Status: AC
  Administered 2017-05-25: 40 mg via INTRAVENOUS
  Filled 2017-05-25: qty 40

## 2017-05-25 MED ORDER — DIPHENHYDRAMINE HCL 25 MG PO CAPS
ORAL_CAPSULE | ORAL | Status: AC
  Administered 2017-05-25: 25 mg via ORAL
  Filled 2017-05-25: qty 1

## 2017-05-25 MED ORDER — ONDANSETRON HCL 4 MG/2ML IJ SOLN
4.0000 mg | Freq: Once | INTRAMUSCULAR | Status: AC
  Administered 2017-05-25: 4 mg via INTRAVENOUS

## 2017-05-25 NOTE — ED Notes (Signed)
Pt to xray via stretcher
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
## 2017-05-25 NOTE — ED Triage Notes (Signed)
Here for coughing up blood. Onset Saturday. Relates sx to accidentally taking two antibiotics at the same time (keflex and clinda). Stopped keflex, continues clinda, but sx continues. Also reports: migraine, CP, abd pain, NV, dizziness and sob. (denies: diarrhea or fever). Rates pain 7/10. Sx "worse when standing". Sx "intermittent".  ABT treating L breast mastitis. Still breastfeeding. Fell yesterday d/t dizziness.   Alert, NAD, calm, interactive, resps e/u, speaking in clear complete sentences, no dyspnea noted, skin W&D, VSS. Family x2 at [facility].

## 2017-05-25 NOTE — ED Notes (Signed)
Back from xray, shaking and tremors no longer present. Pt verbalized needing a break from family. Family not at [facility] at this time. Visitor restrictions put in place. EDP aware and into speak with pt. Depression and being overwhelmed by family verbalized. Denies SI/HI. Admits to taking zoloft and prozac as prescribed and attending counseling.   No visitors at this time explained to family in w/r with non-descript rationale. Family not happy, concerned and agreeable. Pt made aware of new family members here to see her. "Reassuring results at this point in time", "reassuring VS" explained to family.  Pt resting, alert, NAD, calm, VSS.

## 2017-05-25 NOTE — ED Notes (Addendum)
Family out to share concern for pt shaking. Concerned about a reaction to medication. IVF infusing. Tremors/ rigors/ shivering present. Pt denies feeling cold. VSS. EDP notified.  Remains alert, NAD, calm, interactive, resps e/u, speaking clearly, answering questions, following commands, no dyspnea noted, skin W&D. Family at [facility]. No emesis since arrival. Labs resulted and reassuring.

## 2017-05-25 NOTE — Discharge Instructions (Signed)
He was seen today for vomiting blood.  This could be related to gastritis or an ulcer.  You need to see gastroenterology for an endoscopy.  Take omeprazole daily.  If you have any worsening bleeding or any new or worsening symptoms you should be reevaluated.

## 2017-06-02 ENCOUNTER — Encounter (HOSPITAL_COMMUNITY): Payer: Self-pay | Admitting: Emergency Medicine

## 2017-06-02 ENCOUNTER — Emergency Department (HOSPITAL_COMMUNITY): Payer: 59

## 2017-06-02 ENCOUNTER — Observation Stay (HOSPITAL_COMMUNITY)
Admission: EM | Admit: 2017-06-02 | Discharge: 2017-06-04 | Disposition: A | Discharge status: Home / Self Care | Payer: 59 | Attending: Family Medicine | Admitting: Family Medicine

## 2017-06-02 ENCOUNTER — Other Ambulatory Visit: Payer: Self-pay

## 2017-06-02 ENCOUNTER — Inpatient Hospital Stay
Admission: RE | Admit: 2017-06-02 | Discharge: 2017-06-02 | Disposition: A | Discharge status: Home / Self Care | Payer: 59 | Source: Ambulatory Visit | Attending: Family Medicine | Admitting: Family Medicine

## 2017-06-02 DIAGNOSIS — K922 Gastrointestinal hemorrhage, unspecified: Secondary | ICD-10-CM

## 2017-06-02 DIAGNOSIS — Z87891 Personal history of nicotine dependence: Secondary | ICD-10-CM | POA: Diagnosis not present

## 2017-06-02 DIAGNOSIS — Z888 Allergy status to other drugs, medicaments and biological substances status: Secondary | ICD-10-CM | POA: Diagnosis not present

## 2017-06-02 DIAGNOSIS — R112 Nausea with vomiting, unspecified: Secondary | ICD-10-CM

## 2017-06-02 DIAGNOSIS — R1084 Generalized abdominal pain: Secondary | ICD-10-CM | POA: Insufficient documentation

## 2017-06-02 DIAGNOSIS — Z79899 Other long term (current) drug therapy: Secondary | ICD-10-CM | POA: Diagnosis not present

## 2017-06-02 DIAGNOSIS — C50919 Malignant neoplasm of unspecified site of unspecified female breast: Secondary | ICD-10-CM | POA: Diagnosis present

## 2017-06-02 DIAGNOSIS — K920 Hematemesis: Principal | ICD-10-CM | POA: Insufficient documentation

## 2017-06-02 DIAGNOSIS — F319 Bipolar disorder, unspecified: Secondary | ICD-10-CM | POA: Diagnosis not present

## 2017-06-02 DIAGNOSIS — K2950 Unspecified chronic gastritis without bleeding: Secondary | ICD-10-CM | POA: Diagnosis not present

## 2017-06-02 DIAGNOSIS — J45909 Unspecified asthma, uncomplicated: Secondary | ICD-10-CM | POA: Diagnosis not present

## 2017-06-02 HISTORY — DX: Gastrointestinal hemorrhage, unspecified: K92.2

## 2017-06-02 HISTORY — DX: Malignant (primary) neoplasm, unspecified: C80.1

## 2017-06-02 LAB — CBC WITH DIFFERENTIAL/PLATELET
BASOS ABS: 0 10*3/uL (ref 0.0–0.1)
BASOS PCT: 0 %
EOS PCT: 0 %
Eosinophils Absolute: 0 10*3/uL (ref 0.0–0.7)
HEMATOCRIT: 36.7 % (ref 36.0–46.0)
Hemoglobin: 12.1 g/dL (ref 12.0–15.0)
Lymphocytes Relative: 19 %
Lymphs Abs: 1.4 10*3/uL (ref 0.7–4.0)
MCH: 29.3 pg (ref 26.0–34.0)
MCHC: 33 g/dL (ref 30.0–36.0)
MCV: 88.9 fL (ref 78.0–100.0)
MONO ABS: 0.5 10*3/uL (ref 0.1–1.0)
Monocytes Relative: 7 %
NEUTROS ABS: 5.4 10*3/uL (ref 1.7–7.7)
Neutrophils Relative %: 74 %
Platelets: 247 10*3/uL (ref 150–400)
RBC: 4.13 MIL/uL (ref 3.87–5.11)
RDW: 14.7 % (ref 11.5–15.5)
WBC: 7.3 10*3/uL (ref 4.0–10.5)

## 2017-06-02 LAB — URINALYSIS, ROUTINE W REFLEX MICROSCOPIC
BILIRUBIN URINE: NEGATIVE
Glucose, UA: NEGATIVE mg/dL
HGB URINE DIPSTICK: NEGATIVE
KETONES UR: NEGATIVE mg/dL
Nitrite: NEGATIVE
PROTEIN: NEGATIVE mg/dL
SPECIFIC GRAVITY, URINE: 1.021 (ref 1.005–1.030)
pH: 6 (ref 5.0–8.0)

## 2017-06-02 LAB — TYPE AND SCREEN
ABO/RH(D): A POS
Antibody Screen: NEGATIVE

## 2017-06-02 LAB — COMPREHENSIVE METABOLIC PANEL
ALBUMIN: 4.5 g/dL (ref 3.5–5.0)
ALK PHOS: 89 U/L (ref 38–126)
ALT: 23 U/L (ref 14–54)
ANION GAP: 7 (ref 5–15)
AST: 20 U/L (ref 15–41)
BILIRUBIN TOTAL: 1.2 mg/dL (ref 0.3–1.2)
BUN: 14 mg/dL (ref 6–20)
CALCIUM: 9.6 mg/dL (ref 8.9–10.3)
CO2: 26 mmol/L (ref 22–32)
Chloride: 106 mmol/L (ref 101–111)
Creatinine, Ser: 0.7 mg/dL (ref 0.44–1.00)
GLUCOSE: 97 mg/dL (ref 65–99)
Potassium: 3.7 mmol/L (ref 3.5–5.1)
Sodium: 139 mmol/L (ref 135–145)
TOTAL PROTEIN: 7.7 g/dL (ref 6.5–8.1)

## 2017-06-02 LAB — PROTIME-INR
INR: 1.11
Prothrombin Time: 14.2 seconds (ref 11.4–15.2)

## 2017-06-02 LAB — POC OCCULT BLOOD, ED: FECAL OCCULT BLD: NEGATIVE

## 2017-06-02 LAB — LIPASE, BLOOD: LIPASE: 22 U/L (ref 11–51)

## 2017-06-02 LAB — I-STAT BETA HCG BLOOD, ED (MC, WL, AP ONLY)

## 2017-06-02 LAB — ABO/RH: ABO/RH(D): A POS

## 2017-06-02 MED ORDER — PRENATAL MULTIVITAMIN CH
1.0000 | ORAL_TABLET | Freq: Every day | ORAL | Status: DC
  Administered 2017-06-03 – 2017-06-04 (×2): 1 via ORAL
  Filled 2017-06-02 (×2): qty 1

## 2017-06-02 MED ORDER — IOPAMIDOL (ISOVUE-300) INJECTION 61%
100.0000 mL | Freq: Once | INTRAVENOUS | Status: AC | PRN
  Administered 2017-06-02: 100 mL via INTRAVENOUS

## 2017-06-02 MED ORDER — IOPAMIDOL (ISOVUE-300) INJECTION 61%
30.0000 mL | Freq: Once | INTRAVENOUS | Status: AC | PRN
  Administered 2017-06-02: 30 mL via ORAL

## 2017-06-02 MED ORDER — OB COMPLETE PETITE 35-5-1-200 MG PO CAPS: 1.0000 | ORAL_CAPSULE | Freq: Every day | ORAL | Status: DC

## 2017-06-02 MED ORDER — SODIUM CHLORIDE 0.9 % IV BOLUS (SEPSIS)
1000.0000 mL | Freq: Once | INTRAVENOUS | Status: AC
  Administered 2017-06-02: 1000 mL via INTRAVENOUS

## 2017-06-02 MED ORDER — SACCHAROMYCES BOULARDII 250 MG PO CAPS
250.0000 mg | ORAL_CAPSULE | Freq: Two times a day (BID) | ORAL | Status: DC
  Administered 2017-06-02 – 2017-06-04 (×3): 250 mg via ORAL
  Filled 2017-06-02 (×3): qty 1

## 2017-06-02 MED ORDER — POLYETHYLENE GLYCOL 3350 17 G PO PACK: 17.0000 g | PACK | Freq: Every day | ORAL | Status: DC | PRN

## 2017-06-02 MED ORDER — FENTANYL CITRATE (PF) 100 MCG/2ML IJ SOLN
100.0000 ug | Freq: Once | INTRAMUSCULAR | Status: AC
  Administered 2017-06-02: 100 ug via INTRAVENOUS
  Filled 2017-06-02: qty 2

## 2017-06-02 MED ORDER — PANTOPRAZOLE SODIUM 40 MG IV SOLR
40.0000 mg | Freq: Two times a day (BID) | INTRAVENOUS | Status: DC
  Administered 2017-06-02: 40 mg via INTRAVENOUS
  Filled 2017-06-02: qty 40

## 2017-06-02 MED ORDER — POTASSIUM CHLORIDE IN NACL 20-0.9 MEQ/L-% IV SOLN
INTRAVENOUS | Status: AC
  Administered 2017-06-02: 22:00:00 via INTRAVENOUS
  Filled 2017-06-02 (×3): qty 1000

## 2017-06-02 MED ORDER — SERTRALINE HCL 25 MG PO TABS
25.0000 mg | ORAL_TABLET | Freq: Every day | ORAL | Status: DC
  Administered 2017-06-02 – 2017-06-04 (×3): 25 mg via ORAL
  Filled 2017-06-02 (×3): qty 1

## 2017-06-02 MED ORDER — LURASIDONE HCL 40 MG PO TABS
40.0000 mg | ORAL_TABLET | Freq: Every day | ORAL | Status: DC
  Administered 2017-06-03 – 2017-06-04 (×3): 40 mg via ORAL
  Filled 2017-06-02 (×2): qty 1

## 2017-06-02 MED ORDER — PANTOPRAZOLE SODIUM 40 MG IV SOLR
40.0000 mg | Freq: Once | INTRAVENOUS | Status: DC
  Filled 2017-06-02: qty 40

## 2017-06-02 MED ORDER — IOPAMIDOL (ISOVUE-300) INJECTION 61%
INTRAVENOUS | Status: AC
  Administered 2017-06-02: 30 mL via ORAL
  Filled 2017-06-02: qty 30

## 2017-06-02 MED ORDER — IOPAMIDOL (ISOVUE-300) INJECTION 61%
INTRAVENOUS | Status: AC
  Filled 2017-06-02: qty 100

## 2017-06-02 NOTE — ED Triage Notes (Signed)
Per EMS, pt was at work when she started vomiting with bright red blood present. Pt was recently diagnosed with stomach ulcers over a week ago. Pt also reports being recently diagnosed with breast cancer.

## 2017-06-02 NOTE — ED Notes (Signed)
Attempted to give report and secretary stated the nurse was in report and unable to receive report.
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
## 2017-06-02 NOTE — ED Notes (Signed)
Assigned 1523 @18 :19 call consult @ 18:39

## 2017-06-02 NOTE — H&P (Signed)
TRH H&P   Patient Demographics:    Michaela Williams, is a 21 y.o. female  MRN: [id number]   DOB - [birth year]  Admit Date - 06/02/2017  Outpatient Primary MD for the patient is Joretta Bachelor, [place]  Outpatient Specialists: At [facility]  Patient coming from: Home  Chief Complaint  Patient presents with  . Abdominal Pain      HPI:    Michaela Williams  is a 21 y.o. female, depression, had a baby 3 months ago keeping good health since then, questionable recent diagnosis of left sided breast cancer in [place] a few days ago now resolved to see Dr. Payton Mccallum locally in the next few days, past month, who takes NSAIDs as needed at home comes in with one-week history of throwing bright red blood.  Patient states that she had an episode of emesis with bright red blood in it 1 week ago, she had 3-4 episodes with mild epigastric pain and nausea this subsided however today she had again 3 episodes of the same after which she came to the ER.    She has mild nonradiating epigastric abdominal pain associated with emesis of bright red blood per, no aggravating relieving factors, pain ongoing off and on for 1 week.  The ER her workup including hemoglobin and BUN were stable, her case was discussed with GI physician on call by the ED physician they were instructed to give the patient IV PPI keep her n.p.o. after for a EGD in the morning.  Patient is appearing nontoxic and in no distress, Haem Occ negative.    Review of systems:    In addition to the HPI above,  No Fever-chills, No Headache, No changes with Vision or hearing, No problems swallowing food or Liquids, No Chest pain, Cough or Shortness of Breath, As above  Abdominal pain, Nausea & Vommitting, Bowel movements are regular, No Blood in stool or Urine, No dysuria, No new skin rashes or bruises, No new joints pains-aches,  No new weakness, tingling, numbness in any extremity, No recent weight gain or loss, No polyuria, polydypsia or polyphagia, No significant Mental Stressors.  A full 10 point Review of Systems was done, except as stated above, all other Review of Systems were negative.   With Past History of the following :    Past Medical History:  Diagnosis Date  . Anemia   . Asthma   . breast ca dx'd 05/2017   left  . Depression    no meds in 3 yrs  . Headache   . Infection    UTI  . Seizures ([place]) 2014   "seizure like activity", reaction to medicaiton, none since      Past Surgical History:  Procedure Laterality Date  . NO PAST SURGERIES        Social History:     Social History   Tobacco Use  . Smoking status: Former Smoker    Types: Cigarettes    Last attempt to quit: 12/19/2014    Years since quitting: 2.4  . Smokeless tobacco: Never Used  . Tobacco comment: age 3  Substance Use Topics  . Alcohol use: Yes    Comment: occasionally         Family History :     Family History  Adopted: Yes  Problem Relation Age of Onset  . Mental illness Mother   . Cancer Mother   . Breast cancer Mother   . Cancer Maternal Grandmother   . Hypertension Maternal Grandmother   . Diabetes Maternal Grandmother   . Cancer Maternal Aunt   . Breast cancer Maternal Aunt   . Breast cancer Sister   . Breast cancer Maternal Aunt   . Breast cancer Maternal Aunt   . Breast cancer Maternal Aunt   . Breast cancer Maternal Aunt        Home Medications:   Prior to Admission medications   Medication Sig Start Date End Date Taking? Authorizing Provider  clindamycin (CLEOCIN) 300 MG capsule Take 1 capsule (300 mg total) 3 (three) times daily by mouth. 05/21/17  Yes Rutherford Guys, MD  Etonogestrel [facility]) Inject 1  application into the skin. Currently implanted   Yes [provider]  FLUoxetine (PROZAC) 40 MG capsule Take 1 capsule (40 mg total) by mouth 2 (two) times daily. 03/17/17  Yes Denney, Rachelle A, CNM  ibuprofen (ADVIL,MOTRIN) 600 MG tablet Take 1 tablet (600 mg total) every 6 (six) hours as needed by mouth. 05/21/17  Yes Rutherford Guys, MD  Multiple Vitamins-Calcium (ONE-A-DAY WOMENS FORMULA) TABS Take 1 tablet by mouth daily.   Yes [provider]  Prenat-FeCbn-FeAspGl-FA-Omega (OB COMPLETE PETITE) 35-5-1-200 MG CAPS Take 1 tablet by mouth daily. 08/20/16  Yes Denney, Rachelle A, CNM  sertraline (ZOLOFT) 25 MG tablet Take 1 tablet (25 mg total) by mouth daily. 02/06/17  Yes Myrtis Ser, CNM  saccharomyces boulardii (FLORASTOR) 250 MG capsule Take 1 capsule (250 mg total) 2 (two) times daily by mouth. Patient not taking: Reported on 06/02/2017 05/21/17   Rutherford Guys, MD     Allergies:     Allergies  Allergen Reactions  . Abilify [Aripiprazole] Other (See Comments)    Did not like how it made her feel  . Lexapro [Escitalopram] Other (See Comments)    Didn't like how it made her feel  . Lithium     SEIZURES, Childhood reaction     Physical Exam:   Vitals  Blood pressure 103/71, pulse 79, temperature 98.6 F (37 C), temperature source Oral, resp. rate 18, height 5\' 1"  (1.549 m), weight 53.1 kg (117 lb), SpO2 98 %, currently breastfeeding.   1. General Young pleasant African-American female in bed in no apparent distress,  2. Normal affect and insight, Not Suicidal or Homicidal, Awake Alert, Oriented X 3.  3. No F.N deficits,  ALL C.Nerves Intact, Strength 5/5 all 4 extremities, Sensation intact all 4 extremities, Plantars down going.  4. Ears and Eyes appear Normal, Conjunctivae clear, PERRLA. Moist Oral Mucosa.  5. Supple Neck, No JVD, No cervical lymphadenopathy appriciated, No Carotid Bruits.  6. Symmetrical Chest wall movement, Good air movement  bilaterally, CTAB.  7. RRR, No Gallops, Rubs or Murmurs, No Parasternal Heave.  8. Positive Bowel Sounds, Abdomen Soft, minimal epigastric tenderness, No organomegaly appriciated,No rebound -guarding or rigidity.  Hemoccult negative checked by ER.  9.  No Cyanosis, Normal Skin Turgor, No Skin Rash or Bruise.  10. Good muscle tone,  joints appear normal , no effusions, Normal ROM.  11. No Palpable Lymph Nodes in Neck or Axillae      Data Review:    CBC Recent Labs  Lab 06/02/17 1019  WBC 7.3  HGB 12.1  HCT 36.7  PLT 247  MCV 88.9  MCH 29.3  MCHC 33.0  RDW 14.7  LYMPHSABS 1.4  MONOABS 0.5  EOSABS 0.0  BASOSABS 0.0   ------------------------------------------------------------------------------------------------------------------  Chemistries  Recent Labs  Lab 06/02/17 1019  NA 139  K 3.7  CL 106  CO2 26  GLUCOSE 97  BUN 14  CREATININE 0.70  CALCIUM 9.6  AST 20  ALT 23  ALKPHOS 89  BILITOT 1.2   ------------------------------------------------------------------------------------------------------------------ estimated creatinine clearance is 83.2 mL/min (by C-G formula based on SCr of 0.7 mg/dL). ------------------------------------------------------------------------------------------------------------------ No results for input(s): TSH, T4TOTAL, T3FREE, THYROIDAB in the last 72 hours.  Invalid input(s): FREET3  Coagulation profile No results for input(s): INR, PROTIME in the last 168 hours. ------------------------------------------------------------------------------------------------------------------- No results for input(s): DDIMER in the last 72 hours. -------------------------------------------------------------------------------------------------------------------  Cardiac Enzymes No results for input(s): CKMB, TROPONINI, MYOGLOBIN in the last 168 hours.  Invalid input(s):  CK ------------------------------------------------------------------------------------------------------------------ No results found for: BNP   ---------------------------------------------------------------------------------------------------------------  Urinalysis    Component Value Date/Time   COLORURINE YELLOW 06/02/2017 1320   APPEARANCEUR CLEAR 06/02/2017 1320   LABSPEC 1.021 06/02/2017 1320   PHURINE 6.0 06/02/2017 1320   GLUCOSEU NEGATIVE 06/02/2017 1320   HGBUR NEGATIVE 06/02/2017 1320   BILIRUBINUR NEGATIVE 06/02/2017 1320   BILIRUBINUR neg 11/26/2016 1459   KETONESUR NEGATIVE 06/02/2017 1320   PROTEINUR NEGATIVE 06/02/2017 1320   UROBILINOGEN 0.2 11/26/2016 1459   NITRITE NEGATIVE 06/02/2017 1320   LEUKOCYTESUR LARGE (A) 06/02/2017 1320    ----------------------------------------------------------------------------------------------------------------   Imaging Results:    Ct Abdomen Pelvis W Contrast  Result Date: 06/02/2017 CLINICAL DATA:  Acute mid abdominal pain, hemoptysis. EXAM: CT ABDOMEN AND PELVIS WITH CONTRAST TECHNIQUE: Multidetector CT imaging of the abdomen and pelvis was performed using the standard protocol following bolus administration of intravenous contrast. CONTRAST:  123mL ISOVUE-300 IOPAMIDOL (ISOVUE-300) INJECTION 61% COMPARISON:  None. FINDINGS: Lower chest: No acute abnormality. Hepatobiliary: No focal liver abnormality is seen. No gallstones, gallbladder wall thickening, or biliary dilatation. Pancreas: Unremarkable. No pancreatic ductal dilatation or surrounding inflammatory changes. Spleen: Normal in size without focal abnormality. Adrenals/Urinary Tract: Adrenal glands are unremarkable. Kidneys are normal, without renal calculi, focal lesion, or hydronephrosis. Bladder is unremarkable. Stomach/Bowel: Stomach is within normal limits. Appendix appears normal. No evidence of bowel wall thickening, distention, or inflammatory changes. Stool is  noted throughout the colon. Vascular/Lymphatic: No significant vascular findings are present. No enlarged abdominal or pelvic lymph nodes. Reproductive: Uterus and bilateral adnexa are unremarkable. Other: No abdominal wall hernia or abnormality. No abdominopelvic ascites. Musculoskeletal: No acute or significant osseous findings. IMPRESSION: Stool is noted throughout the colon. No  acute abnormality seen in the abdomen or pelvis. Electronically Signed   By: Marijo Conception, M.D.   On: 06/02/2017 14:56   Dg Abdomen Acute W/chest  Result Date: 06/02/2017 CLINICAL DATA:  Abdominal pain for 2 weeks with vomiting for 1 day. EXAM: DG ABDOMEN ACUTE W/ 1V CHEST COMPARISON:  05/25/2017 chest radiographs FINDINGS: Insert cardio the cardiomediastinal silhouette is unremarkable. The lungs are clear. No pleural effusion or pneumothorax noted. The bowel gas pattern is unremarkable. There is no evidence of dilated bowel loops, bowel obstruction, pneumoperitoneum or worrisome calcifications. The bony structures are unremarkable. IMPRESSION: Negative abdominal radiographs.  No acute cardiopulmonary disease. Electronically Signed   By: Margarette Canada M.D.   On: 06/02/2017 13:25        Assessment & Plan:     1.  Hematemesis.  Could be due to NSAID induced gastric ulceration, however I cannot think there was any amount of significant blood loss, her H&H is stable, BUN levels are unremarkable, she was Hemoccult negative, at this time she will be kept in 23-hour observation as per GI recommendation, IV PPI twice daily, clear liquid diet for now, n.p.o. after midnight, EGD tomorrow.  For precaution hypertension type screen.  Will check INR and CBC as well in the morning.  2.  Recent diagnosis of left-sided breast cancer.  Evidently she felt a lump a few days ago and then had a biopsy done in [place], results are not available, she is getting the records transferred to local cancer center here for a second opinion with Dr.  Payton Mccallum.  No acute issues will have her follow with oncology outpatient.  3.  History of depression/bipolar disorder.  Home medications continue.  Currently not suicidal homicidal.   Patient requests that none of her medical information be shared by family.   DVT Prophylaxis   SCDs    AM Labs Ordered, also please review Full Orders  Family Communication: Admission, patients condition and plan of care including tests being ordered have been discussed with the patient  who indicates understanding and agree with the plan and Code Status.  Code Status Full  Likely DC to  Home 1-2 days  Condition Fair  Consults called: GI by ER, EGD in am    Admission status: Obs    Time spent in minutes : 35   Lala Lund M.D on 06/02/2017 at 6:20 PM  Between 7am to 7pm - Pager - [phone] ( page via [facility], text pages only, please mention full 10 digit call back number).  After 7pm go to [email or web address] - password [facility]  [facility] - Office  [phone]

## 2017-06-02 NOTE — ED Notes (Signed)
Patient made aware urine sample is needed prior to XR.

## 2017-06-02 NOTE — ED Notes (Signed)
Bed: WA22 Expected date:  Expected time:  Means of arrival:  Comments: EMS-vomiting blood 

## 2017-06-03 ENCOUNTER — Encounter (HOSPITAL_COMMUNITY): Payer: Self-pay | Admitting: Internal Medicine

## 2017-06-03 ENCOUNTER — Observation Stay (HOSPITAL_COMMUNITY): Payer: 59 | Admitting: Anesthesiology

## 2017-06-03 ENCOUNTER — Encounter (HOSPITAL_COMMUNITY)
Admission: EM | Disposition: A | Discharge status: Home / Self Care | Payer: Self-pay | Source: Home / Self Care | Attending: Emergency Medicine

## 2017-06-03 DIAGNOSIS — K922 Gastrointestinal hemorrhage, unspecified: Secondary | ICD-10-CM

## 2017-06-03 DIAGNOSIS — R1010 Upper abdominal pain, unspecified: Secondary | ICD-10-CM

## 2017-06-03 DIAGNOSIS — K921 Melena: Secondary | ICD-10-CM | POA: Diagnosis not present

## 2017-06-03 DIAGNOSIS — K2970 Gastritis, unspecified, without bleeding: Secondary | ICD-10-CM

## 2017-06-03 DIAGNOSIS — K920 Hematemesis: Secondary | ICD-10-CM

## 2017-06-03 DIAGNOSIS — R1084 Generalized abdominal pain: Secondary | ICD-10-CM

## 2017-06-03 DIAGNOSIS — R112 Nausea with vomiting, unspecified: Secondary | ICD-10-CM

## 2017-06-03 HISTORY — PX: ESOPHAGOGASTRODUODENOSCOPY (EGD) WITH PROPOFOL: SHX5813

## 2017-06-03 LAB — COMPREHENSIVE METABOLIC PANEL
ALBUMIN: 3.6 g/dL (ref 3.5–5.0)
ALK PHOS: 77 U/L (ref 38–126)
ALT: 18 U/L (ref 14–54)
ANION GAP: 5 (ref 5–15)
AST: 17 U/L (ref 15–41)
BUN: 12 mg/dL (ref 6–20)
CALCIUM: 8.9 mg/dL (ref 8.9–10.3)
CO2: 23 mmol/L (ref 22–32)
Chloride: 110 mmol/L (ref 101–111)
Creatinine, Ser: 0.62 mg/dL (ref 0.44–1.00)
GFR calc Af Amer: >60 mL/min (ref >60)
GFR calc non Af Amer: >60 mL/min (ref >60)
GLUCOSE: 88 mg/dL (ref 65–99)
Potassium: 3.9 mmol/L (ref 3.5–5.1)
SODIUM: 138 mmol/L (ref 135–145)
Total Bilirubin: 1.6 mg/dL — ABNORMAL HIGH (ref 0.3–1.2)
Total Protein: 6.3 g/dL — ABNORMAL LOW (ref 6.5–8.1)

## 2017-06-03 LAB — CBC
HCT: 33.3 % — ABNORMAL LOW (ref 36.0–46.0)
HEMOGLOBIN: 10.8 g/dL — AB (ref 12.0–15.0)
MCH: 29 pg (ref 26.0–34.0)
MCHC: 32.4 g/dL (ref 30.0–36.0)
MCV: 89.3 fL (ref 78.0–100.0)
Platelets: 218 10*3/uL (ref 150–400)
RBC: 3.73 MIL/uL — AB (ref 3.87–5.11)
RDW: 14.7 % (ref 11.5–15.5)
WBC: 4.5 10*3/uL (ref 4.0–10.5)

## 2017-06-03 SURGERY — ESOPHAGOGASTRODUODENOSCOPY (EGD) WITH PROPOFOL
Anesthesia: Monitor Anesthesia Care

## 2017-06-03 MED ORDER — HYOSCYAMINE SULFATE 0.125 MG/ML PO SOLN
0.2500 mg | Freq: Three times a day (TID) | ORAL | Status: DC
  Filled 2017-06-03: qty 2

## 2017-06-03 MED ORDER — PANTOPRAZOLE SODIUM 40 MG PO TBEC
40.0000 mg | DELAYED_RELEASE_TABLET | Freq: Two times a day (BID) | ORAL | Status: DC
  Administered 2017-06-03 – 2017-06-04 (×2): 40 mg via ORAL
  Filled 2017-06-03 (×2): qty 1

## 2017-06-03 MED ORDER — PROPOFOL 500 MG/50ML IV EMUL
INTRAVENOUS | Status: DC | PRN
  Administered 2017-06-03: 120 ug/kg/min via INTRAVENOUS

## 2017-06-03 MED ORDER — ONDANSETRON HCL 4 MG/2ML IJ SOLN: 4.0000 mg | Freq: Four times a day (QID) | INTRAMUSCULAR | Status: DC | PRN

## 2017-06-03 MED ORDER — LIDOCAINE 2% (20 MG/ML) 5 ML SYRINGE
INTRAMUSCULAR | Status: AC
  Filled 2017-06-03: qty 5

## 2017-06-03 MED ORDER — PROPOFOL 10 MG/ML IV BOLUS
INTRAVENOUS | Status: AC
  Filled 2017-06-03: qty 40

## 2017-06-03 MED ORDER — MORPHINE SULFATE (PF) 2 MG/ML IV SOLN
INTRAVENOUS | Status: AC
  Filled 2017-06-03: qty 1

## 2017-06-03 MED ORDER — HYOSCYAMINE SULFATE 0.125 MG/5ML PO ELIX
0.2500 mg | ORAL_SOLUTION | Freq: Three times a day (TID) | ORAL | Status: DC
  Filled 2017-06-03 (×23): qty 10

## 2017-06-03 MED ORDER — LIDOCAINE 2% (20 MG/ML) 5 ML SYRINGE
INTRAMUSCULAR | Status: DC | PRN
  Administered 2017-06-03: 100 mg via INTRAVENOUS

## 2017-06-03 MED ORDER — HYOSCYAMINE SULFATE 0.125 MG/5ML PO ELIX
0.2500 mg | ORAL_SOLUTION | Freq: Three times a day (TID) | ORAL | Status: DC
  Filled 2017-06-03 (×2): qty 10

## 2017-06-03 MED ORDER — BUTAMBEN-TETRACAINE-BENZOCAINE 2-2-14 % EX AERO
INHALATION_SPRAY | CUTANEOUS | Status: DC | PRN
  Administered 2017-06-03: 1 via TOPICAL

## 2017-06-03 MED ORDER — TRAMADOL HCL 50 MG PO TABS: 50.0000 mg | ORAL_TABLET | Freq: Four times a day (QID) | ORAL | Status: DC | PRN

## 2017-06-03 MED ORDER — POLYETHYLENE GLYCOL 3350 17 G PO PACK
17.0000 g | PACK | Freq: Every day | ORAL | Status: DC
  Administered 2017-06-03 – 2017-06-04 (×2): 17 g via ORAL
  Filled 2017-06-03: qty 1

## 2017-06-03 MED ORDER — PROPOFOL 10 MG/ML IV BOLUS
INTRAVENOUS | Status: DC | PRN
  Administered 2017-06-03 (×2): 20 mg via INTRAVENOUS

## 2017-06-03 MED ORDER — LACTATED RINGERS IV SOLN
INTRAVENOUS | Status: DC
  Administered 2017-06-03: 11:00:00 via INTRAVENOUS

## 2017-06-03 MED ORDER — MORPHINE SULFATE (PF) 2 MG/ML IV SOLN
2.0000 mg | INTRAVENOUS | Status: DC | PRN
  Administered 2017-06-03: 2 mg via INTRAVENOUS

## 2017-06-03 SURGICAL SUPPLY — 14 items

## 2017-06-03 NOTE — Anesthesia Preprocedure Evaluation (Signed)
Anesthesia Evaluation  Patient identified by MRN, date of birth, ID band Patient awake    Reviewed: Allergy & Precautions, NPO status , Patient's Chart, lab work & pertinent test results  Airway Mallampati: I  TM Distance: >3 FB Neck ROM: Full    Dental  (+) Teeth Intact   Pulmonary asthma , former smoker,    breath sounds clear to auscultation       Cardiovascular negative cardio ROS   Rhythm:Regular Rate:Normal     Neuro/Psych negative neurological ROS  negative psych ROS   GI/Hepatic Neg liver ROS, hematemsisi   Endo/Other  negative endocrine ROS  Renal/GU negative Renal ROS  negative genitourinary   Musculoskeletal negative musculoskeletal ROS (+)   Abdominal   Peds negative pediatric ROS (+)  Hematology negative hematology ROS (+)   Anesthesia Other Findings   Reproductive/Obstetrics negative OB ROS                             Anesthesia Physical Anesthesia Plan  ASA: I  Anesthesia Plan: MAC   Post-op Pain Management:    Induction: Intravenous  PONV Risk Score and Plan: 2 and Treatment may vary due to age or medical condition  Airway Management Planned: Natural Airway and Simple Face Mask  Additional Equipment:   Intra-op Plan:   Post-operative Plan:   Informed Consent: I have reviewed the patients History and Physical, chart, labs and discussed the procedure including the risks, benefits and alternatives for the proposed anesthesia with the patient or authorized representative who has indicated his/her understanding and acceptance.     Plan Discussed with: CRNA  Anesthesia Plan Comments:         Anesthesia Quick Evaluation

## 2017-06-03 NOTE — H&P (View-Only) (Signed)
Referring Provider: [facility]   Primary Care Physician:  Joretta Bachelor, PA Primary Gastroenterologist:  unassigned  Reason for Consultation:  hematemesis   Attending physician's note   I have taken a history, examined the patient and reviewed the chart. I agree with the Advanced Practitioner's note, impression and recommendations.  Several days of intermittent abdominal pain, N/V, hematemesis and a few black stools last week. She is 3 months post partum and relates a recent diagnosis of breast cancer. FOBT negative yesterday. CT shows stool throughout the colon otherwise negative. Hb=10.8.  R/O ulcer, gastritis, esophagitis.  PPI bid for now.  EGD today to further evaluate.   Lucio Edward, MD [facility] [phone] Mon-Fri 8a-5p [phone] after 5p, weekends, holidays   ASSESSMENT AND PLAN:   64. 21 yo female with several day hx of intermittent episodes of mid to upper abdominal pain associated with nausea, vomiting/hematemesis.  Also reports a couple of episodes of black stool last week .  BUN is normal. Hemoglobin 10.8, baseline seems to be 12-13 range.  She is 3 months postpartum.  CT scan is unremarkable. She could have erosive gastritis/peptic ulcer disease. -For further evaluation patient will be scheduled for an EGD to be done later today . The risks and benefits of EGD were discussed and the patient agrees to proceed.  -Continue twice daily PPI  2.  Recent diagnosis of mastitis treated with antibiotics.  Her abdominal pain, nausea vomiting started with initiation of antibiotics  3.  Recent diagnosis of breast cancer, per patient.  Apparently diagnosed in [place] while pregnant.  Says she was recently seen at [facility] in [place].  I am not able to locate.  that office visit in epic  HPI: Michaela Williams is a 21 y.o. female, 3 months post-partum admitted with hematemesis. She was recently started on antibiotics for mastitis and around the same time developed upper  abdominal pain, nausea and vomiting the upper abdominal pain often radiates through to the back.  Her symptoms are intermittent, often worse with food .  Last week she had an episode of hematemesis, bright red blood.  Since then she has had 3 or 4 recurrent episodes with blood being more dark in color.  Hgb 10.8, baseline difficult to discern be seems to be 12-13 range.  She has no chronic GI problems.  Patient says she does not take NSAIDs but they are listed on home med list. .  She has not had any diarrhea associated with the upper abdominal pain, nausea and vomiting but does report a couple of episodes of black stool last week.  Patient states she has been diagnosed with early stage breast cancer .  Details are vague but patient goes to school in [place] and found a lump in her breast while pregnant.  She was evaluated in [place] but biopsy not done because patient was pregnant at the time.  Sounds like she ultimately did get a biopsy after delivery of baby.  Told she had early breast cancer, chemotherapy recommended but patient did not want to start it . She forgot about everything  until noticing some enlargement of the lump .  Patient decided to establish care at the breast cancer here in [place].  She was due for a second visit yesterday but could not make it due to being in the hospital .    Past Medical History:  Diagnosis Date  . Anemia   . Asthma   . breast ca dx'd 05/2017   left  .  Depression    no meds in 3 yrs  . Headache   . Infection    UTI  . Seizures ([place]) 2014   "seizure like activity", reaction to medicaiton, none since    Past Surgical History:  Procedure Laterality Date  . NO PAST SURGERIES      Prior to Admission medications   Medication Sig Start Date End Date Taking? Authorizing Provider  clindamycin (CLEOCIN) 300 MG capsule Take 1 capsule (300 mg total) 3 (three) times daily by mouth. 05/21/17  Yes Rutherford Guys, MD  Etonogestrel [facility]) Inject 1  application into the skin. Currently implanted   Yes [provider]  FLUoxetine (PROZAC) 40 MG capsule Take 1 capsule (40 mg total) by mouth 2 (two) times daily. 03/17/17  Yes Denney, Rachelle A, CNM  ibuprofen (ADVIL,MOTRIN) 600 MG tablet Take 1 tablet (600 mg total) every 6 (six) hours as needed by mouth. 05/21/17  Yes Rutherford Guys, MD  Multiple Vitamins-Calcium (ONE-A-DAY WOMENS FORMULA) TABS Take 1 tablet by mouth daily.   Yes [provider]  Prenat-FeCbn-FeAspGl-FA-Omega (OB COMPLETE PETITE) 35-5-1-200 MG CAPS Take 1 tablet by mouth daily. 08/20/16  Yes Denney, Rachelle A, CNM  sertraline (ZOLOFT) 25 MG tablet Take 1 tablet (25 mg total) by mouth daily. 02/06/17  Yes Myrtis Ser, CNM  saccharomyces boulardii (FLORASTOR) 250 MG capsule Take 1 capsule (250 mg total) 2 (two) times daily by mouth. Patient not taking: Reported on 06/02/2017 05/21/17   Rutherford Guys, MD    Current Facility-Administered Medications  Medication Dose Route Frequency Provider Last Rate Last Dose  . 0.9 % NaCl with KCl 20 mEq/ L  infusion   Intravenous Continuous Thurnell Lose, MD 50 mL/hr at 06/02/17 2226    . lurasidone (LATUDA) tablet 40 mg  40 mg Oral Q breakfast Thurnell Lose, MD   40 mg at 06/03/17 0839  . morphine 2 MG/ML injection 2 mg  2 mg Intravenous Q4H PRN Gardiner Barefoot, NP   2 mg at 06/03/17 0024  . morphine 2 MG/ML injection           . ondansetron (ZOFRAN) injection 4 mg  4 mg Intravenous Q6H PRN Gardiner Barefoot, NP      . pantoprazole (PROTONIX) injection 40 mg  40 mg Intravenous Q12H Thurnell Lose, MD   40 mg at 06/02/17 2226  . polyethylene glycol (MIRALAX / GLYCOLAX) packet 17 g  17 g Oral Daily PRN Thurnell Lose, MD      . prenatal multivitamin tablet 1 tablet  1 tablet Oral Q1200 Thurnell Lose, MD      . saccharomyces boulardii (FLORASTOR) capsule 250 mg  250 mg Oral BID Thurnell Lose, MD   250 mg at 06/02/17 2226  . sertraline  (ZOLOFT) tablet 25 mg  25 mg Oral Daily Thurnell Lose, MD   25 mg at 06/02/17 2241  . traMADol (ULTRAM) tablet 50 mg  50 mg Oral Q6H PRN Gardiner Barefoot, NP        Allergies as of 06/02/2017 - Review Complete 06/02/2017  Allergen Reaction Noted  . Abilify [aripiprazole] Other (See Comments) 07/16/2016  . Lexapro [escitalopram] Other (See Comments) 07/16/2016  . Lithium  07/16/2016    Family Medical Hx. Patient tells me she is adopted and doesn't know [facility]    Social History   Socioeconomic History  . Marital status: Single    Spouse name: Not on file  .  Number of children: Not on file  . Years of education: Not on file  . Highest education level: Not on file  Social Needs  . Financial resource strain: Not on file  . Food insecurity - worry: Not on file  . Food insecurity - inability: Not on file  . Transportation needs - medical: Not on file  . Transportation needs - non-medical: Not on file  Occupational History  . Not on file  Tobacco Use  . Smoking status: Former Smoker    Types: Cigarettes    Last attempt to quit: 12/19/2014    Years since quitting: 2.4  . Smokeless tobacco: Never Used  . Tobacco comment: age 51  Substance and Sexual Activity  . Alcohol use: Yes    Comment: occasionally  . Drug use: No  . Sexual activity: Not Currently  Other Topics Concern  . Not on file  Social History Narrative  . Not on file    Review of Systems: All systems reviewed and negative except where noted in HPI.  Physical Exam: Vital signs in last 24 hours: Temp:  [98.2 F (36.8 C)-98.9 F (37.2 C)] 98.2 F (36.8 C) (11/28 0544) Pulse Rate:  [73-99] 73 (11/28 0544) Resp:  [13-18] 18 (11/28 0544) BP: (100-116)/(64-78) 100/64 (11/28 0544) SpO2:  [92 %-100 %] 99 % (11/28 0544) Weight:  [117 lb (53.1 kg)-120 lb 9.6 oz (54.7 kg)] 120 lb 9.6 oz (54.7 kg) (11/27 2057) Last BM Date: 05/31/17 General:   Alert, well-developed, thin, AA female in NAD Psych:  Pleasant,  cooperative. Normal mood and affect. Eyes:  Pupils equal, sclera clear, no icterus.   Conjunctiva pink. Ears:  Normal auditory acuity. Nose:  No deformity, discharge,  or lesions. Neck:  Supple; no masses Lungs:  Clear throughout to auscultation.   No wheezes, crackles, or rhonchi.  Heart:  Regular rate and rhythm; no murmurs, no edema Abdomen:  Soft, non-distended, diffuse tenderness (flinches with even light palpation). BS active,  no palp mass    Rectal:  Deferred  Msk:  Symmetrical without gross deformities. . Pulses:  Normal pulses noted. Neurologic:  Alert and  oriented x4;  grossly normal neurologically. Skin:  Intact without significant lesions or rashes..   Lab Results: Recent Labs    06/02/17 1019 06/03/17 0525  WBC 7.3 4.5  HGB 12.1 10.8*  HCT 36.7 33.3*  PLT 247 218   BMET Recent Labs    06/02/17 1019 06/03/17 0525  NA 139 138  K 3.7 3.9  CL 106 110  CO2 26 23  GLUCOSE 97 88  BUN 14 12  CREATININE 0.70 0.62  CALCIUM 9.6 8.9   LFT Recent Labs    06/03/17 0525  PROT 6.3*  ALBUMIN 3.6  AST 17  ALT 18  ALKPHOS 77  BILITOT 1.6*   PT/INR Recent Labs    06/02/17 2138  LABPROT 14.2  INR 1.11    Studies/Results: Ct Abdomen Pelvis W Contrast  Result Date: 06/02/2017 CLINICAL DATA:  Acute mid abdominal pain, hemoptysis. EXAM: CT ABDOMEN AND PELVIS WITH CONTRAST TECHNIQUE: Multidetector CT imaging of the abdomen and pelvis was performed using the standard protocol following bolus administration of intravenous contrast. CONTRAST:  144mL ISOVUE-300 IOPAMIDOL (ISOVUE-300) INJECTION 61% COMPARISON:  None. FINDINGS: Lower chest: No acute abnormality. Hepatobiliary: No focal liver abnormality is seen. No gallstones, gallbladder wall thickening, or biliary dilatation. Pancreas: Unremarkable. No pancreatic ductal dilatation or surrounding inflammatory changes. Spleen: Normal in size without focal abnormality. Adrenals/Urinary Tract: Adrenal glands are  unremarkable. Kidneys are normal, without renal calculi, focal lesion, or hydronephrosis. Bladder is unremarkable. Stomach/Bowel: Stomach is within normal limits. Appendix appears normal. No evidence of bowel wall thickening, distention, or inflammatory changes. Stool is noted throughout the colon. Vascular/Lymphatic: No significant vascular findings are present. No enlarged abdominal or pelvic lymph nodes. Reproductive: Uterus and bilateral adnexa are unremarkable. Other: No abdominal wall hernia or abnormality. No abdominopelvic ascites. Musculoskeletal: No acute or significant osseous findings. IMPRESSION: Stool is noted throughout the colon. No acute abnormality seen in the abdomen or pelvis. Electronically Signed   By: Marijo Conception, M.D.   On: 06/02/2017 14:56   Dg Abdomen Acute W/chest  Result Date: 06/02/2017 CLINICAL DATA:  Abdominal pain for 2 weeks with vomiting for 1 day. EXAM: DG ABDOMEN ACUTE W/ 1V CHEST COMPARISON:  05/25/2017 chest radiographs FINDINGS: Insert cardio the cardiomediastinal silhouette is unremarkable. The lungs are clear. No pleural effusion or pneumothorax noted. The bowel gas pattern is unremarkable. There is no evidence of dilated bowel loops, bowel obstruction, pneumoperitoneum or worrisome calcifications. The bony structures are unremarkable. IMPRESSION: Negative abdominal radiographs.  No acute cardiopulmonary disease. Electronically Signed   By: Margarette Canada M.D.   On: 06/02/2017 13:25    Tye Savoy, NP-C @  06/03/2017, 8:53 AM  Pager number [phone]

## 2017-06-03 NOTE — Anesthesia Postprocedure Evaluation (Signed)
Anesthesia Post Note  Patient: Michaela Williams  Procedure(s) Performed: ESOPHAGOGASTRODUODENOSCOPY (EGD) WITH PROPOFOL (N/A )     Patient location during evaluation: Endoscopy Anesthesia Type: MAC Level of consciousness: awake and alert Pain management: pain level controlled Vital Signs Assessment: post-procedure vital signs reviewed and stable Respiratory status: spontaneous breathing, nonlabored ventilation, respiratory function stable and patient connected to nasal cannula oxygen Cardiovascular status: stable and blood pressure returned to baseline Postop Assessment: no apparent nausea or vomiting Anesthetic complications: no    Last Vitals:  Vitals:   06/03/17 1200 06/03/17 1201  BP:    Pulse: 71 74  Resp: 14 15  Temp:    SpO2: 100% 100%    Last Pain:  Vitals:   06/03/17 1131  TempSrc: Oral  PainSc:                  Viola Placeres,JAMES TERRILL

## 2017-06-03 NOTE — Progress Notes (Signed)
[facility]  PROGRESS NOTE  Michaela Williams RKY:[id number] DOB: 13-Dec-1995 DOA: 06/02/2017 PCP: Joretta Bachelor, PA   Brief HPI:    21 y.o. female, depression, had a baby 3 months ago keeping good health since then, questionable recent diagnosis of left sided breast cancer in [place] a few days ago now resolved to see Dr. Payton Mccallum locally in the next few days, past month, who takes NSAIDs as needed at home comes in with one-week history of throwing bright red blood.  Patient states that she had an episode of emesis with bright red blood in it 1 week ago, she had 3-4 episodes with mild epigastric pain and nausea this subsided however today she had again 3 episodes of the same after which she came to the ER.       Subjective   Patient seen and examined, status post EGD, which showed gastritis.   Assessment/Plan:     1. Hematemesis- resolved, mild gastritis seen on EGD. Continue PPI BID, followed by once a day for 2 months.  2.  Recent diagnosis of left-sided breast cancer.  Evidently she felt a lump a few days ago and then had a biopsy done in [place], results are not available, she is getting the records transferred to local cancer center here for a second opinion with Dr. Payton Mccallum.  No acute issues will have her follow with oncology outpatient.  3.  History of depression/bipolar disorder.  Home medications continue.  Currently not suicidal homicidal.    2.     DVT prophylaxis: SCDs  Code Status: Full code   Family Communication: Family at bedside  Disposition Plan: likely home in am   Consultants:  GI  Procedures:  EGD  Continuous infusions . 0.9 % NaCl with KCl 20 mEq / L 50 mL/hr at 06/03/17 1800      Antibiotics:   Anti-infectives (From admission, onward)   None       Objective   Vitals:   06/03/17 1150 06/03/17 1200 06/03/17 1201 06/03/17 1220  BP: (!) 93/51   109/62  Pulse: 69 71 74 68  Resp: 14 14 15 18   Temp:    98.4 F (36.9  C)  TempSrc:    Oral  SpO2: 98% 100% 100% 100%  Weight:      Height:        Intake/Output Summary (Last 24 hours) at 06/03/2017 1901 Last data filed at 06/03/2017 1800 Gross per 24 hour  Intake 1350 ml  Output -  Net 1350 ml   Filed Weights   06/02/17 1020 06/02/17 2057  Weight: 53.1 kg (117 lb) 54.7 kg (120 lb 9.6 oz)     Physical Examination:   Physical Exam: Eyes: No icterus, extraocular muscles intact  Mouth: Oral mucosa is moist, no lesions on palate,  Neck: Supple, no deformities, masses, or tenderness Lungs: Normal respiratory effort, bilateral clear to auscultation, no crackles or wheezes.  Heart: Regular rate and rhythm, S1 and S2 normal, no murmurs, rubs auscultated Abdomen: BS normoactive,soft,nondistended,non-tender to palpation,no organomegaly Extremities: No pretibial edema, no erythema, no cyanosis, no clubbing Neuro : Alert and oriented to time, place and person, No focal deficits Skin: No rashes seen on exam    Data Reviewed: I have personally reviewed following labs and imaging studies  CBG: No results for input(s): GLUCAP in the last 168 hours.  CBC: Recent Labs  Lab 06/02/17 1019 06/03/17 0525  WBC 7.3 4.5  NEUTROABS 5.4  --   HGB 12.1 10.8*  HCT 36.7 33.3*  MCV 88.9 89.3  PLT 247 630    Basic Metabolic Panel: Recent Labs  Lab 06/02/17 1019 06/03/17 0525  NA 139 138  K 3.7 3.9  CL 106 110  CO2 26 23  GLUCOSE 97 88  BUN 14 12  CREATININE 0.70 0.62  CALCIUM 9.6 8.9    No results found for this or any previous visit (from the past 240 hour(s)).   Liver Function Tests: Recent Labs  Lab 06/02/17 1019 06/03/17 0525  AST 20 17  ALT 23 18  ALKPHOS 89 77  BILITOT 1.2 1.6*  PROT 7.7 6.3*  ALBUMIN 4.5 3.6   Recent Labs  Lab 06/02/17 1019  LIPASE 22   No results for input(s): AMMONIA in the last 168 hours.  Cardiac Enzymes: No results for input(s): CKTOTAL, CKMB, CKMBINDEX, TROPONINI in the last 168 hours. BNP (last  3 results) No results for input(s): BNP in the last 8760 hours.  ProBNP (last 3 results) No results for input(s): PROBNP in the last 8760 hours.    Studies: Ct Abdomen Pelvis W Contrast  Result Date: 06/02/2017 CLINICAL DATA:  Acute mid abdominal pain, hemoptysis. EXAM: CT ABDOMEN AND PELVIS WITH CONTRAST TECHNIQUE: Multidetector CT imaging of the abdomen and pelvis was performed using the standard protocol following bolus administration of intravenous contrast. CONTRAST:  158mL ISOVUE-300 IOPAMIDOL (ISOVUE-300) INJECTION 61% COMPARISON:  None. FINDINGS: Lower chest: No acute abnormality. Hepatobiliary: No focal liver abnormality is seen. No gallstones, gallbladder wall thickening, or biliary dilatation. Pancreas: Unremarkable. No pancreatic ductal dilatation or surrounding inflammatory changes. Spleen: Normal in size without focal abnormality. Adrenals/Urinary Tract: Adrenal glands are unremarkable. Kidneys are normal, without renal calculi, focal lesion, or hydronephrosis. Bladder is unremarkable. Stomach/Bowel: Stomach is within normal limits. Appendix appears normal. No evidence of bowel wall thickening, distention, or inflammatory changes. Stool is noted throughout the colon. Vascular/Lymphatic: No significant vascular findings are present. No enlarged abdominal or pelvic lymph nodes. Reproductive: Uterus and bilateral adnexa are unremarkable. Other: No abdominal wall hernia or abnormality. No abdominopelvic ascites. Musculoskeletal: No acute or significant osseous findings. IMPRESSION: Stool is noted throughout the colon. No acute abnormality seen in the abdomen or pelvis. Electronically Signed   By: Marijo Conception, M.D.   On: 06/02/2017 14:56   Dg Abdomen Acute W/chest  Result Date: 06/02/2017 CLINICAL DATA:  Abdominal pain for 2 weeks with vomiting for 1 day. EXAM: DG ABDOMEN ACUTE W/ 1V CHEST COMPARISON:  05/25/2017 chest radiographs FINDINGS: Insert cardio the cardiomediastinal silhouette  is unremarkable. The lungs are clear. No pleural effusion or pneumothorax noted. The bowel gas pattern is unremarkable. There is no evidence of dilated bowel loops, bowel obstruction, pneumoperitoneum or worrisome calcifications. The bony structures are unremarkable. IMPRESSION: Negative abdominal radiographs.  No acute cardiopulmonary disease. Electronically Signed   By: Margarette Canada M.D.   On: 06/02/2017 13:25    Scheduled Meds: . lurasidone  40 mg Oral Q breakfast  . pantoprazole  40 mg Oral BID AC  . polyethylene glycol  17 g Oral Daily  . prenatal multivitamin  1 tablet Oral Q1200  . saccharomyces boulardii  250 mg Oral BID  . sertraline  25 mg Oral Daily      Time spent: 25 min   [facility] Pager [phone]. If 7PM-7AM, please contact night-coverage at [email or web address], Office  [phone]  password [place]  06/03/2017, 7:01 PM  LOS: 0 days

## 2017-06-03 NOTE — Consult Note (Signed)
Referring Provider: [facility]   Primary Care Physician:  Joretta Bachelor, PA Primary Gastroenterologist:  unassigned  Reason for Consultation:  hematemesis   Attending physician's note   I have taken a history, examined the patient and reviewed the chart. I agree with the Advanced Practitioner's note, impression and recommendations.  Several days of intermittent abdominal pain, N/V, hematemesis and a few black stools last week. She is 3 months post partum and relates a recent diagnosis of breast cancer. FOBT negative yesterday. CT shows stool throughout the colon otherwise negative. Hb=10.8.  R/O ulcer, gastritis, esophagitis.  PPI bid for now.  EGD today to further evaluate.   Lucio Edward, MD Marval Regal [phone] Mon-Fri 8a-5p [phone] after 5p, weekends, holidays   ASSESSMENT AND PLAN:   21. 22 yo female with several day hx of intermittent episodes of mid to upper abdominal pain associated with nausea, vomiting/hematemesis.  Also reports a couple of episodes of black stool last week .  BUN is normal. Hemoglobin 10.8, baseline seems to be 12-13 range.  She is 3 months postpartum.  CT scan is unremarkable. She could have erosive gastritis/peptic ulcer disease. -For further evaluation patient will be scheduled for an EGD to be done later today . The risks and benefits of EGD were discussed and the patient agrees to proceed.  -Continue twice daily PPI  2.  Recent diagnosis of mastitis treated with antibiotics.  Her abdominal pain, nausea vomiting started with initiation of antibiotics  3.  Recent diagnosis of breast cancer, per patient.  Apparently diagnosed in [place] while pregnant.  Says she was recently seen at [facility] in [place].  I am not able to locate.  that office visit in epic  HPI: Michaela Williams is a 21 y.o. female, 3 months post-partum admitted with hematemesis. She was recently started on antibiotics for mastitis and around the same time developed upper  abdominal pain, nausea and vomiting the upper abdominal pain often radiates through to the back.  Her symptoms are intermittent, often worse with food .  Last week she had an episode of hematemesis, bright red blood.  Since then she has had 3 or 4 recurrent episodes with blood being more dark in color.  Hgb 10.8, baseline difficult to discern be seems to be 12-13 range.  She has no chronic GI problems.  Patient says she does not take NSAIDs but they are listed on home med list. .  She has not had any diarrhea associated with the upper abdominal pain, nausea and vomiting but does report a couple of episodes of black stool last week.  Patient states she has been diagnosed with early stage breast cancer .  Details are vague but patient goes to school in [place] and found a lump in her breast while pregnant.  She was evaluated in [place] but biopsy not done because patient was pregnant at the time.  Sounds like she ultimately did get a biopsy after delivery of baby.  Told she had early breast cancer, chemotherapy recommended but patient did not want to start it . She forgot about everything  until noticing some enlargement of the lump .  Patient decided to establish care at the breast cancer here in [place].  She was due for a second visit yesterday but could not make it due to being in the hospital .    Past Medical History:  Diagnosis Date  . Anemia   . Asthma   . breast ca dx'd 05/2017   left  .  Depression    no meds in 3 yrs  . Headache   . Infection    UTI  . Seizures ([place]) 2014   "seizure like activity", reaction to medicaiton, none since    Past Surgical History:  Procedure Laterality Date  . NO PAST SURGERIES      Prior to Admission medications   Medication Sig Start Date End Date Taking? Authorizing Provider  clindamycin (CLEOCIN) 300 MG capsule Take 1 capsule (300 mg total) 3 (three) times daily by mouth. 05/21/17  Yes Rutherford Guys, MD  Etonogestrel [facility]) Inject 1  application into the skin. Currently implanted   Yes [provider]  FLUoxetine (PROZAC) 40 MG capsule Take 1 capsule (40 mg total) by mouth 2 (two) times daily. 03/17/17  Yes Denney, Rachelle A, CNM  ibuprofen (ADVIL,MOTRIN) 600 MG tablet Take 1 tablet (600 mg total) every 6 (six) hours as needed by mouth. 05/21/17  Yes Rutherford Guys, MD  Multiple Vitamins-Calcium (ONE-A-DAY WOMENS FORMULA) TABS Take 1 tablet by mouth daily.   Yes [provider]  Prenat-FeCbn-FeAspGl-FA-Omega (OB COMPLETE PETITE) 35-5-1-200 MG CAPS Take 1 tablet by mouth daily. 08/20/16  Yes Denney, Rachelle A, CNM  sertraline (ZOLOFT) 25 MG tablet Take 1 tablet (25 mg total) by mouth daily. 02/06/17  Yes Myrtis Ser, CNM  saccharomyces boulardii (FLORASTOR) 250 MG capsule Take 1 capsule (250 mg total) 2 (two) times daily by mouth. Patient not taking: Reported on 06/02/2017 05/21/17   Rutherford Guys, MD    Current Facility-Administered Medications  Medication Dose Route Frequency Provider Last Rate Last Dose  . 0.9 % NaCl with KCl 20 mEq/ L  infusion   Intravenous Continuous Thurnell Lose, MD 50 mL/hr at 06/02/17 2226    . lurasidone (LATUDA) tablet 40 mg  40 mg Oral Q breakfast Thurnell Lose, MD   40 mg at 06/03/17 0839  . morphine 2 MG/ML injection 2 mg  2 mg Intravenous Q4H PRN Gardiner Barefoot, NP   2 mg at 06/03/17 0024  . morphine 2 MG/ML injection           . ondansetron (ZOFRAN) injection 4 mg  4 mg Intravenous Q6H PRN Gardiner Barefoot, NP      . pantoprazole (PROTONIX) injection 40 mg  40 mg Intravenous Q12H Thurnell Lose, MD   40 mg at 06/02/17 2226  . polyethylene glycol (MIRALAX / GLYCOLAX) packet 17 g  17 g Oral Daily PRN Thurnell Lose, MD      . prenatal multivitamin tablet 1 tablet  1 tablet Oral Q1200 Thurnell Lose, MD      . saccharomyces boulardii (FLORASTOR) capsule 250 mg  250 mg Oral BID Thurnell Lose, MD   250 mg at 06/02/17 2226  . sertraline  (ZOLOFT) tablet 25 mg  25 mg Oral Daily Thurnell Lose, MD   25 mg at 06/02/17 2241  . traMADol (ULTRAM) tablet 50 mg  50 mg Oral Q6H PRN Gardiner Barefoot, NP        Allergies as of 06/02/2017 - Review Complete 06/02/2017  Allergen Reaction Noted  . Abilify [aripiprazole] Other (See Comments) 07/16/2016  . Lexapro [escitalopram] Other (See Comments) 07/16/2016  . Lithium  07/16/2016    Family Medical Hx. Patient tells me she is adopted and doesn't know [facility]    Social History   Socioeconomic History  . Marital status: Single    Spouse name: Not on file  .  Number of children: Not on file  . Years of education: Not on file  . Highest education level: Not on file  Social Needs  . Financial resource strain: Not on file  . Food insecurity - worry: Not on file  . Food insecurity - inability: Not on file  . Transportation needs - medical: Not on file  . Transportation needs - non-medical: Not on file  Occupational History  . Not on file  Tobacco Use  . Smoking status: Former Smoker    Types: Cigarettes    Last attempt to quit: 12/19/2014    Years since quitting: 2.4  . Smokeless tobacco: Never Used  . Tobacco comment: age 29  Substance and Sexual Activity  . Alcohol use: Yes    Comment: occasionally  . Drug use: No  . Sexual activity: Not Currently  Other Topics Concern  . Not on file  Social History Narrative  . Not on file    Review of Systems: All systems reviewed and negative except where noted in HPI.  Physical Exam: Vital signs in last 24 hours: Temp:  [98.2 F (36.8 C)-98.9 F (37.2 C)] 98.2 F (36.8 C) (11/28 0544) Pulse Rate:  [73-99] 73 (11/28 0544) Resp:  [13-18] 18 (11/28 0544) BP: (100-116)/(64-78) 100/64 (11/28 0544) SpO2:  [92 %-100 %] 99 % (11/28 0544) Weight:  [117 lb (53.1 kg)-120 lb 9.6 oz (54.7 kg)] 120 lb 9.6 oz (54.7 kg) (11/27 2057) Last BM Date: 05/31/17 General:   Alert, well-developed, thin, AA female in NAD Psych:  Pleasant,  cooperative. Normal mood and affect. Eyes:  Pupils equal, sclera clear, no icterus.   Conjunctiva pink. Ears:  Normal auditory acuity. Nose:  No deformity, discharge,  or lesions. Neck:  Supple; no masses Lungs:  Clear throughout to auscultation.   No wheezes, crackles, or rhonchi.  Heart:  Regular rate and rhythm; no murmurs, no edema Abdomen:  Soft, non-distended, diffuse tenderness (flinches with even light palpation). BS active,  no palp mass    Rectal:  Deferred  Msk:  Symmetrical without gross deformities. . Pulses:  Normal pulses noted. Neurologic:  Alert and  oriented x4;  grossly normal neurologically. Skin:  Intact without significant lesions or rashes..   Lab Results: Recent Labs    06/02/17 1019 06/03/17 0525  WBC 7.3 4.5  HGB 12.1 10.8*  HCT 36.7 33.3*  PLT 247 218   BMET Recent Labs    06/02/17 1019 06/03/17 0525  NA 139 138  K 3.7 3.9  CL 106 110  CO2 26 23  GLUCOSE 97 88  BUN 14 12  CREATININE 0.70 0.62  CALCIUM 9.6 8.9   LFT Recent Labs    06/03/17 0525  PROT 6.3*  ALBUMIN 3.6  AST 17  ALT 18  ALKPHOS 77  BILITOT 1.6*   PT/INR Recent Labs    06/02/17 2138  LABPROT 14.2  INR 1.11    Studies/Results: Ct Abdomen Pelvis W Contrast  Result Date: 06/02/2017 CLINICAL DATA:  Acute mid abdominal pain, hemoptysis. EXAM: CT ABDOMEN AND PELVIS WITH CONTRAST TECHNIQUE: Multidetector CT imaging of the abdomen and pelvis was performed using the standard protocol following bolus administration of intravenous contrast. CONTRAST:  112mL ISOVUE-300 IOPAMIDOL (ISOVUE-300) INJECTION 61% COMPARISON:  None. FINDINGS: Lower chest: No acute abnormality. Hepatobiliary: No focal liver abnormality is seen. No gallstones, gallbladder wall thickening, or biliary dilatation. Pancreas: Unremarkable. No pancreatic ductal dilatation or surrounding inflammatory changes. Spleen: Normal in size without focal abnormality. Adrenals/Urinary Tract: Adrenal glands are  unremarkable. Kidneys are normal, without renal calculi, focal lesion, or hydronephrosis. Bladder is unremarkable. Stomach/Bowel: Stomach is within normal limits. Appendix appears normal. No evidence of bowel wall thickening, distention, or inflammatory changes. Stool is noted throughout the colon. Vascular/Lymphatic: No significant vascular findings are present. No enlarged abdominal or pelvic lymph nodes. Reproductive: Uterus and bilateral adnexa are unremarkable. Other: No abdominal wall hernia or abnormality. No abdominopelvic ascites. Musculoskeletal: No acute or significant osseous findings. IMPRESSION: Stool is noted throughout the colon. No acute abnormality seen in the abdomen or pelvis. Electronically Signed   By: Marijo Conception, M.D.   On: 06/02/2017 14:56   Dg Abdomen Acute W/chest  Result Date: 06/02/2017 CLINICAL DATA:  Abdominal pain for 2 weeks with vomiting for 1 day. EXAM: DG ABDOMEN ACUTE W/ 1V CHEST COMPARISON:  05/25/2017 chest radiographs FINDINGS: Insert cardio the cardiomediastinal silhouette is unremarkable. The lungs are clear. No pleural effusion or pneumothorax noted. The bowel gas pattern is unremarkable. There is no evidence of dilated bowel loops, bowel obstruction, pneumoperitoneum or worrisome calcifications. The bony structures are unremarkable. IMPRESSION: Negative abdominal radiographs.  No acute cardiopulmonary disease. Electronically Signed   By: Margarette Canada M.D.   On: 06/02/2017 13:25    Tye Savoy, NP-C @  06/03/2017, 8:53 AM  Pager number [phone]

## 2017-06-03 NOTE — Transfer of Care (Signed)
Immediate Anesthesia Transfer of Care Note  Patient: Michaela Williams  Procedure(s) Performed: ESOPHAGOGASTRODUODENOSCOPY (EGD) WITH PROPOFOL (N/A )  Patient Location: PACU  Anesthesia Type:MAC  Level of Consciousness: awake, alert , oriented and patient cooperative  Airway & Oxygen Therapy: Patient Spontanous Breathing and Patient connected to nasal cannula oxygen  Post-op Assessment: Report given to RN, Post -op Vital signs reviewed and stable and Patient moving all extremities  Post vital signs: Reviewed and stable  Last Vitals:  Vitals:   06/03/17 1030 06/03/17 1131  BP: 92/60 (!) 95/57  Pulse: 85 93  Resp: 15 20  Temp: 36.8 C 36.5 C  SpO2: 99% 100%    Last Pain:  Vitals:   06/03/17 1131  TempSrc: Oral  PainSc:       Patients Stated Pain Goal: 0 (49/70/26 3785)  Complications: No apparent anesthesia complications

## 2017-06-03 NOTE — Op Note (Addendum)
[facility] Patient Name: Michaela Williams Procedure Date: 06/03/2017 MRN: [id number] Attending MD: Ladene Artist , MD Date of Birth: 1995/09/16 CSN: [id number] Age: 21 Admit Type: Outpatient Procedure:                Upper GI endoscopy Indications:              Generalized abdominal pain, Hematemesis, Nausea                            with vomiting Providers:                Pricilla Riffle. Fuller Plan, MD, Jobe Igo, RN, Alan Mulder, Technician Referring MD:             [facility] Medicines:                Monitored Anesthesia Care Complications:            No immediate complications. Estimated Blood Loss:     Estimated blood loss was minimal. Procedure:                Pre-Anesthesia Assessment:                           - Prior to the procedure, a History and Physical                            was performed, and patient medications and                            allergies were reviewed. The patient's tolerance of                            previous anesthesia was also reviewed. The risks                            and benefits of the procedure and the sedation                            options and risks were discussed with the patient.                            All questions were answered, and informed consent                            was obtained. Prior Anticoagulants: The patient has                            taken no previous anticoagulant or antiplatelet                            agents. ASA Grade Assessment: II - A patient with  mild systemic disease. After reviewing the risks                            and benefits, the patient was deemed in                            satisfactory condition to undergo the procedure.                           After obtaining informed consent, the endoscope was                            passed under direct vision. Throughout the   procedure, the patient's blood pressure, pulse, and                            oxygen saturations were monitored continuously. The                            Endoscope was introduced through the mouth, and                            advanced to the second part of duodenum. The upper                            GI endoscopy was accomplished without difficulty.                            The patient tolerated the procedure well. Scope In: Scope Out: Findings:      The examined esophagus was normal.      Localized mild inflammation characterized by erosions and erythema was       found in the gastric body. Biopsies were taken with a cold forceps for       histology.      The exam of the stomach was otherwise normal.      The duodenal bulb and second portion of the duodenum were normal. Impression:               - Normal esophagus.                           - Mild gastritis. Biopsied.                           - Normal duodenal bulb and second portion of the                            duodenum. Moderate Sedation:      N/A- Per Anesthesia Care Recommendation:           - Minimal findings on EGD are not sufficient to                            explain her symptoms.                           -  Full liquid diet today, advance as tolerated.                           - Continue present medications including PPI po bid                            for 1 week and then PPI po qd for 2 months.                           - Await pathology results.                           - Miralax daily for constipation.                           - Levsin 1-2 po tid.                           - Minimize or avoid ASA/NSAIDs Procedure Code(s):        --- Professional ---                           [phone], Esophagogastroduodenoscopy, flexible,                            transoral; with biopsy, single or multiple Diagnosis Code(s):        --- Professional ---                           K29.70, Gastritis, unspecified,  without bleeding                           R10.84, Generalized abdominal pain                           K92.0, Hematemesis                           R11.2, Nausea with vomiting, unspecified CPT copyright 2016 [employer]. All rights reserved. The codes documented in this report are preliminary and upon coder review may  be revised to meet current compliance requirements. Ladene Artist, MD 06/03/2017 11:32:30 AM This report has been signed electronically. Number of Addenda: 0

## 2017-06-03 NOTE — Interval H&P Note (Signed)
History and Physical Interval Note:  06/03/2017 11:16 AM  Michaela Williams  has presented today for surgery, with the diagnosis of Nausea, vomting, hematemesis, upper abdominal pain  The various methods of treatment have been discussed with the patient and family. After consideration of risks, benefits and other options for treatment, the patient has consented to  Procedure(s): ESOPHAGOGASTRODUODENOSCOPY (EGD) WITH PROPOFOL (N/A) as a surgical intervention .  The patient's history has been reviewed, patient examined, no change in status, stable for surgery.  I have reviewed the patient's chart and labs.  Questions were answered to the patient's satisfaction.     Pricilla Riffle. Fuller Plan

## 2017-06-04 ENCOUNTER — Encounter: Payer: Self-pay | Admitting: Gastroenterology

## 2017-06-04 ENCOUNTER — Ambulatory Visit: Payer: 59 | Admitting: Family Medicine

## 2017-06-04 ENCOUNTER — Encounter (HOSPITAL_COMMUNITY): Payer: Self-pay | Admitting: Gastroenterology

## 2017-06-04 DIAGNOSIS — K920 Hematemesis: Secondary | ICD-10-CM | POA: Diagnosis not present

## 2017-06-04 DIAGNOSIS — K922 Gastrointestinal hemorrhage, unspecified: Secondary | ICD-10-CM | POA: Diagnosis not present

## 2017-06-04 LAB — CBC
HEMATOCRIT: 32.3 % — AB (ref 36.0–46.0)
HEMOGLOBIN: 10.6 g/dL — AB (ref 12.0–15.0)
MCH: 29.4 pg (ref 26.0–34.0)
MCHC: 32.8 g/dL (ref 30.0–36.0)
MCV: 89.5 fL (ref 78.0–100.0)
Platelets: 216 10*3/uL (ref 150–400)
RBC: 3.61 MIL/uL — AB (ref 3.87–5.11)
RDW: 14.6 % (ref 11.5–15.5)
WBC: 5.6 10*3/uL (ref 4.0–10.5)

## 2017-06-04 MED ORDER — PANTOPRAZOLE SODIUM 40 MG PO TBEC: 40.0000 mg | DELAYED_RELEASE_TABLET | Freq: Two times a day (BID) | ORAL | 1 refills | Status: DC

## 2017-06-04 NOTE — Progress Notes (Signed)
06/04/17  1245  Reviewed discharge instructions with patient. Patient verbalized understanding of discharge instructions. Copy of discharge instructions and prescription given to patient.

## 2017-06-04 NOTE — Discharge Summary (Signed)
Physician Discharge Summary  Michaela Williams GLO:[id number] DOB: [birth year] DOA: 06/02/2017  PCP: Ivar Drape D, PA  Admit date: 06/02/2017 Discharge date: 06/04/2017  Time spent: *35 minutes  Recommendations for Outpatient Follow-up:  1. Follow up PCP in 2 weeks   Discharge Diagnoses:  Principal Problem:   UGI bleed Active Problems:   Childhood asthma   Breast cancer (HCC)   Hematemesis   Generalized abdominal pain   Non-intractable vomiting with nausea   Discharge Condition: *Stable  Diet recommendation: regular diet  Filed Weights   06/02/17 1020 06/02/17 2057  Weight: 53.1 kg (117 lb) 54.7 kg (120 lb 9.6 oz)    History of present illness:  21 y.o.female,depression, had a baby 3 months ago keeping good health since then, questionable recent diagnosis ofleft sidedbreast cancer in [place] a few days ago now resolved to see Dr. Payton Mccallum locally in the next few days, past month, who takes NSAIDs as needed at home comes in with one-week history of throwing bright red blood. Patient states that she had an episode of emesis with bright red blood in it 1 week ago, she had 3-4 episodes with mild epigastric pain and nausea this subsided however today she had again 3 episodes of the same after which she came to the ER    Hospital Course:  1. Hematemesis- resolved, mild gastritis seen on EGD. Continue PPI BID for one week, followed by once a day for 2 months.  2.Recent diagnosis of left-sided breast cancer. Evidently she felt a lump a few days ago and then had a biopsy done in [place], results are not available, she is getting the records transferred to local cancer center here for a second opinion with Dr. Payton Mccallum. No acute issues will have her follow with oncology outpatient.  3.History of depression/bipolar disorder. Home medications continue. Currently not suicidal homicidal.    Procedures:  EGD  Consultations:  GI  Discharge Exam: Vitals:    06/03/17 2126 06/04/17 0608  BP: 104/66 107/72  Pulse: 90 74  Resp: 18 18  Temp: 99.1 F (37.3 C) 99 F (37.2 C)  SpO2: 99% 100%    General: Appears in no acute distress Cardiovascular: S1S2, RRR Respiratory: Clear bilaterally  Discharge Instructions   Discharge Instructions    Diet - low sodium heart healthy   Complete by:  As directed    Increase activity slowly   Complete by:  As directed      Allergies as of 06/04/2017      Reactions   Abilify [aripiprazole] Other (See Comments)   Did not like how it made her feel   Lexapro [escitalopram] Other (See Comments)   Didn't like how it made her feel   Lithium    SEIZURES, Childhood reaction      Medication List    STOP taking these medications   clindamycin 300 MG capsule Commonly known as:  CLEOCIN   ibuprofen 600 MG tablet Commonly known as:  ADVIL,MOTRIN     TAKE these medications   FLUoxetine 40 MG capsule Commonly known as:  PROZAC Take 1 capsule (40 mg total) by mouth 2 (two) times daily.   NEXPLANON [place] Inject 1 application into the skin. Currently implanted   OB COMPLETE PETITE 35-5-1-200 MG Caps Take 1 tablet by mouth daily.   ONE-A-DAY WOMENS FORMULA Tabs Take 1 tablet by mouth daily.   pantoprazole 40 MG tablet Commonly known as:  PROTONIX Take 1 tablet (40 mg total) by mouth 2 (two) times daily before  a meal. Take Protonix 40 mg po twice a day for one week, then once daily for 2 months.   saccharomyces boulardii 250 MG capsule Commonly known as:  FLORASTOR Take 1 capsule (250 mg total) 2 (two) times daily by mouth.   sertraline 25 MG tablet Commonly known as:  ZOLOFT Take 1 tablet (25 mg total) by mouth daily.      Allergies  Allergen Reactions  . Abilify [Aripiprazole] Other (See Comments)    Did not like how it made her feel  . Lexapro [Escitalopram] Other (See Comments)    Didn't like how it made her feel  . Lithium     SEIZURES, Childhood reaction      The results of  significant diagnostics from this hospitalization (including imaging, microbiology, ancillary and laboratory) are listed below for reference.    Significant Diagnostic Studies: Dg Chest 2 View  Result Date: 05/25/2017 CLINICAL DATA:  Initial evaluation for acute cough. EXAM: CHEST  2 VIEW COMPARISON:  None. FINDINGS: The cardiac and mediastinal silhouettes are within normal limits. The lungs are normally inflated. Mild hazy and patchy left basilar opacity, which may reflect atelectasis or infiltrate. No other focal airspace disease. No pulmonary edema or pleural effusion. No pneumothorax. No acute osseous abnormality identified. IMPRESSION: 1. Mild patchy and hazy left basilar opacity, which may reflect atelectasis or infiltrate. 2. No other active cardiopulmonary disease. Electronically Signed   By: Jeannine Boga M.D.   On: 05/25/2017 06:24   Ct Abdomen Pelvis W Contrast  Result Date: 06/02/2017 CLINICAL DATA:  Acute mid abdominal pain, hemoptysis. EXAM: CT ABDOMEN AND PELVIS WITH CONTRAST TECHNIQUE: Multidetector CT imaging of the abdomen and pelvis was performed using the standard protocol following bolus administration of intravenous contrast. CONTRAST:  176mL ISOVUE-300 IOPAMIDOL (ISOVUE-300) INJECTION 61% COMPARISON:  None. FINDINGS: Lower chest: No acute abnormality. Hepatobiliary: No focal liver abnormality is seen. No gallstones, gallbladder wall thickening, or biliary dilatation. Pancreas: Unremarkable. No pancreatic ductal dilatation or surrounding inflammatory changes. Spleen: Normal in size without focal abnormality. Adrenals/Urinary Tract: Adrenal glands are unremarkable. Kidneys are normal, without renal calculi, focal lesion, or hydronephrosis. Bladder is unremarkable. Stomach/Bowel: Stomach is within normal limits. Appendix appears normal. No evidence of bowel wall thickening, distention, or inflammatory changes. Stool is noted throughout the colon. Vascular/Lymphatic: No  significant vascular findings are present. No enlarged abdominal or pelvic lymph nodes. Reproductive: Uterus and bilateral adnexa are unremarkable. Other: No abdominal wall hernia or abnormality. No abdominopelvic ascites. Musculoskeletal: No acute or significant osseous findings. IMPRESSION: Stool is noted throughout the colon. No acute abnormality seen in the abdomen or pelvis. Electronically Signed   By: Marijo Conception, M.D.   On: 06/02/2017 14:56   Dg Abdomen Acute W/chest  Result Date: 06/02/2017 CLINICAL DATA:  Abdominal pain for 2 weeks with vomiting for 1 day. EXAM: DG ABDOMEN ACUTE W/ 1V CHEST COMPARISON:  05/25/2017 chest radiographs FINDINGS: Insert cardio the cardiomediastinal silhouette is unremarkable. The lungs are clear. No pleural effusion or pneumothorax noted. The bowel gas pattern is unremarkable. There is no evidence of dilated bowel loops, bowel obstruction, pneumoperitoneum or worrisome calcifications. The bony structures are unremarkable. IMPRESSION: Negative abdominal radiographs.  No acute cardiopulmonary disease. Electronically Signed   By: Margarette Canada M.D.   On: 06/02/2017 13:25   US Breast Ltd Uni Left Inc Axilla  Result Date: 05/25/2017 CLINICAL DATA:  Clinical history of left breast mastitis not responding to oral antibiotics. The patient describes an area of palpable concern in  her lower outer left breast. Patient is currently breast-feeding her 70-month-old infant. EXAM: ULTRASOUND OF THE LEFT BREAST COMPARISON:  Previous exam(s). FINDINGS: On physical exam, the left breast is engorged, asymmetric to the right. Mild redness of the inferior breast is noted. Targeted ultrasound is performed, showing no suspicious masses or shadowing lesions. Prominent milk ducts and dense breast parenchyma, expected changes of lactating breast. No drainable fluid collections. No skin thickening. IMPRESSION: Mild redness and engorgement of the left breast, without evidence of sonographically  apparent masses or drainable fluid collections. In a breastfeeding patient, these findings likely represent changes of mastitis. The patient has bean prescribed a new oral antibiotic by her physician, which she will start today. RECOMMENDATION: Ultrasound follow-up of patient's left breast in 1 week. I have discussed the findings and recommendations with the patient. Results were also provided in writing at the conclusion of the visit. If applicable, a reminder letter will be sent to the patient regarding the next appointment. BI-RADS CATEGORY  2: Benign. Electronically Signed   By: Fidela Salisbury M.D.   On: 05/25/2017 13:17    Microbiology: No results found for this or any previous visit (from the past 240 hour(s)).   Labs: Basic Metabolic Panel: Recent Labs  Lab 06/02/17 1019 06/03/17 0525  NA 139 138  K 3.7 3.9  CL 106 110  CO2 26 23  GLUCOSE 97 88  BUN 14 12  CREATININE 0.70 0.62  CALCIUM 9.6 8.9   Liver Function Tests: Recent Labs  Lab 06/02/17 1019 06/03/17 0525  AST 20 17  ALT 23 18  ALKPHOS 89 77  BILITOT 1.2 1.6*  PROT 7.7 6.3*  ALBUMIN 4.5 3.6   Recent Labs  Lab 06/02/17 1019  LIPASE 22   No results for input(s): AMMONIA in the last 168 hours. CBC: Recent Labs  Lab 06/02/17 1019 06/03/17 0525 06/04/17 0439  WBC 7.3 4.5 5.6  NEUTROABS 5.4  --   --   HGB 12.1 10.8* 10.6*  HCT 36.7 33.3* 32.3*  MCV 88.9 89.3 89.5  PLT 247 218 216       Signed:  Oswald Hillock MD.  [facility] 06/04/2017, 12:09 PM

## 2017-06-04 NOTE — Plan of Care (Signed)
  Activity: Risk for activity intolerance will decrease 06/04/2017 0848 - Progressing by Dorene Sorrow, RN   Nutrition: Adequate nutrition will be maintained 06/04/2017 0848 - Progressing by Dorene Sorrow, RN   Coping: Level of anxiety will decrease 06/04/2017 0848 - Progressing by Dorene Sorrow, RN

## 2017-06-10 ENCOUNTER — Ambulatory Visit
Admission: RE | Admit: 2017-06-10 | Discharge: 2017-06-10 | Disposition: A | Discharge status: Home / Self Care | Payer: 59 | Source: Ambulatory Visit | Attending: Family Medicine | Admitting: Family Medicine

## 2017-06-10 DIAGNOSIS — N61 Mastitis without abscess: Secondary | ICD-10-CM

## 2017-06-17 ENCOUNTER — Inpatient Hospital Stay: Payer: 59 | Admitting: Physician Assistant

## 2017-07-01 ENCOUNTER — Other Ambulatory Visit: Payer: Self-pay

## 2017-07-01 ENCOUNTER — Inpatient Hospital Stay (HOSPITAL_COMMUNITY)
Admission: RE | Admit: 2017-07-01 | Discharge: 2017-07-05 | DRG: 885 | Disposition: A | Discharge status: Home / Self Care | Payer: 59 | Attending: Psychiatry | Admitting: Psychiatry

## 2017-07-01 ENCOUNTER — Encounter (HOSPITAL_COMMUNITY): Payer: Self-pay

## 2017-07-01 DIAGNOSIS — Z87891 Personal history of nicotine dependence: Secondary | ICD-10-CM

## 2017-07-01 DIAGNOSIS — F419 Anxiety disorder, unspecified: Secondary | ICD-10-CM | POA: Diagnosis present

## 2017-07-01 DIAGNOSIS — G4700 Insomnia, unspecified: Secondary | ICD-10-CM | POA: Diagnosis present

## 2017-07-01 DIAGNOSIS — Z818 Family history of other mental and behavioral disorders: Secondary | ICD-10-CM | POA: Diagnosis not present

## 2017-07-01 DIAGNOSIS — J45909 Unspecified asthma, uncomplicated: Secondary | ICD-10-CM | POA: Diagnosis present

## 2017-07-01 DIAGNOSIS — Z888 Allergy status to other drugs, medicaments and biological substances status: Secondary | ICD-10-CM | POA: Diagnosis not present

## 2017-07-01 DIAGNOSIS — T43226A Underdosing of selective serotonin reuptake inhibitors, initial encounter: Secondary | ICD-10-CM | POA: Diagnosis present

## 2017-07-01 DIAGNOSIS — Z853 Personal history of malignant neoplasm of breast: Secondary | ICD-10-CM

## 2017-07-01 DIAGNOSIS — R450 Nervousness: Secondary | ICD-10-CM | POA: Diagnosis not present

## 2017-07-01 DIAGNOSIS — X58XXXA Exposure to other specified factors, initial encounter: Secondary | ICD-10-CM | POA: Diagnosis present

## 2017-07-01 DIAGNOSIS — Z79899 Other long term (current) drug therapy: Secondary | ICD-10-CM

## 2017-07-01 DIAGNOSIS — F39 Unspecified mood [affective] disorder: Secondary | ICD-10-CM | POA: Diagnosis not present

## 2017-07-01 DIAGNOSIS — Z62810 Personal history of physical and sexual abuse in childhood: Secondary | ICD-10-CM | POA: Diagnosis present

## 2017-07-01 DIAGNOSIS — R45851 Suicidal ideations: Secondary | ICD-10-CM | POA: Diagnosis present

## 2017-07-01 DIAGNOSIS — F3181 Bipolar II disorder: Secondary | ICD-10-CM | POA: Diagnosis not present

## 2017-07-01 DIAGNOSIS — Z91128 Patient's intentional underdosing of medication regimen for other reason: Secondary | ICD-10-CM | POA: Diagnosis not present

## 2017-07-01 DIAGNOSIS — F314 Bipolar disorder, current episode depressed, severe, without psychotic features: Secondary | ICD-10-CM | POA: Diagnosis present

## 2017-07-01 DIAGNOSIS — Z915 Personal history of self-harm: Secondary | ICD-10-CM

## 2017-07-01 DIAGNOSIS — F4310 Post-traumatic stress disorder, unspecified: Secondary | ICD-10-CM | POA: Diagnosis present

## 2017-07-01 DIAGNOSIS — O99345 Other mental disorders complicating the puerperium: Secondary | ICD-10-CM

## 2017-07-01 LAB — COMPREHENSIVE METABOLIC PANEL
ALBUMIN: 4.2 g/dL (ref 3.5–5.0)
ALK PHOS: 86 U/L (ref 38–126)
ALT: 14 U/L (ref 14–54)
AST: 15 U/L (ref 15–41)
Anion gap: 8 (ref 5–15)
BILIRUBIN TOTAL: 1.2 mg/dL (ref 0.3–1.2)
BUN: 13 mg/dL (ref 6–20)
CALCIUM: 9.7 mg/dL (ref 8.9–10.3)
CO2: 24 mmol/L (ref 22–32)
CREATININE: 0.57 mg/dL (ref 0.44–1.00)
Chloride: 107 mmol/L (ref 101–111)
GFR calc Af Amer: >60 mL/min (ref >60)
GLUCOSE: 98 mg/dL (ref 65–99)
Potassium: 3.7 mmol/L (ref 3.5–5.1)
Sodium: 139 mmol/L (ref 135–145)
TOTAL PROTEIN: 7.3 g/dL (ref 6.5–8.1)

## 2017-07-01 LAB — CBC
HEMATOCRIT: 36.7 % (ref 36.0–46.0)
HEMOGLOBIN: 12 g/dL (ref 12.0–15.0)
MCH: 29.3 pg (ref 26.0–34.0)
MCHC: 32.7 g/dL (ref 30.0–36.0)
MCV: 89.7 fL (ref 78.0–100.0)
Platelets: 237 10*3/uL (ref 150–400)
RBC: 4.09 MIL/uL (ref 3.87–5.11)
RDW: 13.4 % (ref 11.5–15.5)
WBC: 6.1 10*3/uL (ref 4.0–10.5)

## 2017-07-01 LAB — URINALYSIS, ROUTINE W REFLEX MICROSCOPIC
BACTERIA UA: NONE SEEN
BILIRUBIN URINE: NEGATIVE
GLUCOSE, UA: NEGATIVE mg/dL
HGB URINE DIPSTICK: NEGATIVE
KETONES UR: NEGATIVE mg/dL
NITRITE: NEGATIVE
PROTEIN: NEGATIVE mg/dL
Specific Gravity, Urine: 1.023 (ref 1.005–1.030)
pH: 6 (ref 5.0–8.0)

## 2017-07-01 LAB — RAPID URINE DRUG SCREEN, HOSP PERFORMED
AMPHETAMINES: NOT DETECTED
Barbiturates: NOT DETECTED
Benzodiazepines: NOT DETECTED
Cocaine: NOT DETECTED
Opiates: NOT DETECTED
Tetrahydrocannabinol: NOT DETECTED

## 2017-07-01 LAB — TSH: TSH: 1.421 u[IU]/mL (ref 0.350–4.500)

## 2017-07-01 LAB — PREGNANCY, URINE: Preg Test, Ur: NEGATIVE

## 2017-07-01 LAB — HEMOGLOBIN A1C
HEMOGLOBIN A1C: 5 % (ref 4.8–5.6)
Mean Plasma Glucose: 96.8 mg/dL

## 2017-07-01 MED ORDER — PANTOPRAZOLE SODIUM 40 MG PO TBEC
40.0000 mg | DELAYED_RELEASE_TABLET | Freq: Every day | ORAL | Status: DC
  Administered 2017-07-02 – 2017-07-05 (×4): 40 mg via ORAL
  Filled 2017-07-01 (×6): qty 1

## 2017-07-01 MED ORDER — LAMOTRIGINE 25 MG PO TABS
25.0000 mg | ORAL_TABLET | Freq: Every day | ORAL | Status: DC
  Administered 2017-07-02 – 2017-07-05 (×4): 25 mg via ORAL
  Filled 2017-07-01 (×8): qty 1

## 2017-07-01 MED ORDER — PRENATAL MULTIVITAMIN CH
1.0000 | ORAL_TABLET | Freq: Every day | ORAL | Status: DC
  Administered 2017-07-01 – 2017-07-05 (×5): 1 via ORAL
  Filled 2017-07-01 (×7): qty 1

## 2017-07-01 MED ORDER — ALUM & MAG HYDROXIDE-SIMETH 200-200-20 MG/5ML PO SUSP: 30.0000 mL | ORAL | Status: DC | PRN

## 2017-07-01 MED ORDER — TRAZODONE HCL 50 MG PO TABS
25.0000 mg | ORAL_TABLET | Freq: Every evening | ORAL | Status: DC | PRN
  Administered 2017-07-04: 25 mg via ORAL
  Filled 2017-07-01: qty 1

## 2017-07-01 MED ORDER — OLANZAPINE 2.5 MG PO TABS
2.5000 mg | ORAL_TABLET | Freq: Every day | ORAL | Status: DC
  Administered 2017-07-01: 2.5 mg via ORAL
  Filled 2017-07-01 (×2): qty 1

## 2017-07-01 MED ORDER — TRAZODONE HCL 50 MG PO TABS
50.0000 mg | ORAL_TABLET | Freq: Every evening | ORAL | Status: DC | PRN
  Filled 2017-07-01 (×2): qty 1

## 2017-07-01 MED ORDER — MAGNESIUM HYDROXIDE 400 MG/5ML PO SUSP: 30.0000 mL | Freq: Every day | ORAL | Status: DC | PRN

## 2017-07-01 MED ORDER — HYDROXYZINE HCL 25 MG PO TABS: 25.0000 mg | ORAL_TABLET | Freq: Four times a day (QID) | ORAL | Status: DC | PRN

## 2017-07-01 MED ORDER — FLUOXETINE HCL 20 MG PO CAPS
40.0000 mg | ORAL_CAPSULE | Freq: Two times a day (BID) | ORAL | Status: DC
  Administered 2017-07-01: 40 mg via ORAL
  Filled 2017-07-01 (×3): qty 2

## 2017-07-01 MED ORDER — ACETAMINOPHEN 325 MG PO TABS
650.0000 mg | ORAL_TABLET | Freq: Four times a day (QID) | ORAL | Status: DC | PRN
  Administered 2017-07-01: 650 mg via ORAL
  Filled 2017-07-01: qty 2

## 2017-07-01 MED ORDER — PANTOPRAZOLE SODIUM 40 MG PO TBEC
40.0000 mg | DELAYED_RELEASE_TABLET | Freq: Two times a day (BID) | ORAL | Status: DC
  Administered 2017-07-01: 40 mg via ORAL
  Filled 2017-07-01 (×3): qty 1

## 2017-07-01 MED ORDER — ARIPIPRAZOLE 5 MG PO TABS
5.0000 mg | ORAL_TABLET | Freq: Every day | ORAL | Status: DC
  Administered 2017-07-01: 5 mg via ORAL
  Filled 2017-07-01 (×2): qty 1

## 2017-07-01 NOTE — Tx Team (Signed)
Interdisciplinary Treatment and Diagnostic Plan Update  07/01/2017 Time of Session: [place] MRN: [id number]  Principal Diagnosis: <principal problem not specified>  Secondary Diagnoses: Active Problems:   Bipolar 1 disorder, depressed, severe (HCC)   Current Medications:  Current Facility-Administered Medications  Medication Dose Route Frequency Provider Last Rate Last Dose  . acetaminophen (TYLENOL) tablet 650 mg  650 mg Oral Q6H PRN Laverle Hobby, PA-C      . alum & mag hydroxide-simeth (MAALOX/MYLANTA) 200-200-20 MG/5ML suspension 30 mL  30 mL Oral Q4H PRN Laverle Hobby, PA-C      . hydrOXYzine (ATARAX/VISTARIL) tablet 25 mg  25 mg Oral Q6H PRN Laverle Hobby, PA-C      . lamoTRIgine (LAMICTAL) tablet 25 mg  25 mg Oral Daily Cobos, Fernando A, MD      . magnesium hydroxide (MILK OF MAGNESIA) suspension 30 mL  30 mL Oral Daily PRN Laverle Hobby, PA-C      . OLANZapine (ZYPREXA) tablet 2.5 mg  2.5 mg Oral QHS Cobos, Myer Peer, MD      . Derrill Memo ON 07/02/2017] pantoprazole (PROTONIX) EC tablet 40 mg  40 mg Oral Daily Cobos, Fernando A, MD      . prenatal multivitamin tablet 1 tablet  1 tablet Oral Daily Laverle Hobby, PA-C   1 tablet at 07/01/17 7062  . traZODone (DESYREL) tablet 25 mg  25 mg Oral QHS PRN Cobos, Myer Peer, MD       PTA Medications: Medications Prior to Admission  Medication Sig Dispense Refill Last Dose  . Etonogestrel (NEXPLANON [place]) Inject 1 application into the skin. Currently implanted   07/01/2017  . FLUoxetine (PROZAC) 40 MG capsule Take 1 capsule (40 mg total) by mouth 2 (two) times daily. 60 capsule 5 Past Week  . pantoprazole (PROTONIX) 40 MG tablet Take 1 tablet (40 mg total) by mouth 2 (two) times daily before a meal. Take Protonix 40 mg po twice a day for one week, then once daily for 2 months. 60 tablet 1 never filled    Patient Stressors: Financial difficulties Marital or family conflict Medication change or  noncompliance  Patient Strengths: Capable of independent living General fund of knowledge Motivation for treatment/growth Supportive family/friends  Treatment Modalities: Medication Management, Group therapy, Case management,  1 to 1 session with clinician, Psychoeducation, Recreational therapy.   Physician Treatment Plan for Primary Diagnosis: <principal problem not specified> Long Term Goal(s): Improvement in symptoms so as ready for discharge Improvement in symptoms so as ready for discharge   Short Term Goals: Ability to identify changes in lifestyle to reduce recurrence of condition will improve Ability to maintain clinical measurements within normal limits will improve Ability to identify changes in lifestyle to reduce recurrence of condition will improve Ability to verbalize feelings will improve Ability to disclose and discuss suicidal ideas Ability to demonstrate self-control will improve Ability to identify and develop effective coping behaviors will improve  Medication Management: Evaluate patient's response, side effects, and tolerance of medication regimen.  Therapeutic Interventions: 1 to 1 sessions, Unit Group sessions and Medication administration.  Evaluation of Outcomes: Not Met  Physician Treatment Plan for Secondary Diagnosis: Active Problems:   Bipolar 1 disorder, depressed, severe ([place])  Long Term Goal(s): Improvement in symptoms so as ready for discharge Improvement in symptoms so as ready for discharge   Short Term Goals: Ability to identify changes in lifestyle to reduce recurrence of condition will improve Ability to maintain clinical measurements within normal  limits will improve Ability to identify changes in lifestyle to reduce recurrence of condition will improve Ability to verbalize feelings will improve Ability to disclose and discuss suicidal ideas Ability to demonstrate self-control will improve Ability to identify and develop effective  coping behaviors will improve     Medication Management: Evaluate patient's response, side effects, and tolerance of medication regimen.  Therapeutic Interventions: 1 to 1 sessions, Unit Group sessions and Medication administration.  Evaluation of Outcomes: Not Met   RN Treatment Plan for Primary Diagnosis: <principal problem not specified> Long Term Goal(s): Knowledge of disease and therapeutic regimen to maintain health will improve  Short Term Goals: Ability to identify and develop effective coping behaviors will improve and Compliance with prescribed medications will improve  Medication Management: RN will administer medications as ordered by provider, will assess and evaluate patient's response and provide education to patient for prescribed medication. RN will report any adverse and/or side effects to prescribing provider.  Therapeutic Interventions: 1 on 1 counseling sessions, Psychoeducation, Medication administration, Evaluate responses to treatment, Monitor vital signs and CBGs as ordered, Perform/monitor CIWA, COWS, AIMS and Fall Risk screenings as ordered, Perform wound care treatments as ordered.  Evaluation of Outcomes: Not Met   LCSW Treatment Plan for Primary Diagnosis: <principal problem not specified> Long Term Goal(s): Safe transition to appropriate next level of care at discharge, Engage patient in therapeutic group addressing interpersonal concerns.  Short Term Goals: Engage patient in aftercare planning with referrals and resources, Increase social support and Increase skills for wellness and recovery  Therapeutic Interventions: Assess for all discharge needs, 1 to 1 time with Social worker, Explore available resources and support systems, Assess for adequacy in community support network, Educate family and significant other(s) on suicide prevention, Complete Psychosocial Assessment, Interpersonal group therapy.  Evaluation of Outcomes: Not Met   Progress in  Treatment: Attending groups: No. Participating in groups: No. Taking medication as prescribed: Yes. Toleration medication: Yes. Family/Significant other contact made: No, will contact:  when given permission Patient understands diagnosis: Yes. Discussing patient identified problems/goals with staff: Yes. Medical problems stabilized or resolved: Yes. Denies suicidal/homicidal ideation: Yes. Issues/concerns per patient self-inventory: No. Other: none  New problem(s) identified: No, Describe:  none  New Short Term/Long Term Goal(s):  Discharge Plan or Barriers:   Reason for Continuation of Hospitalization: Depression Medication stabilization  Estimated Length of Stay: 3-5 days.  Attendees: Patient: 07/01/2017   Physician: Dr Parke Poisson, MD 07/01/2017  Nursing: Mayra Neer, RN 07/01/2017   RN Care Manager: 07/01/2017   Social Worker: Lurline Idol, LCSW 07/01/2017   Recreational Therapist:  07/01/2017   Other:  07/01/2017   Other:  07/01/2017   Other: 07/01/2017           Scribe for Treatment Team: Joanne Chars, [place] 07/01/2017 2:17 PM

## 2017-07-01 NOTE — Progress Notes (Addendum)
Patient ID: Michaela Williams, female   DOB: 06-22-1996, 21 y.o.   MRN: [id number]  Admission Note:  D: 21 yr female who presents [facility] in no acute distress for the treatment of SI and Depression. Pt appears flat and depressed. Pt was calm and cooperative with admission process. Pt presents with passive SI and contracts for safety upon admission. Pt denies AVH . Pt stated she has been decompensating since having her baby 4 months ago. Pt stated she tried to jump out a car 2 weeks ago in a suicide attempt, and was supposed to come in at that time but she kept putting it off and her boyfriend said she needed to come into the hospital.pt has stage ) tumor L-breast that she is supposed to get removed next month.    A:Skin was assessed by Michelle(RN) and found to be clear of any abnormal marks . PT searched and no contraband found, POC and unit policies explained and understanding verbalized. Consents obtained. Food and fluids offered, and fluids accepted.  R: Pt had no additional questions or concerns.

## 2017-07-01 NOTE — Progress Notes (Signed)
Recreation Therapy Notes  Date: 07/01/17 Time: 0930 Location: [address] Dayroom  Group Topic: Stress Management  Goal Area(s) Addresses:  Patient will verbalize importance of using healthy stress management.  Patient will identify positive emotions associated with healthy stress management.   Intervention: Stress Management  Activity :  Meditation.  LRT read a meditation to help patients visualize the strength and endurance mountains have and how they can use that same strength to get through the things they are faced with.  Education:  Stress Management, Discharge Planning.   Education Outcome: Acknowledges edcuation/In group clarification offered/Needs additional education  Clinical Observations/Feedback: Pt did not attend group.    Victorino Sparrow, LRT/CTRS         Ria Comment, Sasha Rueth A 07/01/2017 11:25 AM
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
## 2017-07-01 NOTE — Tx Team (Signed)
Initial Treatment Plan 07/01/2017 6:14 AM Michaela Williams [id number]    PATIENT STRESSORS: Financial difficulties Marital or family conflict Medication change or noncompliance   PATIENT STRENGTHS: Capable of independent living General fund of knowledge Motivation for treatment/growth Supportive family/friends   PATIENT IDENTIFIED PROBLEMS: depression  SI  "help with depression"  Stage 0 tumor L-breast               DISCHARGE CRITERIA:  Improved stabilization in mood, thinking, and/or behavior Verbal commitment to aftercare and medication compliance  PRELIMINARY DISCHARGE PLAN: Attend aftercare/continuing care group Outpatient therapy  PATIENT/FAMILY INVOLVEMENT: This treatment plan has been presented to and reviewed with the patient, Michaela Williams.  The patient and family have been given the opportunity to ask questions and make suggestions.  Providence Crosby, RN 07/01/2017, 6:14 AM

## 2017-07-01 NOTE — Progress Notes (Signed)
D: Pt on unit in dayroom.  Pt is soft spoken, attends group and participates.  Pt anxious about "getting my meds". A: Introduced self to pt.  Actively listened to pt and offered support and encouragement. Medications administered per order.  Q15 minute safety checks maintained. R: Pt is safe on the unit.  Pt is compliant with medications.  Pt verbally contracts for safety.  Will continue to monitor and assess

## 2017-07-01 NOTE — BHH Suicide Risk Assessment (Signed)
[facility] Admission Suicide Risk Assessment   Nursing information obtained from:   patient and chart  Demographic factors:   21 year old single female, lives with BF, has 30 month old child, she is employed and in college  Current Mental Status:   see below Loss Factors:   busy schedule, Post partum Historical Factors:   reports history of Bipolar Disorder diagnosis  Risk Reduction Factors:   resilience, sense of responsibility to child  Total Time spent with patient: 45 minutes Principal Problem:  Bipolar Disorder, Depressed     Continued Clinical Symptoms:  Alcohol Use Disorder Identification Test Final Score (AUDIT): 1 The "Alcohol Use Disorders Identification Test", Guidelines for Use in Primary Care, Second Edition.  World Pharmacologist [facility]). Score between 0-7:  no or low risk or alcohol related problems. Score between 8-15:  moderate risk of alcohol related problems. Score between 16-19:  high risk of alcohol related problems. Score 20 or above:  warrants further diagnostic evaluation for alcohol dependence and treatment.   CLINICAL FACTORS:  21 year old female, 4 months post partum, presents due to worsening depression with suicidal ideations. No psychotic symptoms. Reports history of hypomanic episodes and being diagnosed with Bipolar Disorder .      Psychiatric Specialty Exam: Physical Exam  ROS  Blood pressure 117/71, pulse 97, temperature 98.5 F (36.9 C), temperature source Oral, resp. rate 18, height 5\' 1"  (1.549 m), weight 55.3 kg (122 lb), SpO2 97 %, currently breastfeeding.Body mass index is 23.05 kg/m.   see admit note MSE   COGNITIVE FEATURES THAT CONTRIBUTE TO RISK:  Closed-mindedness and Loss of executive function    SUICIDE RISK:   Moderate:  Frequent suicidal ideation with limited intensity, and duration, some specificity in terms of plans, no associated intent, good self-control, limited dysphoria/symptomatology, some risk factors present, and  identifiable protective factors, including available and accessible social support.  PLAN OF CARE: Patient will be admitted to inpatient psychiatric unit for stabilization and safety. Will provide and encourage milieu participation. Provide medication management and maked adjustments as needed.  Will follow daily.    I certify that inpatient services furnished can reasonably be expected to improve the patient's condition.   Jenne Campus, MD 07/01/2017, 12:37 PM

## 2017-07-01 NOTE — Plan of Care (Signed)
Patient verbally contracts for safety at this time.  Verbalizes to this Probation officer that she agrees to come to staff if feelings of harm toward self arise.
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
## 2017-07-01 NOTE — Progress Notes (Signed)
D: Patient alert and oriented. Affect/mood: Flat, depressed. Denies SI, HI, AVH at this time. Patient was reassessed after one incident of nausea and vomiting this morning. Patient endorses feeling dizzy during this occurrence. Vital signs obtained and within normal limits. B/P: 117/71, Spo2: 99 RA, T: 98.5,  P: 97. Patient was ambulated back to room which at that time was offered ginger ale. Encouraged to take small sips and notify staff of further N/V. Patient verbalizes understanding. Denies pain. Urine sample collected.  A: Scheduled medications administered to patient per MD order. Support and encouragement provided. Routine safety checks conducted every 15 minutes. Patient informed to notify staff with problems or concerns.  R: No adverse drug reactions noted. Patient contracts for safety at this time. Patient compliant with medications and treatment plan. Patient receptive, calm, and cooperative. Patient interacts minimally on the unit, retreats to room often. Patient remains safe at this time, currently lying in bed with eyes closed.

## 2017-07-01 NOTE — H&P (Signed)
Behavioral Health Medical Screening Exam  Michaela Williams is an 21 y.o. female.who endorses a hx of Bipolar d/o diagnosed prior to age 3. She is not seeing a psychiatrist but is currently taking both Prozac and Zoloft. She is endorsing just coming out of an extended period of hypomania. 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 She is endorsing SI with plan, but denies HI, AVH, paranoia or delusional thoughts. She denies any significant comorbid conditions, apart from breast feeding a 42 month old son.  Total Time spent with patient: 20 minutes  Psychiatric Specialty Exam: Physical Exam  Constitutional: She is oriented to person, place, and time. She appears well-developed and well-nourished. No distress.  HENT:  Head: Normocephalic.  Eyes: Pupils are equal, round, and reactive to light.  Respiratory: Effort normal and breath sounds normal. No respiratory distress.  Neurological: She is alert and oriented to person, place, and time. No cranial nerve deficit.  Skin: Skin is warm and dry. She is not diaphoretic.  Psychiatric: Judgment normal. Her affect is labile. Her speech is delayed. She is withdrawn. Cognition and memory are impaired. She exhibits a depressed mood. She expresses suicidal ideation.    Review of Systems  Constitutional: Negative for chills, fever, malaise/fatigue and weight loss.  Respiratory: Negative for cough and shortness of breath.   Cardiovascular: Negative for chest pain.  Psychiatric/Behavioral: Positive for depression and suicidal ideas. Negative for hallucinations and substance abuse. The patient is nervous/anxious.     currently breastfeeding.There is no height or weight on file to calculate BMI.  General Appearance: Casual  Eye Contact:  Fair  Speech:  Slow  Volume:  Decreased  Mood:  Depressed  Affect:  Congruent  Thought Process:  Goal Directed  Orientation:  Full (Time, Place, and Person)  Thought Content:  Logical  Suicidal Thoughts:  Yes.  without  intent/plan  Homicidal Thoughts:  No  Memory:  Immediate;   Good  Judgement:  Good  Insight:  Good  Psychomotor Activity:  Normal  Concentration: Concentration: Fair  Recall:  Good  Fund of Knowledge:Good  Language: Good  Akathisia:  Negative  Handed:  Right  AIMS (if indicated):     Assets:  Desire for Improvement  Sleep:       Musculoskeletal: Strength & Muscle Tone: within normal limits Gait & Station: normal Patient leans: N/A  currently breastfeeding.  Recommendations:  Based on my evaluation the patient does not appear to have an emergency medical condition.  Laverle Hobby, PA-C 07/01/2017, 4:03 AM

## 2017-07-02 DIAGNOSIS — Z87891 Personal history of nicotine dependence: Secondary | ICD-10-CM

## 2017-07-02 DIAGNOSIS — F39 Unspecified mood [affective] disorder: Secondary | ICD-10-CM

## 2017-07-02 DIAGNOSIS — F314 Bipolar disorder, current episode depressed, severe, without psychotic features: Principal | ICD-10-CM

## 2017-07-02 DIAGNOSIS — R450 Nervousness: Secondary | ICD-10-CM

## 2017-07-02 DIAGNOSIS — Z818 Family history of other mental and behavioral disorders: Secondary | ICD-10-CM

## 2017-07-02 DIAGNOSIS — G4700 Insomnia, unspecified: Secondary | ICD-10-CM

## 2017-07-02 LAB — PROLACTIN: Prolactin: 184 ng/mL — ABNORMAL HIGH (ref 4.8–23.3)

## 2017-07-02 MED ORDER — ARIPIPRAZOLE 10 MG PO TABS
10.0000 mg | ORAL_TABLET | Freq: Every day | ORAL | Status: DC
  Administered 2017-07-02 – 2017-07-05 (×4): 10 mg via ORAL
  Filled 2017-07-02 (×7): qty 1
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
## 2017-07-02 NOTE — Progress Notes (Signed)
Pt attend wrap up group. Her day was a 3. Her goal was to regulate her medicine. She wanted to find reason why she need to stay alive. Pt said she talk to nurse early. Staff suggest she write in her journal.
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
## 2017-07-02 NOTE — Progress Notes (Signed)
Patient ID: Michaela Williams, female   DOB: 03-Jun-1996, 21 y.o.   MRN: [id number]  Pt currently presents with a flat affect and guarded, helpless behavior. Pt reports to writer that their goal is to "create a list of coping skills." Pt states "I didn't know how to start it." Pt reports good sleep without taking any current medications.   Pt's labs and vitals were monitored throughout the night. Pt given a 1:1 about emotional and mental status. Pt supported and encouraged to express concerns and questions. Pt educated on healthy coping skills, given handout and workbook to complete.   Pt's safety ensured with 15 minute and environmental checks. Pt currently denies SI/HI and A/V hallucinations. Pt verbally agrees to seek staff if SI/HI or A/VH occurs and to consult with staff before acting on any harmful thoughts. Will continue POC.

## 2017-07-02 NOTE — Progress Notes (Signed)
Patient ID: Michaela Williams, female   DOB: 1996/02/09, 21 y.o.   MRN: [id number]  DAR: Pt. Denies SI/HI and A/V Hallucinations. She reports that her sleep last night is fair, her appetite is poor, her energy level is low, and concentration is poor. She rates her depression, hopelessness, and anxiety levels 5/10. Patient does not report any pain or discomfort at this time. She did report that prior to breakfast she was experience dizziness, lightheadedness, and blurred vision. However, this subsided since. Support and encouragement provided to the patient. Scheduled medications administered to patient per physician's orders. Patient is minimal and forwards little at this time. She is seen in the milieu intermittently and is minimally interactive with her peers. Q15 minute checks are maintained for safety.

## 2017-07-02 NOTE — Plan of Care (Signed)
  Progressing Medication: Compliance with prescribed medication regimen will improve 07/02/2017 1449 - Progressing by Rockne Coons, RN
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
## 2017-07-03 LAB — LIPID PANEL
CHOL/HDL RATIO: 3.1 ratio
CHOLESTEROL: 196 mg/dL (ref 0–200)
HDL: 63 mg/dL (ref >40)
LDL Cholesterol: 124 mg/dL — ABNORMAL HIGH (ref 0–99)
TRIGLYCERIDES: 45 mg/dL (ref <150)
VLDL: 9 mg/dL (ref 0–40)

## 2017-07-03 LAB — BASIC METABOLIC PANEL
Anion gap: 8 (ref 5–15)
BUN: 16 mg/dL (ref 6–20)
CALCIUM: 9.4 mg/dL (ref 8.9–10.3)
CHLORIDE: 106 mmol/L (ref 101–111)
CO2: 23 mmol/L (ref 22–32)
CREATININE: 0.61 mg/dL (ref 0.44–1.00)
GFR calc non Af Amer: >60 mL/min (ref >60)
GLUCOSE: 101 mg/dL — AB (ref 65–99)
Potassium: 3.6 mmol/L (ref 3.5–5.1)
Sodium: 137 mmol/L (ref 135–145)

## 2017-07-03 LAB — HEMOGLOBIN A1C
Hgb A1c MFr Bld: 5 % (ref 4.8–5.6)
MEAN PLASMA GLUCOSE: 96.8 mg/dL

## 2017-07-03 MED ORDER — ARIPIPRAZOLE ER 400 MG IM SRER
400.0000 mg | INTRAMUSCULAR | Status: DC
Start: 2017-07-03 — End: 2017-07-05
  Administered 2017-07-03: 400 mg via INTRAMUSCULAR

## 2017-07-03 NOTE — Progress Notes (Signed)
Pt attend wrap up group. Her day was a 9. She got a chance to visit with her baby today. Her son is 27 months old. Pt said being here has help her a lot she had a routine in [employer] [employer]. Now she no longer have this and her world feels out of control. Pt said her mom got sick bills. This brought a lot of stress to here. She had time to reflect and work on improve herself.

## 2017-07-03 NOTE — Progress Notes (Signed)
Pt attend wrap up group. Her day was a 5. Her goal was to use her coping skills. She want to be able to use her list of things she came up with today.

## 2017-07-03 NOTE — Plan of Care (Signed)
  Progressing Activity: Interest or engagement in leisure activities will improve 07/03/2017 1640 - Progressing by Rockne Coons, RN Education: Ability to make informed decisions regarding treatment will improve 07/03/2017 1640 - Progressing by Rockne Coons, RN

## 2017-07-03 NOTE — Progress Notes (Signed)
Recreation Therapy Notes  Date: 07/03/17 Time: 0930 Location: 300 [address] Dayroom  Group Topic: Stress Management  Goal Area(s) Addresses:  Patient will verbalize importance of using healthy stress management.  Patient will identify positive emotions associated with healthy stress management.   Intervention: Stress Management  Activity :  Body Scan Meditation.  LRT introduced the stress management technique of meditation.  LRT played a meditation from the Calm app that guided them through a body scan to allow them to become aware of any sensations, tensions or uneasy feelings they may be experiencing.  Education:  Stress Management, Discharge Planning.   Education Outcome: Acknowledges edcuation/In group clarification offered/Needs additional education  Clinical Observations/Feedback: Pt did not attend group.    Victorino Sparrow, LRT/CTRS         Ria Comment, Samora Jernberg A 07/03/2017 11:06 AM
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
## 2017-07-03 NOTE — Progress Notes (Signed)
Nursing Progress Note [id number]  D) Patient presents with sullen mood and mild anxiety. Patient did attend group and was seen interactive in the milieu. Patient denies SI/HI/AVH or pain. Patient contracts for safety on the unit. Patient reports sleeping well without medication. Patient is cautious, minimal and forwards little when interacting with Probation officer. Patient denies concerns for writer this evening. No medications scheduled/requested this shift.  A) Emotional support given. 1:1 interaction and active listening provided. Snacks and fluids provided. Opportunities for questions or concerns presented to patient. Patient encouraged to continue to work on treatment goals. Labs, vital signs and patient behavior monitored throughout shift. Patient safety maintained with q15 min safety checks. Low fall risk precautions in place and reviewed with patient; patient verbalized understanding.  R) Patient receptive to interaction with nurse. Patient remains safe on the unit at this time. Patient denies any adverse medication reactions at this time. Patient is resting in bed without complaints. Will continue to monitor.

## 2017-07-03 NOTE — Progress Notes (Signed)
Patient ID: Michaela Williams, female   DOB: 01/02/1996, 21 y.o.   MRN: [id number]  DAR: Pt. Denies SI/HI and A/V Hallucinations. She reports that her sleep was fair last night, her appetite is fair, her energy level is low, and her concentration is poor. She rates her depression level 2/10, her hopelessness level 2/10, and her anxiety level 0/10. Patient does not report any pain or discomfort at this time. Support and encouragement provided to the patient. Scheduled medication administered to patient per physician's orders. Patient received her Abilify injection and tolerated well. She is seen in the milieu intermittently and is interacting with some peers. She is minimal with staff at this time. She had a visit with her child this afternoon and went well. Q15 minute checks are maintained for safety.
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
## 2017-07-04 NOTE — Progress Notes (Signed)
Adult Psychoeducational Group Note  Date:  07/04/2017 Time:  9:21 PM  Group Topic/Focus:  Wrap-Up Group:   The focus of this group is to help patients review their daily goal of treatment and discuss progress on daily workbooks.  Participation Level:  Active  Participation Quality:  Appropriate  Affect:  Appropriate  Cognitive:  Appropriate  Insight: Appropriate  Engagement in Group:  Engaged  Modes of Intervention:  Discussion  Additional Comments:  Patient attended group and said that her day was a 47. Her coping skills were going outside, writing and socializing.   Ysmael Hires W Kenadi Miltner 21/05/5519, 9:21 PM

## 2017-07-04 NOTE — [facility] Group Notes (Signed)
[facility] LCSW Group Therapy Note  Date/Time:    07/04/2017 10:00-11:00AM  Type of Therapy and Topic:  Group Therapy:  Healthy vs Unhealthy Coping Skills  Participation Level:  Active   Description of Group:  The focus of this group was to determine what unhealthy coping techniques typically are used by group members and what healthy coping techniques would be helpful in coping with various problems. Patients were guided in becoming aware of the differences between healthy and unhealthy coping techniques.  Patients were asked to identify healthy coping skills they plan to use when they leave the hospital to fill "the hole" that is left by discontinuing the use of unhealthy coping skills such as self-harm, drinking, and stopping medication.  Therapeutic Goals 1. Patients described the reasons for their hospitalization, which included unhealthy coping they used that led to the hospital stay 2. Patients defined and discussed healthy vs unhealthy coping techniques 3. Patients identified their preferred coping techniques and identified whether these were healthy or unhealthy 4. Patients determined 1-2 healthy coping skills they would like to become more familiar with and use more often 5. Patients provided support and ideas to each other  Summary of Patient Progress: During group, patient expressed that she is hospitalized due to suicidality, and also discussed the feeling that she gets from self-harming in order to punish herself when she feels she deserves it.  She was reluctant, but ultimately able to agree to consider that self-care is a positive, as opposed to a selfish act that takes her away from her infant.  She stated she would like to add running as a healthy coping skill, and possibly using ice in her hand instead of cutting herself.   Therapeutic Modalities Cognitive Behavioral Therapy Motivational Interviewing   Selmer Dominion, LCSW 07/04/2017, 11:08 AM

## 2017-07-04 NOTE — Progress Notes (Signed)
Nursing Progress Note [id number]  D) Patient presents with pleasant mood but remains mildly cautious and anxious. Patient reports she is most likely going home tomorrow and states, "my boyfriend and his family are my support". Patient did attend group. Patient is seen interactive in the milieu. Patient denies SI/HI/AVH or pain. Patient contracts for safety on the unit. Patient requests PRN Trazodone for sleep this evening.  A) Emotional support given. 1:1 interaction and active listening provided. Patient medicated as prescribed. Medications and plan of care reviewed with patient. Patient verbalized understanding without further questions. Snacks and fluids provided. Opportunities for questions or concerns presented to patient. Patient encouraged to continue to work on treatment goals. Labs, vital signs and patient behavior monitored throughout shift. Patient safety maintained with q15 min safety checks. Low fall risk precautions in place and reviewed with patient; patient verbalized understanding.  R) Patient receptive to interaction with nurse. Patient remains safe on the unit at this time. Patient denies any adverse medication reactions at this time. Patient is resting in bed without complaints. Will continue to monitor.

## 2017-07-04 NOTE — Progress Notes (Signed)
D. Pt presents with a sullen affect and guarded behavior- but pleasant during interactions. Pt observed in dayroom interacting with peer and engaging in group led by SW. Per pt's self inventory, pt rates her depression, hopelessness and anxiety a 0/0/0, respectively. Pt reports that her most important goal is to "make a list of things I like about me", and to "talk to someone".Pt currently denies SI/HI and AVH. A. Labs and vitals monitored. Pt compliant with medications. Pt supported emotionally and encouraged to express concerns and ask questions.   R. Pt remains safe with 15 minute checks. Will continue POC.

## 2017-07-04 NOTE — Plan of Care (Addendum)
Progressing:  Discussed healthy coping skills with patient. Pt able to identify 2 healthy coping skills (journaling and painting)

## 2017-07-05 DIAGNOSIS — O99345 Other mental disorders complicating the puerperium: Secondary | ICD-10-CM

## 2017-07-05 DIAGNOSIS — F3181 Bipolar II disorder: Secondary | ICD-10-CM

## 2017-07-05 MED ORDER — ARIPIPRAZOLE 10 MG PO TABS: 10.0000 mg | ORAL_TABLET | Freq: Every day | ORAL | 0 refills | Status: DC

## 2017-07-05 MED ORDER — ARIPIPRAZOLE ER 400 MG IM SRER: 400.0000 mg | INTRAMUSCULAR | 0 refills | Status: DC

## 2017-07-05 MED ORDER — HYDROXYZINE HCL 25 MG PO TABS: 25.0000 mg | ORAL_TABLET | Freq: Four times a day (QID) | ORAL | 0 refills | Status: DC | PRN

## 2017-07-05 MED ORDER — TRAZODONE HCL 50 MG PO TABS: 25.0000 mg | ORAL_TABLET | Freq: Every evening | ORAL | 0 refills | Status: DC | PRN

## 2017-07-05 MED ORDER — LAMOTRIGINE 25 MG PO TABS: 25.0000 mg | ORAL_TABLET | Freq: Every day | ORAL | 0 refills | Status: DC

## 2017-07-05 NOTE — [facility] Suicide Risk Assessment (Signed)
[place] INPATIENT:  Family/Significant Other Suicide Prevention Education  Suicide Prevention Education:  Contact Attempts: boyfriend Michaela Williams [phone] has been identified by the patient as the family member/significant other with whom the patient will be residing, and identified as the person(s) who will aid the patient in the event of a mental health crisis.  With written consent from the patient, two attempts were made to provide suicide prevention education, prior to and/or following the patient's discharge.  We were unsuccessful in providing suicide prevention education.  A suicide education pamphlet was given to the patient to share with family/significant other.  Date and time of first attempt:   07/05/2017  /  9:18 AM  Message left Date and time of second attempt:  07/05/2017   /   1:09 PM    Message left  Michaela Williams 07/05/2017, 9:17 AM
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
## 2017-07-05 NOTE — Progress Notes (Signed)
  [facility] Case Management Discharge Plan :  Will you be returning to the same living situation after discharge:  Yes,  with boyfriend and son At discharge, do you have transportation home?: Yes,  arranged by patient Do you have the ability to pay for your medications: Yes,  denies having barriers  Release of information consent forms completed and turned in to Medical Records by CSW.  Patient to Follow up at: [facility] Follow up on 07/13/2017.   Why:  Assessement with Hoyle Sauer on Monday 07/13/17 at 1:00PM. Medication management appt on Tuesday, 08/25/17 at 10:45AM with Elmarie Shiley NP. Please call at discharge to verify insurance and pay $20 deposit. Thank you.  Contact information: [place] [address] [phone]        [facility] Follow up.   Specialty:  Professional Counselor Why:  message left requesting counseling appt (12/28) Contact information: [address] [place] [address] [phone]           Next level of care provider has access to [place] and Suicide Prevention discussed: No.  Attempts made with boyfriend, brochure given to pt  Have you used any form of tobacco in the last 30 days? (Cigarettes, Smokeless Tobacco, Cigars, and/or Pipes): No  Has patient been referred to the Quitline?: N/A patient is not a smoker  Patient has been referred for addiction treatment: N/A  Maretta Los, LCSW 07/05/2017, 9:13 AM

## 2017-07-05 NOTE — Progress Notes (Signed)
D. Pt awake sitting up in bed- writing in journal-upon initial approach. Pt presents with an appropriate affect and improving mood- pleasant during interactions- although slightly guarded and soft spoken.. Pt currently denies pain,  SI/HI and AV hallucinations. Pt continues to report not sleeping well despite prn medication. Per pt's self inventory- pt rates her depression, hopelessness and anxiety a 0/0/0, respectively. Pt write that her most important goal today is "to get discharged". A. Labs and vitals monitored. Pt compliant with medications. Pt supported emotionally and encouraged to express concerns and ask questions.   R. Pt remains safe with 15 minute checks. Will continue POC.

## 2017-07-05 NOTE — Progress Notes (Signed)
Pt discharged to lobby- Boyfriend here to pick up pt. Pt was stable and appreciative at that time. All papers and prescriptions were given and valuables returned. Discussed suicide safety plan with pt. Verbal understanding expressed. Denies SI/HI and A/VH. Pt given opportunity to express concerns and ask questions.

## 2017-07-05 NOTE — BHH Group Notes (Signed)
[facility] LCSW Group Therapy Note  Date/Time:  07/05/2017 10:00-11:00AM  Type of Therapy and Topic:  Group Therapy:  Healthy and Unhealthy Supports  Participation Level:  Active   Description of Group:  Patients in this group were introduced to the idea of adding a variety of healthy supports to address the various needs in their lives. The picture on the front of Sunday's workbook was used to demonstrate why more supports are needed in every patient's life.  Patients identified and described healthy supports versus unhealthy supports in general, then gave examples of each in their own lives.   They discussed what additional healthy supports could be helpful in their recovery and wellness after discharge in order to prevent future hospitalizations.   An emphasis was placed on using counselor, doctor, therapy groups, 12-step groups, and problem-specific support groups to expand supports.  We also talked about how to deal with unhealthy supports through boundary-setting, psychoeducation with loved ones, and even termination of relationships.   Therapeutic Goals:   1)  discuss importance of adding supports to stay well once out of the hospital  2)  compare healthy versus unhealthy supports and identify some examples of each  3)  generate ideas and descriptions of healthy supports that can be added  4)  offer mutual support about how to address unhealthy supports  5)  encourage active participation in and adherence to discharge plan    Summary of Patient Progress:  The patient shared thoroughly throughout group in a manner that was very supportive of others and revealing of good insight.   Therapeutic Modalities:   Motivational Interviewing Brief Solution-Focused Therapy  Selmer Dominion, LCSW

## 2017-07-28 ENCOUNTER — Telehealth: Payer: Self-pay | Admitting: Physician Assistant

## 2017-07-28 NOTE — Telephone Encounter (Signed)
Called to remind pt of appt tomorrow.  DPR on file - Left message reminding them of the appt date, time and provider as well as which building.  Also reminded them to please be 15 minutes early and the 10 minute late policy.

## 2017-07-29 ENCOUNTER — Ambulatory Visit (INDEPENDENT_AMBULATORY_CARE_PROVIDER_SITE_OTHER): Payer: 59 | Admitting: Physician Assistant

## 2017-07-29 ENCOUNTER — Encounter: Payer: Self-pay | Admitting: Physician Assistant

## 2017-07-29 ENCOUNTER — Other Ambulatory Visit: Payer: Self-pay

## 2017-07-29 VITALS — BP 102/68 | HR 94 | Temp 97.7°F | Resp 16 | Ht 63.19 in | Wt 122.6 lb

## 2017-07-29 DIAGNOSIS — R300 Dysuria: Secondary | ICD-10-CM

## 2017-07-29 DIAGNOSIS — N3000 Acute cystitis without hematuria: Secondary | ICD-10-CM

## 2017-07-29 LAB — POCT URINALYSIS DIP (MANUAL ENTRY)
BILIRUBIN UA: NEGATIVE
Blood, UA: NEGATIVE
Glucose, UA: NEGATIVE mg/dL
Ketones, POC UA: NEGATIVE mg/dL
NITRITE UA: POSITIVE — AB
Protein Ur, POC: NEGATIVE mg/dL
Spec Grav, UA: 1.025 (ref 1.010–1.025)
Urobilinogen, UA: 0.2 E.U./dL
pH, UA: 5.5 (ref 5.0–8.0)

## 2017-07-29 MED ORDER — NITROFURANTOIN MONOHYD MACRO 100 MG PO CAPS: 100.0000 mg | ORAL_CAPSULE | Freq: Two times a day (BID) | ORAL | 0 refills | Status: AC
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
## 2017-07-29 NOTE — Patient Instructions (Addendum)
   Urinary Tract Infection, Adult A urinary tract infection (UTI) is an infection of any part of the urinary tract. The urinary tract includes the:  Kidneys.  Ureters.  Bladder.  Urethra.  These organs make, store, and get rid of pee (urine) in the body. Follow these instructions at home:  Take over-the-counter and prescription medicines only as told by your doctor.  If you were prescribed an antibiotic medicine, take it as told by your doctor. Do not stop taking the antibiotic even if you start to feel better.  Avoid the following drinks: ? Alcohol. ? Caffeine. ? Tea. ? Carbonated drinks.  Drink enough fluid to keep your pee clear or pale yellow.  Keep all follow-up visits as told by your doctor. This is important.  Make sure to: ? Empty your bladder often and completely. Do not to hold pee for long periods of time. ? Empty your bladder before and after sex. ? Wipe from front to back after a bowel movement if you are female. Use each tissue one time when you wipe. Contact a doctor if:  You have back pain.  You have a fever.  You feel sick to your stomach (nauseous).  You throw up (vomit).  Your symptoms do not get better after 3 days.  Your symptoms go away and then come back. Get help right away if:  You have very bad back pain.  You have very bad lower belly (abdominal) pain.  You are throwing up and cannot keep down any medicines or water. This information is not intended to replace advice given to you by your health care provider. Make sure you discuss any questions you have with your health care provider. Document Released: 12/10/2007 Document Revised: 11/29/2015 Document Reviewed: 05/14/2015 Elsevier Interactive Patient Education  2018 Elsevier Inc.    IF you received an x-ray today, you will receive an invoice from [facility]. Please contact [facility] at [phone] with questions or concerns regarding your invoice.   IF you  received labwork today, you will receive an invoice from [facility]. Please contact [facility] at [phone] with questions or concerns regarding your invoice.   Our billing staff will not be able to assist you with questions regarding bills from these companies.  You will be contacted with the lab results as soon as they are available. The fastest way to get your results is to activate your My Chart account. Instructions are located on the last page of this paperwork. If you have not heard from us regarding the results in 2 weeks, please contact this office.     

## 2017-07-31 LAB — URINE CULTURE

## 2017-08-02 ENCOUNTER — Other Ambulatory Visit: Payer: Self-pay | Admitting: Physician Assistant

## 2017-08-02 DIAGNOSIS — N3000 Acute cystitis without hematuria: Secondary | ICD-10-CM

## 2017-08-02 MED ORDER — CEPHALEXIN 500 MG PO CAPS: 500.0000 mg | ORAL_CAPSULE | Freq: Two times a day (BID) | ORAL | 0 refills | Status: AC

## 2017-10-07 ENCOUNTER — Encounter: Payer: Self-pay | Admitting: Physician Assistant

## 2017-11-02 ENCOUNTER — Ambulatory Visit: Payer: Self-pay | Admitting: Certified Nurse Midwife

## 2017-11-06 ENCOUNTER — Emergency Department (HOSPITAL_COMMUNITY)
Admission: EM | Admit: 2017-11-06 | Discharge: 2017-11-07 | Disposition: A | Discharge status: Home / Self Care | Payer: 59 | Attending: Emergency Medicine | Admitting: Emergency Medicine

## 2017-11-06 DIAGNOSIS — R51 Headache: Secondary | ICD-10-CM

## 2017-11-06 DIAGNOSIS — Z853 Personal history of malignant neoplasm of breast: Secondary | ICD-10-CM | POA: Insufficient documentation

## 2017-11-06 DIAGNOSIS — R519 Headache, unspecified: Secondary | ICD-10-CM

## 2017-11-06 DIAGNOSIS — R55 Syncope and collapse: Secondary | ICD-10-CM

## 2017-11-06 DIAGNOSIS — Z79899 Other long term (current) drug therapy: Secondary | ICD-10-CM | POA: Insufficient documentation

## 2017-11-06 DIAGNOSIS — Z87891 Personal history of nicotine dependence: Secondary | ICD-10-CM | POA: Diagnosis not present

## 2017-11-06 DIAGNOSIS — J45909 Unspecified asthma, uncomplicated: Secondary | ICD-10-CM | POA: Insufficient documentation

## 2017-11-06 LAB — I-STAT BETA HCG BLOOD, ED (MC, WL, AP ONLY)

## 2017-11-06 NOTE — ED Triage Notes (Signed)
Pt CC of headache. Pt states had headache, when walking to car. Pt states she believed she lost consciousness and fell. No visible bruising on patient body nor head. Pt states cannot remember events lately. Pt stated has been feeling stress and "wozzy" throughout the day.  Pt alert and oriented x4.

## 2017-11-07 DIAGNOSIS — R55 Syncope and collapse: Secondary | ICD-10-CM | POA: Diagnosis not present

## 2017-11-07 LAB — BASIC METABOLIC PANEL
ANION GAP: 10 (ref 5–15)
BUN: 16 mg/dL (ref 6–20)
CALCIUM: 9.1 mg/dL (ref 8.9–10.3)
CO2: 22 mmol/L (ref 22–32)
Chloride: 105 mmol/L (ref 101–111)
Creatinine, Ser: 0.92 mg/dL (ref 0.44–1.00)
Glucose, Bld: 106 mg/dL — ABNORMAL HIGH (ref 65–99)
Potassium: 4 mmol/L (ref 3.5–5.1)
Sodium: 137 mmol/L (ref 135–145)

## 2017-11-07 LAB — CBC WITH DIFFERENTIAL/PLATELET
BASOS ABS: 0 10*3/uL (ref 0.0–0.1)
BASOS PCT: 0 %
Eosinophils Absolute: 0.1 10*3/uL (ref 0.0–0.7)
Eosinophils Relative: 0 %
HEMATOCRIT: 38.6 % (ref 36.0–46.0)
HEMOGLOBIN: 12.6 g/dL (ref 12.0–15.0)
Lymphocytes Relative: 13 %
Lymphs Abs: 2.1 10*3/uL (ref 0.7–4.0)
MCH: 29.9 pg (ref 26.0–34.0)
MCHC: 32.6 g/dL (ref 30.0–36.0)
MCV: 91.7 fL (ref 78.0–100.0)
MONOS PCT: 6 %
Monocytes Absolute: 0.9 10*3/uL (ref 0.1–1.0)
NEUTROS ABS: 12.7 10*3/uL — AB (ref 1.7–7.7)
NEUTROS PCT: 81 %
Platelets: 248 10*3/uL (ref 150–400)
RBC: 4.21 MIL/uL (ref 3.87–5.11)
RDW: 13.6 % (ref 11.5–15.5)
WBC: 15.7 10*3/uL — ABNORMAL HIGH (ref 4.0–10.5)

## 2017-11-07 NOTE — ED Notes (Signed)
Pt ambulates well with steady gait

## 2017-11-07 NOTE — Discharge Instructions (Signed)
Please make an appointment with your doctor Return if worsening
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
## 2017-12-07 ENCOUNTER — Ambulatory Visit (INDEPENDENT_AMBULATORY_CARE_PROVIDER_SITE_OTHER): Payer: 59 | Admitting: Certified Nurse Midwife

## 2017-12-07 ENCOUNTER — Encounter: Payer: Self-pay | Admitting: Certified Nurse Midwife

## 2017-12-07 VITALS — BP 120/78 | HR 91 | Ht 61.0 in | Wt 146.4 lb

## 2017-12-07 DIAGNOSIS — Z30011 Encounter for initial prescription of contraceptive pills: Secondary | ICD-10-CM | POA: Diagnosis not present

## 2017-12-07 MED ORDER — NORETHIN-ETH ESTRAD-FE BIPHAS 1 MG-10 MCG / 10 MCG PO TABS: 1.0000 | ORAL_TABLET | Freq: Every day | ORAL | 4 refills | Status: DC

## 2017-12-07 NOTE — Progress Notes (Signed)
Pt is here for Nexplanon removal. Pt states her menstrual cycle stopped after getting nexplanon placed in November, then cycle returned around March. Pt now states she is experiencing a cycle for the past 3 weeks straight. Pt states she was breastfeeding and stopped in December.

## 2017-12-08 ENCOUNTER — Encounter: Payer: Self-pay | Admitting: Certified Nurse Midwife
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
## 2018-01-06 ENCOUNTER — Ambulatory Visit: Payer: Self-pay

## 2018-01-06 ENCOUNTER — Ambulatory Visit (INDEPENDENT_AMBULATORY_CARE_PROVIDER_SITE_OTHER): Payer: 59 | Admitting: Family Medicine

## 2018-01-06 ENCOUNTER — Encounter: Payer: Self-pay | Admitting: Family Medicine

## 2018-01-06 ENCOUNTER — Other Ambulatory Visit: Payer: Self-pay

## 2018-01-06 VITALS — BP 112/70 | HR 104 | Temp 98.6°F | Resp 18 | Ht 61.0 in | Wt 150.4 lb

## 2018-01-06 DIAGNOSIS — R42 Dizziness and giddiness: Secondary | ICD-10-CM | POA: Diagnosis not present

## 2018-01-06 DIAGNOSIS — R350 Frequency of micturition: Secondary | ICD-10-CM | POA: Diagnosis not present

## 2018-01-06 DIAGNOSIS — R55 Syncope and collapse: Secondary | ICD-10-CM | POA: Diagnosis not present

## 2018-01-06 DIAGNOSIS — F3181 Bipolar II disorder: Secondary | ICD-10-CM

## 2018-01-06 DIAGNOSIS — G43009 Migraine without aura, not intractable, without status migrainosus: Secondary | ICD-10-CM

## 2018-01-06 DIAGNOSIS — N939 Abnormal uterine and vaginal bleeding, unspecified: Secondary | ICD-10-CM

## 2018-01-06 DIAGNOSIS — O99345 Other mental disorders complicating the puerperium: Secondary | ICD-10-CM | POA: Diagnosis not present

## 2018-01-06 LAB — POCT URINALYSIS DIP (MANUAL ENTRY)
BILIRUBIN UA: NEGATIVE mg/dL
Bilirubin, UA: NEGATIVE
GLUCOSE UA: NEGATIVE mg/dL
Nitrite, UA: NEGATIVE
Protein Ur, POC: NEGATIVE mg/dL
SPEC GRAV UA: 1.01 (ref 1.010–1.025)
Urobilinogen, UA: 0.2 E.U./dL
pH, UA: 6.5 (ref 5.0–8.0)

## 2018-01-06 LAB — POCT URINE PREGNANCY: Preg Test, Ur: NEGATIVE

## 2018-01-06 MED ORDER — RIZATRIPTAN BENZOATE 10 MG PO TBDP: 10.0000 mg | ORAL_TABLET | Freq: Once | ORAL | 3 refills | Status: DC

## 2018-01-06 NOTE — Telephone Encounter (Signed)
Patient called in with c/o "fainting." She says "I fainted Friday, Saturday and yesterday. I don't know how long I was out, but my boyfriend saw it yesterday and says a few minutes. When I got up, I was a little groggy. I figured it may be some medication I am on, but not sure. I have a migraine and that's been every day with nausea and vomiting. I also have dizziness. I called the doctor who put me on the medicine and was told to get an appointment with my primary." According to protocol, see PCP within 2 weeks, appointment scheduled for today at 1720 with Dr. Tamala Julian, care advice given, patient verbalized understanding.  Reason for Disposition . Simple fainting is a chronic symptom (has occurred multiple times)  Answer Assessment - Initial Assessment Questions 1. ONSET: "How long were you unconscious?" (minutes) "When did it happen?"     Friday, Saturday and yesterday; maybe a few minutes, I don't know 2. CONTENT: "What happened during period of unconsciousness?" (e.g., seizure activity)      I'm not sure; boyfriend said I was just laying there 3. MENTAL STATUS: "Alert and oriented now?" (oriented x 3 = name, month, location)      Yes 4. TRIGGER: "What do you think caused the fainting?" "What were you doing just before you fainted?"  (e.g., exercise, sudden standing up, prolonged standing)     I don't know, maybe my medications 5. RECURRENT SYMPTOM: "Have you ever passed out before?" If so, ask: "When was the last time?" and "What happened that time?"      I do have a history when taking certain medicines 6. INJURY: "Did you sustain any injury during the fall?"      No 7. CARDIAC SYMPTOMS: "Have you had any of the following symptoms: chest pain, difficulty breathing, palpitations?"     Having an issue breathing before-felt like chest locked up, couldn't get a breath, then passed out 8. NEUROLOGIC SYMPTOMS: "Have you had any of the following symptoms: headache, numbness, vertigo, weakness?"  Dizziness and migraines everyday 9. GI SYMPTOMS: "Have you had any of the following symptoms: abdominal pain, vomiting, diarrhea, blood in stools?"     Nausea, vomiting every day 10. OTHER SYMPTOMS: "Do you have any other symptoms?"       No 11. PREGNANCY: "Is there any chance you are pregnant?" "When was your last menstrual period?"       No, birth control in arm, no cycles.  Protocols used: [facility]

## 2018-01-06 NOTE — Patient Instructions (Addendum)
Call gynecologist and ask about stopping the birth control pill.  IF you received an x-ray today, you will receive an invoice from [facility]. Please contact [facility] at [phone] with questions or concerns regarding your invoice.   IF you received labwork today, you will receive an invoice from [facility]. Please contact [facility] at [phone] with questions or concerns regarding your invoice.   Our billing staff will not be able to assist you with questions regarding bills from these companies.  You will be contacted with the lab results as soon as they are available. The fastest way to get your results is to activate your My Chart account. Instructions are located on the last page of this paperwork. If you have not heard from [place] regarding the results in 2 weeks, please contact this office.     Migraine Headache A migraine headache is an intense, throbbing pain on one side or both sides of the head. Migraines may also cause other symptoms, such as nausea, vomiting, and sensitivity to light and noise. What are the causes? Doing or taking certain things may also trigger migraines, such as:  Alcohol.  Smoking.  Medicines, such as: ? Medicine used to treat chest pain (nitroglycerine). ? Birth control pills. ? Estrogen pills. ? Certain blood pressure medicines.  Aged cheeses, chocolate, or caffeine.  Foods or drinks that contain nitrates, glutamate, aspartame, or tyramine.  Physical activity.  Other things that may trigger a migraine include:  Menstruation.  Pregnancy.  Hunger.  Stress, lack of sleep, too much sleep, or fatigue.  Weather changes.  What increases the risk? The following factors may make you more likely to experience migraine headaches:  Age. Risk increases with age.  Family history of migraine headaches.  Being Caucasian.  Depression and anxiety.  Obesity.  Being a woman.  Having a hole in the heart (patent foramen  ovale) or other heart problems.  What are the signs or symptoms? The main symptom of this condition is pulsating or throbbing pain. Pain may:  Happen in any area of the head, such as on one side or both sides.  Interfere with daily activities.  Get worse with physical activity.  Get worse with exposure to bright lights or loud noises.  Other symptoms may include:  Nausea.  Vomiting.  Dizziness.  General sensitivity to bright lights, loud noises, or smells.  Before you get a migraine, you may get warning signs that a migraine is developing (aura). An aura may include:  Seeing flashing lights or having blind spots.  Seeing bright spots, halos, or zigzag lines.  Having tunnel vision or blurred vision.  Having numbness or a tingling feeling.  Having trouble talking.  Having muscle weakness.  How is this diagnosed? A migraine headache can be diagnosed based on:  Your symptoms.  A physical exam.  Tests, such as CT scan or MRI of the head. These imaging tests can help rule out other causes of headaches.  Taking fluid from the spine (lumbar puncture) and analyzing it (cerebrospinal fluid analysis, or CSF analysis).  How is this treated? A migraine headache is usually treated with medicines that:  Relieve pain.  Relieve nausea.  Prevent migraines from coming back.  Treatment may also include:  Acupuncture.  Lifestyle changes like avoiding foods that trigger migraines.  Follow these instructions at home: Medicines  Take over-the-counter and prescription medicines only as told by your health care provider.  Do not drive or use heavy machinery while taking prescription pain medicine.  To prevent or treat constipation while you are taking prescription pain medicine, your health care provider may recommend that you: ? Drink enough fluid to keep your urine clear or pale yellow. ? Take over-the-counter or prescription medicines. ? Eat foods that are high in  fiber, such as fresh fruits and vegetables, whole grains, and beans. ? Limit foods that are high in fat and processed sugars, such as fried and sweet foods. Lifestyle  Avoid alcohol use.  Do not use any products that contain nicotine or tobacco, such as cigarettes and e-cigarettes. If you need help quitting, ask your health care provider.  Get at least 8 hours of sleep every night.  Limit your stress. General instructions   Keep a journal to find out what may trigger your migraine headaches. For example, write down: ? What you eat and drink. ? How much sleep you get. ? Any change to your diet or medicines.  If you have a migraine: ? Avoid things that make your symptoms worse, such as bright lights. ? It may help to lie down in a dark, quiet room. ? Do not drive or use heavy machinery. ? Ask your health care provider what activities are safe for you while you are experiencing symptoms.  Keep all follow-up visits as told by your health care provider. This is important. Contact a health care provider if:  You develop symptoms that are different or more severe than your usual migraine symptoms. Get help right away if:  Your migraine becomes severe.  You have a fever.  You have a stiff neck.  You have vision loss.  Your muscles feel weak or like you cannot control them.  You start to lose your balance often.  You develop trouble walking.  You faint. This information is not intended to replace advice given to you by your health care provider. Make sure you discuss any questions you have with your health care provider. Document Released: 06/23/2005 Document Revised: 01/11/2016 Document Reviewed: 12/10/2015 Elsevier Interactive Patient Education  2017 [employer].

## 2018-01-06 NOTE — Progress Notes (Signed)
Subjective:    Patient ID: Michaela Williams, female    DOB: January 09, 1996, 23 y.o.   MRN: [id number]  01/06/2018  Dizziness (nausea,migraines, fainting, vomiting  x25month ) and Depression (screening was a 93 )    HPI This 22 y.o. female presents for evaluation of near-syncope with dizziness, migraines for the past month.   Working at desk all day; exposed to a lot of blue retina.  Causing migraines; developing nausea, dizziness, near-fainting sensations. Light sensitivity at baseline. Also recently had a change in psychotropic medications; increased dose of both medications.  Increased Abilify and Lamictal recently.  Has only increased Abilify dose at this time. Also initiated on OCP one month ago close to when current symptoms started. Has been at this job for this year.   Feels like receiving negative side effect to prolonged screen times. Really likes job; looks forward to work.   Chronic migraines; usually to light sensitivity; a lot worse recently due to new job.  Tries to move around; brings dumbbells at desk. Trying to lose weight.   No neurologist now; history of seizures in high school.  Maintained on medication.   Migraines R sided and radiate periorbital region; develop nausea; develops posterior head; never had occipital headaches; slurred speech when the side of migraines.  Dizziness also occurs; blurred vision.  Photosensitivity.   Phonophobia none.  Vomiting occurs with nausea; vomiting makes headache intense.   Mom is a Marine scientist.  Balance is completely thrown off; balance is off; memory is intact.    Mondays are days off. Going for masters. No syncope. Aware of lightheadedness and near-fainting events; never loses consciousness.   No diplopia. Has suffered with 10 migraines in the past month.  Mostly occurs at work.  Only twice it has happened at home. Increased Abilify last week.   Three near fainting episodes: one in bathroom and slid down against wall; another occurred  where head hit keyboard, third episode occurred at home. No staring spells.  All three near fainting spells witnessed; no seizure activity witnessed; no incontinence with episodes; no concerns with recurrent seizure activity. Wears glasses; no recent eye exam. 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 Now excessive worry since working.  Has an infant who is almost one year old; has excessive worry over child yet did not tolerate staying at home.  Work is good; patient has been motivated professionally since having son. Now has a reason to improve self. Started OCP on 12/08/17. Chronic insomnia.    Last neurology consultation in high school.  Melatonin. Started OCP on 12/08/17 for irregular bleeding with Nexplanon. Now starting to exercise.   Watching [employer].    In May, did suffer with true syncope; ED evaluation.  No recurrence. Disorienting when came to.  Short loss of consciousness.      BP Readings from Last 3 Encounters:  01/06/18 112/70  12/07/17 120/78  11/07/17 121/76   Wt Readings from Last 3 Encounters:  01/06/18 150 lb 6.4 oz (68.2 kg)  12/07/17 146 lb 6.4 oz (66.4 kg)  07/29/17 122 lb 9.6 oz (55.6 kg)   Immunization History  Administered Date(s) Administered  . Tdap 02/02/2017    Review of Systems  Constitutional: Negative for activity change, appetite change, chills, diaphoresis, fatigue, fever and unexpected weight change.  HENT: Negative for congestion, dental problem, drooling, ear discharge, ear pain, facial swelling, hearing loss, mouth sores, nosebleeds, postnasal drip, rhinorrhea, sinus pressure, sneezing, sore throat, tinnitus, trouble swallowing and voice change.  Eyes: Positive for photophobia. Negative for pain, discharge, redness, itching and visual disturbance.  Respiratory: Negative for apnea, cough, choking, chest tightness, shortness of breath, wheezing and stridor.   Cardiovascular: Negative for chest pain, palpitations and leg swelling.   Gastrointestinal: Positive for nausea and vomiting. Negative for abdominal distention, abdominal pain, anal bleeding, blood in stool, constipation, diarrhea and rectal pain.  Endocrine: Negative for cold intolerance, heat intolerance, polydipsia, polyphagia and polyuria.  Genitourinary: Positive for frequency and urgency. Negative for decreased urine volume, difficulty urinating, dyspareunia, dysuria, enuresis, flank pain, genital sores, hematuria, menstrual problem, pelvic pain, vaginal bleeding, vaginal discharge and vaginal pain.  Musculoskeletal: Negative for arthralgias, back pain, gait problem, joint swelling, myalgias, neck pain and neck stiffness.  Skin: Negative for color change, pallor, rash and wound.  Allergic/Immunologic: Negative for environmental allergies, food allergies and immunocompromised state.  Neurological: Positive for dizziness, light-headedness and headaches. Negative for tremors, seizures, syncope, facial asymmetry, speech difficulty, weakness and numbness.  Hematological: Negative for adenopathy. Does not bruise/bleed easily.  Psychiatric/Behavioral: Positive for sleep disturbance. Negative for agitation, behavioral problems, confusion, decreased concentration, dysphoric mood, hallucinations, self-injury and suicidal ideas. The patient is nervous/anxious. The patient is not hyperactive.     Past Medical History:  Diagnosis Date  . Anemia   . Asthma   . breast ca dx'd 05/2017   left  . Depression    no meds in 3 yrs  . Headache   . Infection    UTI  . Seizures ([place]) 2014   "seizure like activity", reaction to medicaiton, none since   Past Surgical History:  Procedure Laterality Date  . ESOPHAGOGASTRODUODENOSCOPY (EGD) WITH PROPOFOL N/A 06/03/2017   Procedure: ESOPHAGOGASTRODUODENOSCOPY (EGD) WITH PROPOFOL;  Surgeon: Ladene Artist, MD;  Location: [facility]COPY;  Service: Endoscopy;  Laterality: N/A;  . NO PAST SURGERIES     Allergies  Allergen Reactions    . Lexapro [Escitalopram] Other (See Comments)    Didn't like how it made her feel  . Lithium Other (See Comments)    SEIZURES, Childhood reaction   Current Outpatient Medications on File Prior to Visit  Medication Sig Dispense Refill  . ARIPiprazole (ABILIFY) 10 MG tablet Take 1 tablet (10 mg total) by mouth daily. 30 tablet 0  . Etonogestrel (NEXPLANON [place]) Inject 1 application into the skin. Currently implanted    . lamoTRIgine (LAMICTAL) 25 MG tablet Take 1 tablet (25 mg total) by mouth daily. 30 tablet 0  . Norethindrone-Ethinyl Estradiol-Fe Biphas (LO LOESTRIN FE) 1 MG-10 MCG / 10 MCG tablet Take 1 tablet by mouth daily. Take 1 tablet by mouth daily. 3 Package 4   No current facility-administered medications on file prior to visit.    Social History   Socioeconomic History  . Marital status: Single    Spouse name: Not on file  . Number of children: Not on file  . Years of education: Not on file  . Highest education level: Not on file  Occupational History  . Not on file  Social Needs  . Financial resource strain: Not on file  . Food insecurity:    Worry: Not on file    Inability: Not on file  . Transportation needs:    Medical: Not on file    Non-medical: Not on file  Tobacco Use  . Smoking status: Former Smoker    Types: Cigarettes    Last attempt to quit: 12/19/2014    Years since quitting: 3.0  . Smokeless tobacco: Never Used  .  Tobacco comment: age 79  Substance and Sexual Activity  . Alcohol use: Yes    Comment: occasionally  . Drug use: No  . Sexual activity: Not Currently  Lifestyle  . Physical activity:    Days per week: Not on file    Minutes per session: Not on file  . Stress: Not on file  Relationships  . Social connections:    Talks on phone: Not on file    Gets together: Not on file    Attends religious service: Not on file    Active member of club or organization: Not on file    Attends meetings of clubs or organizations: Not on file     Relationship status: Not on file  . Intimate partner violence:    Fear of current or ex partner: Not on file    Emotionally abused: Not on file    Physically abused: Not on file    Forced sexual activity: Not on file  Other Topics Concern  . Not on file  Social History Narrative  . Not on file   Family History  Adopted: Yes  Problem Relation Age of Onset  . Mental illness Mother   . Cancer Mother   . Breast cancer Mother   . Cancer Maternal Grandmother   . Hypertension Maternal Grandmother   . Diabetes Maternal Grandmother   . Cancer Maternal Aunt   . Breast cancer Maternal Aunt   . Breast cancer Sister   . Breast cancer Maternal Aunt   . Breast cancer Maternal Aunt   . Breast cancer Maternal Aunt   . Breast cancer Maternal Aunt        Objective:    BP 112/70   Pulse (!) 104   Temp 98.6 F (37 C) (Oral)   Resp 18   Ht 5\' 1"  (1.549 m)   Wt 150 lb 6.4 oz (68.2 kg)   SpO2 99%   BMI 28.42 kg/m  Physical Exam  Constitutional: She is oriented to person, place, and time. She appears well-developed and well-nourished. No distress.  HENT:  Head: Normocephalic and atraumatic.  Right Ear: External ear normal.  Left Ear: External ear normal.  Nose: Nose normal.  Mouth/Throat: Oropharynx is clear and moist.  Eyes: Pupils are equal, round, and reactive to light. Conjunctivae and EOM are normal.  Neck: Normal range of motion and full passive range of motion without pain. Neck supple. No JVD present. Carotid bruit is not present. No thyromegaly present.  Cardiovascular: Normal rate, regular rhythm and normal heart sounds. Exam reveals no gallop and no friction rub.  No murmur heard. Pulmonary/Chest: Effort normal and breath sounds normal. She has no wheezes. She has no rales.  Abdominal: Soft. Bowel sounds are normal. She exhibits no distension and no mass. There is no tenderness. There is no rebound and no guarding.  Musculoskeletal:       Right shoulder: Normal.       Left  shoulder: Normal.       Cervical back: Normal.  Lymphadenopathy:    She has no cervical adenopathy.  Neurological: She is alert and oriented to person, place, and time. She has normal reflexes. She displays normal reflexes. No cranial nerve deficit or sensory deficit. She exhibits normal muscle tone. Coordination normal.  Skin: Skin is warm and dry. No rash noted. She is not diaphoretic. No erythema. No pallor.  Psychiatric: She has a normal mood and affect. Her behavior is normal. Judgment and thought content normal.  Nursing  note and vitals reviewed.  No results found. Depression screen [facility] 2/9 01/06/2018 07/29/2017 05/14/2017 06/12/2016  Decreased Interest 3 0 3 0  Down, Depressed, Hopeless 1 0 2 0  PHQ - 2 Score 4 0 5 0  Altered sleeping 3 - 3 -  Tired, decreased energy 3 - 3 -  Change in appetite 3 - 3 -  Feeling bad or failure about yourself  3 - 2 -  Trouble concentrating 3 - 0 -  Moving slowly or fidgety/restless 3 - 3 -  Suicidal thoughts 1 - 0 -  PHQ-9 Score 23 - 19 -  Difficult doing work/chores - - Somewhat difficult -   Fall Risk  01/06/2018 07/29/2017 05/14/2017 08/13/2016 06/12/2016  Falls in the past year? No No No No No        Assessment & Plan:   1. Near syncope   2. Dizziness   3. Migraine without aura and without status migrainosus, not intractable   4. Urine frequency   5. Bipolar II disorder major depressive with postpartum onset ([place])   6. Abnormal vaginal bleeding     New onset near syncope with dizziness associated with worsening migraines: Normal neurological exam in office.  Obtain labs to rule out secondary causes.  Chronic migraines.  Recent initiation of OCP for irregular vaginal bleeding.  Recommend contacting gynecologist to hold OCP; most likely etiology to worsening migraines.  Migraines: Worsening.  Associated with dizziness.  Increased frequency lately.  Mood instability in the recent several months due to return to work and also having an infant.   OCP also initiated in the past month.  Recommend stopping OCP.  Refer to neurology if migraines do not improve with holding OCP.  Prescription for Maxalt provided to use with migraines.  Patient declined antiemetic however he is happy to call for prescription for Zofran.  Urinary frequency: New.  Send urine culture.  Augmentin sent into pharmacy due to positive urine culture on 01/06/2018.  Bipolar 2 disorder: Uncontrolled with recent increase in Abilify and Lamictal doses.  Recommend close follow-up with psychiatry.  Emotionally stable during visit today.  Abnormal vaginal bleeding: On Nexplanon.  Gynecology recently added OCP which may be triggering migraines.  Orders Placed This Encounter  Procedures  . Urine Culture    Order Specific Question:   Source    Answer:   clean catch  . CBC with Differential/Platelet  . Comprehensive metabolic panel  . TSH  . Ambulatory referral to Neurology    Referral Priority:   Routine    Referral Type:   Consultation    Referral Reason:   Specialty Services Required    Requested Specialty:   Neurology    Number of Visits Requested:   1  . POCT urinalysis dipstick  . POCT urine pregnancy   Meds ordered this encounter  Medications  . rizatriptan (MAXALT-MLT) 10 MG disintegrating tablet    Sig: Take 1 tablet (10 mg total) by mouth once for 1 dose. May repeat in 2 hours if needed    Dispense:  8 tablet    Refill:  3  . amoxicillin-clavulanate (AUGMENTIN) 875-125 MG tablet    Sig: Take 1 tablet by mouth 2 (two) times daily.    Dispense:  14 tablet    Refill:  0    Return in about 3 weeks (around 01/27/2018) for recheck.   Llewelyn Sheaffer Elayne Guerin, M.D. Primary Care at [facility] previously [facility] [address],  [place]  [phone] phone [phone] fax

## 2018-01-07 LAB — CBC WITH DIFFERENTIAL/PLATELET
BASOS ABS: 0 10*3/uL (ref 0.0–0.2)
Basos: 0 %
EOS (ABSOLUTE): 0.1 10*3/uL (ref 0.0–0.4)
EOS: 1 %
HEMATOCRIT: 40.5 % (ref 34.0–46.6)
HEMOGLOBIN: 13.3 g/dL (ref 11.1–15.9)
IMMATURE GRANULOCYTES: 0 %
Immature Grans (Abs): 0 10*3/uL (ref 0.0–0.1)
Lymphocytes Absolute: 1.7 10*3/uL (ref 0.7–3.1)
Lymphs: 19 %
MCH: 29.7 pg (ref 26.6–33.0)
MCHC: 32.8 g/dL (ref 31.5–35.7)
MCV: 90 fL (ref 79–97)
Monocytes Absolute: 0.7 10*3/uL (ref 0.1–0.9)
Monocytes: 7 %
NEUTROS PCT: 73 %
Neutrophils Absolute: 6.5 10*3/uL (ref 1.4–7.0)
Platelets: 351 10*3/uL (ref 150–450)
RBC: 4.48 x10E6/uL (ref 3.77–5.28)
RDW: 13.9 % (ref 12.3–15.4)
WBC: 9 10*3/uL (ref 3.4–10.8)

## 2018-01-07 LAB — COMPREHENSIVE METABOLIC PANEL
ALBUMIN: 4.6 g/dL (ref 3.5–5.5)
ALK PHOS: 88 IU/L (ref 39–117)
ALT: 11 IU/L (ref 0–32)
AST: 14 IU/L (ref 0–40)
Albumin/Globulin Ratio: 1.5 (ref 1.2–2.2)
BUN/Creatinine Ratio: 13 (ref 9–23)
BUN: 11 mg/dL (ref 6–20)
Bilirubin Total: 0.9 mg/dL (ref 0.0–1.2)
CALCIUM: 9.5 mg/dL (ref 8.7–10.2)
CO2: 23 mmol/L (ref 20–29)
Chloride: 105 mmol/L (ref 96–106)
Creatinine, Ser: 0.84 mg/dL (ref 0.57–1.00)
GFR calc Af Amer: 113 mL/min/{1.73_m2} (ref >59)
GFR, EST NON AFRICAN AMERICAN: 98 mL/min/{1.73_m2} (ref >59)
GLOBULIN, TOTAL: 3.1 g/dL (ref 1.5–4.5)
GLUCOSE: 102 mg/dL — AB (ref 65–99)
Potassium: 4.1 mmol/L (ref 3.5–5.2)
SODIUM: 142 mmol/L (ref 134–144)
Total Protein: 7.7 g/dL (ref 6.0–8.5)

## 2018-01-07 LAB — TSH: TSH: 1.35 u[IU]/mL (ref 0.450–4.500)

## 2018-01-08 LAB — URINE CULTURE

## 2018-01-09 ENCOUNTER — Encounter: Payer: Self-pay | Admitting: Family Medicine

## 2018-01-09 MED ORDER — AMOXICILLIN-POT CLAVULANATE 875-125 MG PO TABS: 1.0000 | ORAL_TABLET | Freq: Two times a day (BID) | ORAL | 0 refills | Status: DC

## 2018-01-18 ENCOUNTER — Telehealth: Payer: Self-pay | Admitting: *Deleted

## 2018-01-18 NOTE — Telephone Encounter (Signed)
PA RIZATRIPTAN KEY: [id number]

## 2018-01-25 ENCOUNTER — Ambulatory Visit: Payer: 59 | Admitting: Family Medicine

## 2018-08-13 ENCOUNTER — Emergency Department (HOSPITAL_COMMUNITY)
Admission: EM | Admit: 2018-08-13 | Discharge: 2018-08-13 | Disposition: A | Discharge status: Home / Self Care | Payer: Medicaid Other | Attending: Emergency Medicine | Admitting: Emergency Medicine

## 2018-08-13 ENCOUNTER — Other Ambulatory Visit: Payer: Self-pay

## 2018-08-13 DIAGNOSIS — Z9221 Personal history of antineoplastic chemotherapy: Secondary | ICD-10-CM | POA: Insufficient documentation

## 2018-08-13 DIAGNOSIS — Z87891 Personal history of nicotine dependence: Secondary | ICD-10-CM | POA: Insufficient documentation

## 2018-08-13 DIAGNOSIS — R0902 Hypoxemia: Secondary | ICD-10-CM | POA: Diagnosis not present

## 2018-08-13 DIAGNOSIS — Z853 Personal history of malignant neoplasm of breast: Secondary | ICD-10-CM | POA: Diagnosis not present

## 2018-08-13 DIAGNOSIS — J45909 Unspecified asthma, uncomplicated: Secondary | ICD-10-CM | POA: Insufficient documentation

## 2018-08-13 DIAGNOSIS — R000 Tachycardia, unspecified: Secondary | ICD-10-CM | POA: Diagnosis not present

## 2018-08-13 DIAGNOSIS — R1084 Generalized abdominal pain: Secondary | ICD-10-CM | POA: Diagnosis not present

## 2018-08-13 DIAGNOSIS — Z79899 Other long term (current) drug therapy: Secondary | ICD-10-CM | POA: Diagnosis not present

## 2018-08-13 DIAGNOSIS — R55 Syncope and collapse: Secondary | ICD-10-CM | POA: Diagnosis present

## 2018-08-13 DIAGNOSIS — R112 Nausea with vomiting, unspecified: Secondary | ICD-10-CM

## 2018-08-13 LAB — COMPREHENSIVE METABOLIC PANEL
ALT: 14 U/L (ref 0–44)
ANION GAP: 8 (ref 5–15)
AST: 14 U/L — ABNORMAL LOW (ref 15–41)
Albumin: 4.8 g/dL (ref 3.5–5.0)
Alkaline Phosphatase: 77 U/L (ref 38–126)
BUN: 13 mg/dL (ref 6–20)
CO2: 23 mmol/L (ref 22–32)
Calcium: 9.4 mg/dL (ref 8.9–10.3)
Chloride: 106 mmol/L (ref 98–111)
Creatinine, Ser: 0.7 mg/dL (ref 0.44–1.00)
GFR calc Af Amer: >60 mL/min (ref >60)
Glucose, Bld: 106 mg/dL — ABNORMAL HIGH (ref 70–99)
Potassium: 3.8 mmol/L (ref 3.5–5.1)
Sodium: 137 mmol/L (ref 135–145)
Total Bilirubin: 1.5 mg/dL — ABNORMAL HIGH (ref 0.3–1.2)
Total Protein: 8.3 g/dL — ABNORMAL HIGH (ref 6.5–8.1)

## 2018-08-13 LAB — URINALYSIS, ROUTINE W REFLEX MICROSCOPIC
Bilirubin Urine: NEGATIVE
GLUCOSE, UA: NEGATIVE mg/dL
Ketones, ur: 20 mg/dL — AB
Nitrite: POSITIVE — AB
Protein, ur: NEGATIVE mg/dL
Specific Gravity, Urine: 1.02 (ref 1.005–1.030)
pH: 5 (ref 5.0–8.0)

## 2018-08-13 LAB — CBC
HCT: 44.5 % (ref 36.0–46.0)
Hemoglobin: 14.5 g/dL (ref 12.0–15.0)
MCH: 30.9 pg (ref 26.0–34.0)
MCHC: 32.6 g/dL (ref 30.0–36.0)
MCV: 94.7 fL (ref 80.0–100.0)
Platelets: 249 10*3/uL (ref 150–400)
RBC: 4.7 MIL/uL (ref 3.87–5.11)
RDW: 12.5 % (ref 11.5–15.5)
WBC: 18.7 10*3/uL — AB (ref 4.0–10.5)
nRBC: 0 % (ref 0.0–0.2)

## 2018-08-13 LAB — LIPASE, BLOOD: Lipase: 28 U/L (ref 11–51)

## 2018-08-13 LAB — I-STAT BETA HCG BLOOD, ED (MC, WL, AP ONLY): I-stat hCG, quantitative: <5 m[IU]/mL (ref <5)

## 2018-08-13 MED ORDER — SODIUM CHLORIDE 0.9% FLUSH
3.0000 mL | Freq: Once | INTRAVENOUS | Status: AC
  Administered 2018-08-13: 3 mL via INTRAVENOUS

## 2018-08-13 MED ORDER — SODIUM CHLORIDE 0.9 % IV BOLUS
1000.0000 mL | Freq: Once | INTRAVENOUS | Status: AC
  Administered 2018-08-13: 1000 mL via INTRAVENOUS

## 2018-08-13 MED ORDER — ONDANSETRON HCL 8 MG PO TABS: 8.0000 mg | ORAL_TABLET | Freq: Three times a day (TID) | ORAL | 0 refills | Status: DC | PRN

## 2018-08-13 MED ORDER — ONDANSETRON HCL 4 MG/2ML IJ SOLN
4.0000 mg | Freq: Once | INTRAMUSCULAR | Status: AC | PRN
  Administered 2018-08-13: 4 mg via INTRAVENOUS
  Filled 2018-08-13: qty 2

## 2018-08-13 NOTE — ED Notes (Addendum)
Pt stood to attempt to ambulate to the restroom. Pt stated she thought she stood too quickly bc she felt nauseous - pt proceeded to sit. Pt then ambulated to the restroom with minimal assist. No other issues observed.

## 2018-08-13 NOTE — ED Notes (Signed)
Bed: YY50 Expected date:  Expected time:  Means of arrival:  Comments: EMS n/v- chemo yesterday
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
## 2018-08-13 NOTE — ED Notes (Signed)
Family at bedside. 

## 2018-08-13 NOTE — ED Notes (Signed)
EKG stored PRN.

## 2018-08-13 NOTE — ED Notes (Signed)
Pt provided water per MD request.

## 2018-08-13 NOTE — ED Notes (Signed)
Pt tolerating PO intake

## 2018-08-13 NOTE — Discharge Instructions (Signed)
Try to drink plenty of fluids start with clear liquids and gradually advance your diet to regular foods.  Follow-up with your doctor as needed for problems.

## 2018-08-13 NOTE — ED Triage Notes (Signed)
Patient BIB EMS from work at [employer]. Patient has current history of left breast cancer. Patient had chemo yesterday and reports often having nausea after treatments. Patient states today she felt as thought she would "pass out" today while feeling her usual nausea at work, but did not lose consciousness. Patient AxOx4. Patient tachycardic in triage HR 110, BP 109/86.

## 2018-11-23 ENCOUNTER — Telehealth: Payer: Self-pay | Admitting: Family Medicine

## 2018-11-23 NOTE — Telephone Encounter (Signed)
Called patient to see if she still wanted to reschedule appt with Dr. Pamella Pert per [place]

## 2018-11-23 NOTE — Telephone Encounter (Signed)
Patient's Mother is calling to see if the appt could be earlier or later. Please advise CB- [phone]

## 2018-11-24 ENCOUNTER — Ambulatory Visit: Payer: 59 | Admitting: Family Medicine

## 2018-11-24 ENCOUNTER — Other Ambulatory Visit: Payer: Self-pay

## 2018-12-07 ENCOUNTER — Encounter: Payer: Self-pay | Admitting: Family Medicine

## 2018-12-07 ENCOUNTER — Ambulatory Visit (INDEPENDENT_AMBULATORY_CARE_PROVIDER_SITE_OTHER): Payer: BC Managed Care – PPO | Admitting: Family Medicine

## 2018-12-07 ENCOUNTER — Other Ambulatory Visit: Payer: Self-pay

## 2018-12-07 VITALS — BP 110/62 | HR 84 | Temp 98.4°F | Resp 18

## 2018-12-07 DIAGNOSIS — Z3009 Encounter for other general counseling and advice on contraception: Secondary | ICD-10-CM

## 2018-12-07 DIAGNOSIS — R42 Dizziness and giddiness: Secondary | ICD-10-CM | POA: Diagnosis not present

## 2018-12-07 DIAGNOSIS — R5383 Other fatigue: Secondary | ICD-10-CM

## 2018-12-07 DIAGNOSIS — D500 Iron deficiency anemia secondary to blood loss (chronic): Secondary | ICD-10-CM | POA: Diagnosis not present

## 2018-12-07 DIAGNOSIS — Z975 Presence of (intrauterine) contraceptive device: Secondary | ICD-10-CM

## 2018-12-07 DIAGNOSIS — F3163 Bipolar disorder, current episode mixed, severe, without psychotic features: Secondary | ICD-10-CM | POA: Diagnosis not present

## 2018-12-07 MED ORDER — NORGESTIMATE-ETH ESTRADIOL 0.25-35 MG-MCG PO TABS: 1.0000 | ORAL_TABLET | Freq: Every day | ORAL | 0 refills | Status: DC

## 2018-12-07 MED ORDER — ARIPIPRAZOLE 10 MG PO TABS: 10.0000 mg | ORAL_TABLET | Freq: Every day | ORAL | 0 refills | Status: DC
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
## 2018-12-07 NOTE — Patient Instructions (Signed)
Behavioral Health Resources  Call our 24-hour HelpLine at (718) [phone] or [phone] for immediate assistance for mental health and substance abuse issues. Or walk in to [facility] or the emergency department at [facility] for a prompt in-person crisis assessment. Additional Resources [employer] Network: 1-800-SUICIDE  The [employer] Suicide Prevention Lifeline: 1-800-273-TALK   Dr. Noland Fordy[facility]  [place], [place], [place] [address]  [phone]

## 2018-12-08 ENCOUNTER — Telehealth: Payer: Self-pay | Admitting: Family Medicine

## 2018-12-08 LAB — VITAMIN D 25 HYDROXY (VIT D DEFICIENCY, FRACTURES): Vit D, 25-Hydroxy: 21.1 ng/mL — ABNORMAL LOW (ref 30.0–100.0)

## 2018-12-08 LAB — CBC
Hematocrit: 40.3 % (ref 34.0–46.6)
Hemoglobin: 13.8 g/dL (ref 11.1–15.9)
MCH: 30.9 pg (ref 26.6–33.0)
MCHC: 34.2 g/dL (ref 31.5–35.7)
MCV: 90 fL (ref 79–97)
Platelets: 268 10*3/uL (ref 150–450)
RBC: 4.46 x10E6/uL (ref 3.77–5.28)
RDW: 12.6 % (ref 11.7–15.4)
WBC: 8 10*3/uL (ref 3.4–10.8)

## 2018-12-08 LAB — TSH: TSH: 1.65 u[IU]/mL (ref 0.450–4.500)

## 2018-12-08 NOTE — Telephone Encounter (Signed)
Sent provider a message about medication needling to be resent to pharmacy.

## 2018-12-08 NOTE — Telephone Encounter (Signed)
Copied from [place] [id number]. Topic: Quick Communication - Rx Refill/Question >> Dec 08, 2018  1:26 PM Sheran Luz wrote: Medication: ARIPiprazole (ABILIFY) 10 MG tablet   Patient states pharmacy never received RX. Please resend.    Preferred Pharmacy (with phone number or street name):[facility] ([address]), [place] [phone] (Phone) [phone] (Fax)

## 2018-12-09 ENCOUNTER — Other Ambulatory Visit: Payer: Self-pay | Admitting: Family Medicine

## 2018-12-09 ENCOUNTER — Telehealth: Payer: Self-pay | Admitting: Family Medicine

## 2018-12-09 MED ORDER — ARIPIPRAZOLE 10 MG PO TABS: 10.0000 mg | ORAL_TABLET | Freq: Every day | ORAL | 0 refills | Status: DC

## 2018-12-09 NOTE — Telephone Encounter (Signed)
Copied from [place] [id number]. Topic: Quick Communication - Rx Refill/Question >> Dec 08, 2018  4:55 PM Izola Price, Nate A wrote: Medication: ARIPiprazole (ABILIFY) 10 MG tablet, norgestimate-ethinyl estradiol (SPRINTEC 28) 0.25-35 MG-MCG tablet   Has the patient contacted their pharmacy? Yes (Agent: If no, request that the patient contact the pharmacy for the refill.) (Agent: If yes, when and what did the pharmacy advise?)Contact PCP  Preferred Pharmacy (with phone number or street name): [facility], [place], [place] [address] [phone]  Agent: Please be advised that RX refills may take up to 3 business days. We ask that you follow-up with your pharmacy.

## 2018-12-09 NOTE — Telephone Encounter (Signed)
Both medication were filled. Attempted to call pt to let her know, however her voicemail box is full so unable to leave message

## 2018-12-13 ENCOUNTER — Ambulatory Visit: Payer: Self-pay | Admitting: Family Medicine

## 2018-12-18 ENCOUNTER — Encounter: Payer: Self-pay | Admitting: Family Medicine

## 2018-12-22 ENCOUNTER — Ambulatory Visit (INDEPENDENT_AMBULATORY_CARE_PROVIDER_SITE_OTHER): Payer: BC Managed Care – PPO | Admitting: Family Medicine

## 2018-12-22 ENCOUNTER — Other Ambulatory Visit: Payer: Self-pay

## 2018-12-22 VITALS — BP 105/72 | HR 90 | Temp 98.7°F | Resp 17 | Ht 61.0 in | Wt 141.6 lb

## 2018-12-22 DIAGNOSIS — Z3046 Encounter for surveillance of implantable subdermal contraceptive: Secondary | ICD-10-CM

## 2018-12-22 DIAGNOSIS — Z3009 Encounter for other general counseling and advice on contraception: Secondary | ICD-10-CM | POA: Diagnosis not present

## 2018-12-22 DIAGNOSIS — Z30432 Encounter for removal of intrauterine contraceptive device: Secondary | ICD-10-CM

## 2018-12-22 DIAGNOSIS — F314 Bipolar disorder, current episode depressed, severe, without psychotic features: Secondary | ICD-10-CM | POA: Diagnosis not present

## 2018-12-22 MED ORDER — XULANE 150-35 MCG/24HR TD PTWK: 1.0000 | MEDICATED_PATCH | TRANSDERMAL | 12 refills | Status: DC

## 2018-12-22 NOTE — Progress Notes (Signed)
Established Patient Office Visit  Subjective:  Patient ID: Michaela Williams, female    DOB: June 18, 1996  Age: 23 y.o. MRN: [id number]  CC:  Chief Complaint  Patient presents with  . nexplanon removal  . Depression    score: 22    HPI Michaela Williams presents for   Patient reports that she is dealing with depression and wish she had never existed but does not want to kill herself because of her son so now she feels like she is already hear so it is is too late to do anything She was started on Abilify and feels tired all the time and feels delirious. She knows that the abilify helps her to  She took seroquel in the past but it made her feel out of sorts She has also tried trazodone She would like to stick to melatonin but does not want to sleep too heavy because her son has nightmare She states that she is introverted but the lack of sleep makes her very mood and irritable  Nexplanon in place September 2018 She would like to get the nexplanon removed and started on the xulane patch She denies pregnancy concerns     Past Medical History:  Diagnosis Date  . Anemia   . Asthma   . breast ca dx'd 05/2017   left  . Depression    no meds in 3 yrs  . Headache   . Infection    UTI  . Seizures ([place]) 2014   "seizure like activity", reaction to medicaiton, none since    Past Surgical History:  Procedure Laterality Date  . ESOPHAGOGASTRODUODENOSCOPY (EGD) WITH PROPOFOL N/A 06/03/2017   Procedure: ESOPHAGOGASTRODUODENOSCOPY (EGD) WITH PROPOFOL;  Surgeon: Ladene Artist, MD;  Location: [facility];  Service: Endoscopy;  Laterality: N/A;  . NO PAST SURGERIES      Family History  Adopted: Yes  Problem Relation Age of Onset  . Mental illness Mother   . Cancer Mother   . Breast cancer Mother   . Cancer Maternal Grandmother   . Hypertension Maternal Grandmother   . Diabetes Maternal Grandmother   . Cancer Maternal Aunt   . Breast cancer Maternal Aunt   . Breast cancer Sister    . Breast cancer Maternal Aunt   . Breast cancer Maternal Aunt   . Breast cancer Maternal Aunt   . Breast cancer Maternal Aunt     Social History   Socioeconomic History  . Marital status: Single    Spouse name: Not on file  . Number of children: Not on file  . Years of education: Not on file  . Highest education level: Not on file  Occupational History  . Not on file  Social Needs  . Financial resource strain: Not on file  . Food insecurity    Worry: Not on file    Inability: Not on file  . Transportation needs    Medical: Not on file    Non-medical: Not on file  Tobacco Use  . Smoking status: Former Smoker    Types: Cigarettes    Quit date: 12/19/2014    Years since quitting: 4.0  . Smokeless tobacco: Never Used  . Tobacco comment: age 64  Substance and Sexual Activity  . Alcohol use: Yes    Comment: occasionally  . Drug use: No  . Sexual activity: Not Currently  Lifestyle  . Physical activity    Days per week: Not on file    Minutes per session: Not  on file  . Stress: Not on file  Relationships  . Social Herbalist on phone: Not on file    Gets together: Not on file    Attends religious service: Not on file    Active member of club or organization: Not on file    Attends meetings of clubs or organizations: Not on file    Relationship status: Not on file  . Intimate partner violence    Fear of current or ex partner: Not on file    Emotionally abused: Not on file    Physically abused: Not on file    Forced sexual activity: Not on file  Other Topics Concern  . Not on file  Social History Narrative  . Not on file    Outpatient Medications Prior to Visit  Medication Sig Dispense Refill  . ARIPiprazole (ABILIFY) 10 MG tablet Take 1 tablet (10 mg total) by mouth daily. 90 tablet 0  . Etonogestrel (NEXPLANON [place]) Inject 1 application into the skin. Currently implanted    . norgestimate-ethinyl estradiol (SPRINTEC 28) 0.25-35 MG-MCG tablet Take 1  tablet by mouth daily. (Patient not taking: Reported on 12/22/2018) 1 Package 0  . ondansetron (ZOFRAN) 8 MG tablet Take 1 tablet (8 mg total) by mouth every 8 (eight) hours as needed for nausea or vomiting. (Patient not taking: Reported on 12/22/2018) 20 tablet 0   No facility-administered medications prior to visit.     Allergies  Allergen Reactions  . Lexapro [Escitalopram] Other (See Comments)    Didn't like how it made her feel  . Lithium Other (See Comments)    SEIZURES, Childhood reaction    ROS Review of Systems Review of Systems  Constitutional: Negative for activity change, appetite change, chills and fever.  HENT: Negative for congestion, nosebleeds, trouble swallowing and voice change.   Respiratory: Negative for cough, shortness of breath and wheezing.   Gastrointestinal: Negative for diarrhea, nausea and vomiting.  Genitourinary: Negative for difficulty urinating, dysuria, flank pain and hematuria.  Musculoskeletal: Negative for back pain, joint swelling and neck pain.  Neurological: Negative for dizziness, speech difficulty, light-headedness and numbness.  See HPI. All other review of systems negative.     Objective:    Physical Exam  Constitutional: She appears well-developed and well-nourished.  HENT:  Head: Normocephalic and atraumatic.  Eyes: Conjunctivae and EOM are normal.  Pulmonary/Chest: Effort normal.  Psychiatric: She has a normal mood and affect. Her behavior is normal. Judgment and thought content normal.    BP 105/72 (BP Location: Right Arm, Patient Position: Sitting, Cuff Size: Normal)   Pulse 90   Temp 98.7 F (37.1 C) (Oral)   Resp 17   Ht 5\' 1"  (1.549 m)   Wt 141 lb 9.6 oz (64.2 kg)   LMP 10/04/2018 Comment: menses started late March and lasted into late May 2020  SpO2 97%   BMI 26.76 kg/m  Wt Readings from Last 3 Encounters:  12/22/18 141 lb 9.6 oz (64.2 kg)  01/06/18 150 lb 6.4 oz (68.2 kg)  12/07/17 146 lb 6.4 oz (66.4 kg)      There are no preventive care reminders to display for this patient.  There are no preventive care reminders to display for this patient.  Lab Results  Component Value Date   TSH 1.650 12/07/2018   Lab Results  Component Value Date   WBC 8.0 12/07/2018   HGB 13.8 12/07/2018   HCT 40.3 12/07/2018   MCV 90 12/07/2018  PLT 268 12/07/2018   Lab Results  Component Value Date   NA 137 08/13/2018   K 3.8 08/13/2018   CO2 23 08/13/2018   GLUCOSE 106 (H) 08/13/2018   BUN 13 08/13/2018   CREATININE 0.70 08/13/2018   BILITOT 1.5 (H) 08/13/2018   ALKPHOS 77 08/13/2018   AST 14 (L) 08/13/2018   ALT 14 08/13/2018   PROT 8.3 (H) 08/13/2018   ALBUMIN 4.8 08/13/2018   CALCIUM 9.4 08/13/2018   ANIONGAP 8 08/13/2018   Lab Results  Component Value Date   CHOL 196 07/03/2017   Lab Results  Component Value Date   HDL 63 07/03/2017   Lab Results  Component Value Date   LDLCALC 124 (H) 07/03/2017   Lab Results  Component Value Date   TRIG 45 07/03/2017   Lab Results  Component Value Date   CHOLHDL 3.1 07/03/2017   Lab Results  Component Value Date   HGBA1C 5.0 07/03/2017      Assessment & Plan:   Problem List Items Addressed This Visit    None    Visit Diagnoses    Bipolar disorder, current episode depressed, severe, without psychotic features ([place])    -  Primary Referral to Dr. Toy Care for Bipolar disorder    Relevant Orders   Ambulatory referral to Psychiatry   Encounter for Nexplanon removal    - nexplanon removal done without complication    Contraception counseling -  Started on Xulane     Meds ordered this encounter  Medications  . norelgestromin-ethinyl estradiol Marilu Favre) 150-35 MCG/24HR transdermal patch    Sig: Place 1 patch onto the skin once a week.    Dispense:  3 patch    Refill:  12    Follow-up: Return if symptoms worsen or fail to improve.    Forrest Moron, MD

## 2018-12-22 NOTE — Patient Instructions (Addendum)
If you have lab work done today you will be contacted with your lab results within the next 2 weeks.  If you have not heard from [place] then please contact us. The fastest way to get your results is to register for My Chart.   IF you received an x-ray today, you will receive an invoice from [facility]. Please contact [facility] at [phone] with questions or concerns regarding your invoice.   IF you received labwork today, you will receive an invoice from [facility]. Please contact [facility] at [phone] with questions or concerns regarding your invoice.   Our billing staff will not be able to assist you with questions regarding bills from these companies.  You will be contacted with the lab results as soon as they are available. The fastest way to get your results is to activate your My Chart account. Instructions are located on the last page of this paperwork. If you have not heard from [place] regarding the results in 2 weeks, please contact this office.     Contraception Choices Contraception, also called birth control, refers to methods or devices that prevent pregnancy. Hormonal methods Contraceptive implant  A contraceptive implant is a thin, plastic tube that contains a hormone. It is inserted into the upper part of the arm. It can remain in place for up to 3 years. Progestin-only injections Progestin-only injections are injections of progestin, a synthetic form of the hormone progesterone. They are given every 3 months by a health care provider. Birth control pills  Birth control pills are pills that contain hormones that prevent pregnancy. They must be taken once a day, preferably at the same time each day. Birth control patch  The birth control patch contains hormones that prevent pregnancy. It is placed on the skin and must be changed once a week for three weeks and removed on the fourth week. A prescription is needed to use this method of  contraception. Vaginal ring  A vaginal ring contains hormones that prevent pregnancy. It is placed in the vagina for three weeks and removed on the fourth week. After that, the process is repeated with a new ring. A prescription is needed to use this method of contraception. Emergency contraceptive Emergency contraceptives prevent pregnancy after unprotected sex. They come in pill form and can be taken up to 5 days after sex. They work best the sooner they are taken after having sex. Most emergency contraceptives are available without a prescription. This method should not be used as your only form of birth control. Barrier methods Female condom  A female condom is a thin sheath that is worn over the penis during sex. Condoms keep sperm from going inside a woman's body. They can be used with a spermicide to increase their effectiveness. They should be disposed after a single use. Female condom  A female condom is a soft, loose-fitting sheath that is put into the vagina before sex. The condom keeps sperm from going inside a woman's body. They should be disposed after a single use. Diaphragm  A diaphragm is a soft, dome-shaped barrier. It is inserted into the vagina before sex, along with a spermicide. The diaphragm blocks sperm from entering the uterus, and the spermicide kills sperm. A diaphragm should be left in the vagina for 6-8 hours after sex and removed within 24 hours. A diaphragm is prescribed and fitted by a health care provider. A diaphragm should be replaced every 1-2 years, after giving birth, after gaining more than  15 lb (6.8 kg), and after pelvic surgery. Cervical cap  A cervical cap is a round, soft latex or plastic cup that fits over the cervix. It is inserted into the vagina before sex, along with spermicide. It blocks sperm from entering the uterus. The cap should be left in place for 6-8 hours after sex and removed within 48 hours. A cervical cap must be prescribed and fitted by a  health care provider. It should be replaced every 2 years. Sponge  A sponge is a soft, circular piece of polyurethane foam with spermicide on it. The sponge helps block sperm from entering the uterus, and the spermicide kills sperm. To use it, you make it wet and then insert it into the vagina. It should be inserted before sex, left in for at least 6 hours after sex, and removed and thrown away within 30 hours. Spermicides Spermicides are chemicals that kill or block sperm from entering the cervix and uterus. They can come as a cream, jelly, suppository, foam, or tablet. A spermicide should be inserted into the vagina with an applicator at least 25-05 minutes before sex to allow time for it to work. The process must be repeated every time you have sex. Spermicides do not require a prescription. Intrauterine contraception Intrauterine device (IUD) An IUD is a T-shaped device that is put in a woman's uterus. There are two types:  Hormone IUD.This type contains progestin, a synthetic form of the hormone progesterone. This type can stay in place for 3-5 years.  Copper IUD.This type is wrapped in copper wire. It can stay in place for 10 years.  Permanent methods of contraception Female tubal ligation In this method, a woman's fallopian tubes are sealed, tied, or blocked during surgery to prevent eggs from traveling to the uterus. Hysteroscopic sterilization In this method, a small, flexible insert is placed into each fallopian tube. The inserts cause scar tissue to form in the fallopian tubes and block them, so sperm cannot reach an egg. The procedure takes about 3 months to be effective. Another form of birth control must be used during those 3 months. Female sterilization This is a procedure to tie off the tubes that carry sperm (vasectomy). After the procedure, the man can still ejaculate fluid (semen). Natural planning methods Natural family planning In this method, a couple does not have sex on  days when the woman could become pregnant. Calendar method This means keeping track of the length of each menstrual cycle, identifying the days when pregnancy can happen, and not having sex on those days. Ovulation method In this method, a couple avoids sex during ovulation. Symptothermal method This method involves not having sex during ovulation. The woman typically checks for ovulation by watching changes in her temperature and in the consistency of cervical mucus. Post-ovulation method In this method, a couple waits to have sex until after ovulation. Summary  Contraception, also called birth control, means methods or devices that prevent pregnancy.  Hormonal methods of contraception include implants, injections, pills, patches, vaginal rings, and emergency contraceptives.  Barrier methods of contraception can include female condoms, female condoms, diaphragms, cervical caps, sponges, and spermicides.  There are two types of IUDs (intrauterine devices). An IUD can be put in a woman's uterus to prevent pregnancy for 3-5 years.  Permanent sterilization can be done through a procedure for males, females, or both.  Natural family planning methods involve not having sex on days when the woman could become pregnant. This information is not intended to  replace advice given to you by your health care provider. Make sure you discuss any questions you have with your health care provider. Document Released: 06/23/2005 Document Revised: 06/25/2017 Document Reviewed: 07/26/2016 Elsevier Interactive Patient Education  2019 [employer].

## 2018-12-22 NOTE — Progress Notes (Signed)
SENT MESSAGE TO PROVIDERS OFFICE WITH UPDATED TELEPHONE TO REACH PATIENT

## 2019-03-29 ENCOUNTER — Ambulatory Visit: Payer: BC Managed Care – PPO | Admitting: Family Medicine

## 2019-03-31 ENCOUNTER — Ambulatory Visit: Payer: BC Managed Care – PPO | Admitting: Family Medicine

## 2019-04-05 ENCOUNTER — Ambulatory Visit (INDEPENDENT_AMBULATORY_CARE_PROVIDER_SITE_OTHER): Payer: BC Managed Care – PPO | Admitting: Family Medicine

## 2019-04-05 ENCOUNTER — Other Ambulatory Visit: Payer: Self-pay

## 2019-04-05 ENCOUNTER — Ambulatory Visit (INDEPENDENT_AMBULATORY_CARE_PROVIDER_SITE_OTHER): Payer: BC Managed Care – PPO

## 2019-04-05 ENCOUNTER — Encounter: Payer: Self-pay | Admitting: Family Medicine

## 2019-04-05 VITALS — BP 108/77 | HR 90 | Temp 99.1°F | Ht 61.0 in | Wt 143.0 lb

## 2019-04-05 DIAGNOSIS — K920 Hematemesis: Secondary | ICD-10-CM

## 2019-04-05 DIAGNOSIS — R1084 Generalized abdominal pain: Secondary | ICD-10-CM

## 2019-04-05 DIAGNOSIS — N3000 Acute cystitis without hematuria: Secondary | ICD-10-CM

## 2019-04-05 LAB — POCT URINALYSIS DIP (MANUAL ENTRY)
Bilirubin, UA: NEGATIVE
Glucose, UA: NEGATIVE mg/dL
Ketones, POC UA: NEGATIVE mg/dL
Nitrite, UA: POSITIVE — AB
Protein Ur, POC: 30 mg/dL — AB
Spec Grav, UA: 1.025 (ref 1.010–1.025)
Urobilinogen, UA: 1 E.U./dL
pH, UA: 6 (ref 5.0–8.0)

## 2019-04-05 MED ORDER — OMEPRAZOLE 40 MG PO CPDR: 40.0000 mg | DELAYED_RELEASE_CAPSULE | Freq: Every day | ORAL | 2 refills | Status: DC

## 2019-04-05 MED ORDER — NITROFURANTOIN MONOHYD MACRO 100 MG PO CAPS: 100.0000 mg | ORAL_CAPSULE | Freq: Two times a day (BID) | ORAL | 0 refills | Status: DC

## 2019-04-05 NOTE — Patient Instructions (Signed)
° ° ° °  If you have lab work done today you will be contacted with your lab results within the next 2 weeks.  If you have not heard from us then please contact us. The fastest way to get your results is to register for My Chart. ° ° °IF you received an x-ray today, you will receive an invoice from [facility]. Please contact [facility] at [phone] with questions or concerns regarding your invoice.  ° °IF you received labwork today, you will receive an invoice from [facility]. Please contact [facility] at [phone] with questions or concerns regarding your invoice.  ° °Our billing staff will not be able to assist you with questions regarding bills from these companies. ° °You will be contacted with the lab results as soon as they are available. The fastest way to get your results is to activate your My Chart account. Instructions are located on the last page of this paperwork. If you have not heard from us regarding the results in 2 weeks, please contact this office. °  ° ° ° °

## 2019-04-05 NOTE — Progress Notes (Signed)
9/29/202011:59 AM  Michaela Williams 1996-03-05, 23 y.o., female [id number]  Chief Complaint  Patient presents with  . Emesis    has been having this for the pst 2 yrs, says she throwing up blood since the birth of her son. Had labs and colonoscopy in the past    HPI:   Patient is a 23 y.o. female with past medical history significant for bipolar disorder, LEFT breast cancer ?? who presents today for vomiting  Since aug 2020 recurrence of vomiting blood - bright red blood, sometimes with food, sometimes blood alone Most common in the mornings, lasts several hours Becoming more frequent, almost weekly Starts with very painful sharp cramps in mid abdomen Starting to compromise work, FMLA needed, works in call center, customers c/o of groaning, etc Has not noticed any food triggers Reports decreased appettite and nausea Denies any bloating, diarrhea, constipation, black tarry stools, bright red blood in stool Has not been drinking coffee/energy drink since march Does drink sweet tea daily - 1-2 cups Denies any NSAID's Denies any heart burn Non smoker, denies any THC or etoh Adopted, limited fhx, no known GI cancer All previous episodes resolved on there own Remote h/o bulemia  Currently happy with weight Currently on her menses Not sexually active Denies any worsening depression or anxiety Feels mood is stable  egd nov 2018 -  Mild gastritis, neg h pylori, neg dysplasia Ct abd/pelvic w contrast  Nov 2018 - stool through out colon  Lab Results  Component Value Date   WBC 8.0 12/07/2018   HGB 13.8 12/07/2018   HCT 40.3 12/07/2018   MCV 90 12/07/2018   PLT 268 12/07/2018   Lab Results  Component Value Date   ALT 14 08/13/2018   AST 14 (L) 08/13/2018   ALKPHOS 77 08/13/2018   BILITOT 1.5 (H) 08/13/2018    Depression screen PHQ 2/9 12/22/2018 12/07/2018 01/06/2018  Decreased Interest 3 3 3   Down, Depressed, Hopeless 3 3 1   PHQ - 2 Score 6 6 4   Altered sleeping 3 3 3    Tired, decreased energy 3 3 3   Change in appetite 0 3 3  Feeling bad or failure about yourself  2 3 3   Trouble concentrating 3 3 3   Moving slowly or fidgety/restless 3 3 3   Suicidal thoughts 2 0 1  PHQ-9 Score 22 24 23   Difficult doing work/chores Extremely dIfficult Very difficult -    Fall Risk  04/05/2019 12/22/2018 12/07/2018 01/06/2018 07/29/2017  Falls in the past year? 0 0 0 No No  Number falls in past yr: 0 0 0 - -  Injury with Fall? 0 0 0 - -  Follow up - Falls evaluation completed Falls evaluation completed - -     Allergies  Allergen Reactions  . Lexapro [Escitalopram] Other (See Comments)    Didn't like how it made her feel  . Lithium Other (See Comments)    SEIZURES, Childhood reaction    Prior to Admission medications   Not on File    Past Medical History:  Diagnosis Date  . Anemia   . Asthma   . breast ca dx'd 05/2017   left  . Depression    no meds in 3 yrs  . Headache   . Infection    UTI  . Seizures ([place]) 2014   "seizure like activity", reaction to medicaiton, none since    Past Surgical History:  Procedure Laterality Date  . ESOPHAGOGASTRODUODENOSCOPY (EGD) WITH PROPOFOL N/A 06/03/2017  Procedure: ESOPHAGOGASTRODUODENOSCOPY (EGD) WITH PROPOFOL;  Surgeon: Ladene Artist, MD;  Location: [facility];  Service: Endoscopy;  Laterality: N/A;  . NO PAST SURGERIES      Social History   Tobacco Use  . Smoking status: Former Smoker    Types: Cigarettes    Quit date: 12/19/2014    Years since quitting: 4.2  . Smokeless tobacco: Never Used  . Tobacco comment: age 72  Substance Use Topics  . Alcohol use: Yes    Comment: occasionally    Family History  Adopted: Yes  Problem Relation Age of Onset  . Mental illness Mother   . Cancer Mother   . Breast cancer Mother   . Cancer Maternal Grandmother   . Hypertension Maternal Grandmother   . Diabetes Maternal Grandmother   . Cancer Maternal Aunt   . Breast cancer Maternal Aunt   . Breast cancer  Sister   . Breast cancer Maternal Aunt   . Breast cancer Maternal Aunt   . Breast cancer Maternal Aunt   . Breast cancer Maternal Aunt     ROS Per hpi  OBJECTIVE:  Today's Vitals   04/05/19 1154  BP: 108/77  Pulse: 90  Temp: 99.1 F (37.3 C)  SpO2: 100%  Weight: 143 lb (64.9 kg)  Height: 5\' 1"  (1.549 m)   Body mass index is 27.02 kg/m.  Wt Readings from Last 3 Encounters:  04/05/19 143 lb (64.9 kg)  12/22/18 141 lb 9.6 oz (64.2 kg)  01/06/18 150 lb 6.4 oz (68.2 kg)    Physical Exam Vitals signs and nursing note reviewed.  Constitutional:      Appearance: She is well-developed.  HENT:     Head: Normocephalic and atraumatic.     Mouth/Throat:     Pharynx: No oropharyngeal exudate.  Eyes:     General: No scleral icterus.    Conjunctiva/sclera: Conjunctivae normal.     Pupils: Pupils are equal, round, and reactive to light.  Neck:     Musculoskeletal: Neck supple.  Cardiovascular:     Rate and Rhythm: Normal rate and regular rhythm.     Heart sounds: Normal heart sounds. No murmur. No friction rub. No gallop.   Pulmonary:     Effort: Pulmonary effort is normal.     Breath sounds: Normal breath sounds. No wheezing, rhonchi or rales.  Abdominal:     General: Bowel sounds are normal.     Palpations: Abdomen is soft.     Tenderness: There is abdominal tenderness in the right lower quadrant and left upper quadrant. There is guarding. There is no rebound.     Hernia: No hernia is present.  Skin:    General: Skin is warm and dry.  Neurological:     Mental Status: She is alert and oriented to person, place, and time.     Results for orders placed or performed in visit on 04/05/19 (from the past 24 hour(s))  POCT urinalysis dipstick     Status: Abnormal   Collection Time: 04/05/19 12:39 PM  Result Value Ref Range   Color, UA orange (A) yellow   Clarity, UA cloudy (A) clear   Glucose, UA negative negative mg/dL   Bilirubin, UA negative negative   Ketones, POC  UA negative negative mg/dL   Spec Grav, UA 1.025 1.010 - 1.025   Blood, UA large (A) negative   pH, UA 6.0 5.0 - 8.0   Protein Ur, POC =30 (A) negative mg/dL   Urobilinogen, UA 1.0 0.2  or 1.0 E.U./dL   Nitrite, UA Positive (A) Negative   Leukocytes, UA Trace (A) Negative  on her menses  Dg Abd 2 Views  Result Date: 04/05/2019 CLINICAL DATA:  Generalized abdominal pain. EXAM: ABDOMEN - 2 VIEW COMPARISON:  CT scan and radiograph of June 02, 2017. FINDINGS: The bowel gas pattern is normal. There is no evidence of free air. No radio-opaque calculi or other significant radiographic abnormality is seen. IMPRESSION: No evidence of bowel obstruction or ileus. Electronically Signed   By: Marijo Conception M.D.   On: 04/05/2019 13:45     ASSESSMENT and PLAN  1. Generalized abdominal pain 2. Hematemesis with nausea Labs pending. xary neg. Start high dose PPI. FMLA paperwork will be completed. Consider GI referral. ER precautions given.  - POCT urinalysis dipstick - Comprehensive metabolic panel - Lipase - Amylase - DG Abd 2 Views; Future - Sedimentation Rate - C-reactive protein - Phosphorus - Magnesium - CBC with Differential/Platelet  3. Acute cystitis without hematuria Discussed supportive measures, new meds r/se/b and RTC precautions. - Urine Culture  Other orders - omeprazole (PRILOSEC) 40 MG capsule; Take 1 capsule (40 mg total) by mouth at bedtime. - nitrofurantoin, macrocrystal-monohydrate, (MACROBID) 100 MG capsule; Take 1 capsule (100 mg total) by mouth 2 (two) times daily.  Return in about 4 weeks (around 05/03/2019).    Rutherford Guys, MD [facility] [place], [place] [address] Ph.  [phone] Fax [phone]

## 2019-04-06 LAB — CBC WITH DIFFERENTIAL/PLATELET
Basophils Absolute: 0 10*3/uL (ref 0.0–0.2)
Basos: 0 %
EOS (ABSOLUTE): 0 10*3/uL (ref 0.0–0.4)
Eos: 0 %
Hematocrit: 42.5 % (ref 34.0–46.6)
Hemoglobin: 14.4 g/dL (ref 11.1–15.9)
Immature Grans (Abs): 0 10*3/uL (ref 0.0–0.1)
Immature Granulocytes: 0 %
Lymphocytes Absolute: 1.2 10*3/uL (ref 0.7–3.1)
Lymphs: 19 %
MCH: 30.8 pg (ref 26.6–33.0)
MCHC: 33.9 g/dL (ref 31.5–35.7)
MCV: 91 fL (ref 79–97)
Monocytes Absolute: 0.5 10*3/uL (ref 0.1–0.9)
Monocytes: 7 %
Neutrophils Absolute: 4.6 10*3/uL (ref 1.4–7.0)
Neutrophils: 74 %
Platelets: 264 10*3/uL (ref 150–450)
RBC: 4.68 x10E6/uL (ref 3.77–5.28)
RDW: 12.1 % (ref 11.7–15.4)
WBC: 6.3 10*3/uL (ref 3.4–10.8)

## 2019-04-06 LAB — COMPREHENSIVE METABOLIC PANEL
ALT: 6 IU/L (ref 0–32)
AST: 12 IU/L (ref 0–40)
Albumin/Globulin Ratio: 1.8 (ref 1.2–2.2)
Albumin: 4.8 g/dL (ref 3.9–5.0)
Alkaline Phosphatase: 87 IU/L (ref 39–117)
BUN/Creatinine Ratio: 15 (ref 9–23)
BUN: 12 mg/dL (ref 6–20)
Bilirubin Total: 0.9 mg/dL (ref 0.0–1.2)
CO2: 18 mmol/L — ABNORMAL LOW (ref 20–29)
Calcium: 9.5 mg/dL (ref 8.7–10.2)
Chloride: 104 mmol/L (ref 96–106)
Creatinine, Ser: 0.81 mg/dL (ref 0.57–1.00)
GFR calc Af Amer: 118 mL/min/{1.73_m2} (ref >59)
GFR calc non Af Amer: 102 mL/min/{1.73_m2} (ref >59)
Globulin, Total: 2.7 g/dL (ref 1.5–4.5)
Glucose: 95 mg/dL (ref 65–99)
Potassium: 4.3 mmol/L (ref 3.5–5.2)
Sodium: 140 mmol/L (ref 134–144)
Total Protein: 7.5 g/dL (ref 6.0–8.5)

## 2019-04-06 LAB — MAGNESIUM: Magnesium: 2 mg/dL (ref 1.6–2.3)

## 2019-04-06 LAB — C-REACTIVE PROTEIN: CRP: <1 mg/L (ref 0–10)

## 2019-04-06 LAB — AMYLASE: Amylase: 45 U/L (ref 31–110)

## 2019-04-06 LAB — PHOSPHORUS: Phosphorus: 3.4 mg/dL (ref 3.0–4.3)

## 2019-04-06 LAB — LIPASE: Lipase: 16 U/L (ref 14–72)

## 2019-04-06 LAB — SEDIMENTATION RATE: Sed Rate: 17 mm/hr (ref 0–32)

## 2019-04-08 LAB — URINE CULTURE

## 2019-04-25 ENCOUNTER — Other Ambulatory Visit: Payer: Self-pay | Admitting: Family Medicine

## 2019-04-25 ENCOUNTER — Telehealth: Payer: Self-pay

## 2019-04-25 DIAGNOSIS — R1084 Generalized abdominal pain: Secondary | ICD-10-CM

## 2019-04-25 DIAGNOSIS — K920 Hematemesis: Secondary | ICD-10-CM

## 2019-04-25 NOTE — Telephone Encounter (Signed)
fmla formes faxed to [id number]

## 2019-05-03 ENCOUNTER — Other Ambulatory Visit: Payer: Self-pay

## 2019-05-03 ENCOUNTER — Encounter: Payer: Self-pay | Admitting: Family Medicine

## 2019-05-03 ENCOUNTER — Ambulatory Visit (INDEPENDENT_AMBULATORY_CARE_PROVIDER_SITE_OTHER): Payer: BC Managed Care – PPO | Admitting: Family Medicine

## 2019-05-03 VITALS — BP 120/86 | HR 88 | Temp 98.9°F | Ht 61.0 in | Wt 145.0 lb

## 2019-05-03 DIAGNOSIS — K920 Hematemesis: Secondary | ICD-10-CM | POA: Diagnosis not present

## 2019-05-03 DIAGNOSIS — R569 Unspecified convulsions: Secondary | ICD-10-CM | POA: Diagnosis not present

## 2019-05-03 DIAGNOSIS — N3000 Acute cystitis without hematuria: Secondary | ICD-10-CM

## 2019-05-03 MED ORDER — AMOXICILLIN 500 MG PO CAPS: 500.0000 mg | ORAL_CAPSULE | Freq: Three times a day (TID) | ORAL | 0 refills | Status: DC

## 2019-05-03 NOTE — Patient Instructions (Addendum)
  [facility] [address] [phone]   If you have lab work done today you will be contacted with your lab results within the next 2 weeks.  If you have not heard from [place] then please contact us. The fastest way to get your results is to register for My Chart.   IF you received an x-ray today, you will receive an invoice from [facility]. Please contact [facility] at [phone] with questions or concerns regarding your invoice.   IF you received labwork today, you will receive an invoice from [facility]. Please contact [facility] at [phone] with questions or concerns regarding your invoice.   Our billing staff will not be able to assist you with questions regarding bills from these companies.  You will be contacted with the lab results as soon as they are available. The fastest way to get your results is to activate your My Chart account. Instructions are located on the last page of this paperwork. If you have not heard from [place] regarding the results in 2 weeks, please contact this office.

## 2019-05-03 NOTE — Progress Notes (Signed)
10/27/20204:54 PM  Michaela Williams Aug 17, 1995, 23 y.o., female [id number]  Chief Complaint  Patient presents with  . Abdominal Pain    having siezures, feeling spacey in thought and forgetting things. still having stomach pain. Has not taken any of the med due to seizures starting 2 days after last visit and numbness upper part of body.. Has more forms to be completed    HPI:   Patient is a 23 y.o. female with past medical history significant for bipolar disorder who presents today for followup  Last OV sept 2020 - vomiting blood, epigastric pain, referred to GI, started on high dose PPI Labs normal other than UTI, rx abx  She never started medications due to concerns for seizures She continues to vomit, please see previous note for further details  Patient reports that she started having seizures 2 days after our OV in sept They have been almost daily Had seizures in her late teens She describes prodrome of getting confused, having issues with getting her words together, gets very frustrated, irritable, followed by migraine, numbness, tingling feeling, feels like a cloud comes over her, and then collapses, BUE jerking, no LOC  Wondering if stress related, return of seizures, or due to untreated mental health issues Denies any mania  Needs FMLA forms   Depression screen [facility] 2/9 05/03/2019 04/05/2019 12/22/2018  Decreased Interest 0 2 3  Down, Depressed, Hopeless - 3 3  PHQ - 2 Score 0 5 6  Altered sleeping - 3 3  Tired, decreased energy - 3 3  Change in appetite - 1 0  Feeling bad or failure about yourself  - 3 2  Trouble concentrating - 3 3  Moving slowly or fidgety/restless - 2 3  Suicidal thoughts - 2 2  PHQ-9 Score - 22 22  Difficult doing work/chores - Extremely dIfficult Extremely dIfficult    Fall Risk  05/03/2019 04/05/2019 12/22/2018 12/07/2018 01/06/2018  Falls in the past year? 0 0 0 0 No  Number falls in past yr: 0 0 0 0 -  Injury with Fall? 0 0 0 0 -  Risk  for fall due to : Impaired balance/gait - - - -  Risk for fall due to: Comment has seizures - - - -  Follow up - - Falls evaluation completed Falls evaluation completed -     Allergies  Allergen Reactions  . Lexapro [Escitalopram] Other (See Comments)    Didn't like how it made her feel  . Lithium Other (See Comments)    SEIZURES, Childhood reaction    Prior to Admission medications   Medication Sig Start Date End Date Taking? Authorizing Provider  nitrofurantoin, macrocrystal-monohydrate, (MACROBID) 100 MG capsule Take 1 capsule (100 mg total) by mouth 2 (two) times daily. Patient not taking: Reported on 05/03/2019 04/05/19   Rutherford Guys, MD  omeprazole (PRILOSEC) 40 MG capsule Take 1 capsule (40 mg total) by mouth at bedtime. Patient not taking: Reported on 05/03/2019 04/05/19   Rutherford Guys, MD    Past Medical History:  Diagnosis Date  . Anemia   . Asthma   . breast ca dx'd 05/2017   left  . Depression    no meds in 3 yrs  . Headache   . Infection    UTI  . Seizures ([place]) 2014   "seizure like activity", reaction to medicaiton, none since    Past Surgical History:  Procedure Laterality Date  . ESOPHAGOGASTRODUODENOSCOPY (EGD) WITH PROPOFOL N/A 06/03/2017   Procedure:  ESOPHAGOGASTRODUODENOSCOPY (EGD) WITH PROPOFOL;  Surgeon: Ladene Artist, MD;  Location: [facility];  Service: Endoscopy;  Laterality: N/A;  . NO PAST SURGERIES      Social History   Tobacco Use  . Smoking status: Former Smoker    Types: Cigarettes    Quit date: 12/19/2014    Years since quitting: 4.3  . Smokeless tobacco: Never Used  . Tobacco comment: age 64  Substance Use Topics  . Alcohol use: Yes    Comment: occasionally    Family History  Adopted: Yes  Problem Relation Age of Onset  . Mental illness Mother   . Cancer Mother   . Breast cancer Mother   . Cancer Maternal Grandmother   . Hypertension Maternal Grandmother   . Diabetes Maternal Grandmother   . Cancer Maternal  Aunt   . Breast cancer Maternal Aunt   . Breast cancer Sister   . Breast cancer Maternal Aunt   . Breast cancer Maternal Aunt   . Breast cancer Maternal Aunt   . Breast cancer Maternal Aunt     ROS Per hpi  OBJECTIVE:  Today's Vitals   05/03/19 1636  BP: 120/86  Pulse: 88  Temp: 98.9 F (37.2 C)  SpO2: 97%  Weight: 145 lb (65.8 kg)  Height: 5\' 1"  (1.549 m)   Body mass index is 27.4 kg/m.   Physical Exam Vitals signs and nursing note reviewed.  Constitutional:      Appearance: She is well-developed.  HENT:     Head: Normocephalic and atraumatic.  Eyes:     General: No scleral icterus.    Conjunctiva/sclera: Conjunctivae normal.     Pupils: Pupils are equal, round, and reactive to light.  Neck:     Musculoskeletal: Neck supple.  Pulmonary:     Effort: Pulmonary effort is normal.  Skin:    General: Skin is warm and dry.  Neurological:     General: No focal deficit present.     Mental Status: She is alert and oriented to person, place, and time.     Gait: Gait is intact.  Psychiatric:        Attention and Perception: Attention normal.        Mood and Affect: Mood is anxious. Affect is tearful.        Speech: Speech normal. Speech is not rapid and pressured.        Behavior: Behavior is cooperative.        Thought Content: Thought content normal.     No results found for this or any previous visit (from the past 24 hour(s)).  No results found.   ASSESSMENT and PLAN  1. Seizure-like activity ([place]) Per history, concerning for pseudoseizures. Referring to neurology. Also provided patient with info for [employer] health, as insurance coverage is limited.  - Ambulatory referral to Neurology  2. Hematemesis with nausea Start PPI as prescribed, awaiting appt with GI  3. Acute cystitis without hematuria Discussed start abx, reviewed r/se/b  Other orders - amoxicillin (AMOXIL) 500 MG capsule; Take 1 capsule (500 mg total) by mouth 3 (three) times  daily.  Return in about 4 weeks (around 05/31/2019).    Rutherford Guys, MD [facility] [place], [place] [address] Ph.  [phone] Fax [phone]

## 2019-05-05 ENCOUNTER — Ambulatory Visit: Payer: BC Managed Care – PPO | Admitting: Family Medicine

## 2019-05-31 ENCOUNTER — Telehealth: Payer: Self-pay | Admitting: Family Medicine

## 2019-05-31 NOTE — Telephone Encounter (Signed)
Disability paperwork came via fax to be completed by provider Dr.Santiago. paperwork placed in Provider CMA box to 11.24.2020 NO fees collected . please bill pt 15.00 fee

## 2019-06-15 ENCOUNTER — Encounter: Payer: Self-pay | Admitting: Family Medicine

## 2019-06-21 ENCOUNTER — Telehealth: Payer: Self-pay | Admitting: *Deleted

## 2019-06-21 ENCOUNTER — Ambulatory Visit: Payer: BC Managed Care – PPO | Admitting: Neurology

## 2019-06-21 ENCOUNTER — Encounter: Payer: Self-pay | Admitting: Neurology

## 2019-06-21 NOTE — Telephone Encounter (Signed)
Pt no showed new pt appt today. 

## 2019-07-08 NOTE — L&D Delivery Note (Addendum)
Delivery Note At 8:10 AM a viable female was delivered via Vaginal, Spontaneous (Presentation: Left Occiput Anterior) after IOL for post dates. Labor augmented with pitocin.  APGAR:8,9 ; weight pending.   Placenta status: Spontaneous, Intact.  Cord: 3 vessels with the following complications: None.   Anesthesia: Epidural Episiotomy: none Lacerations: none Suture Repair: n/a Est. Blood Loss (mL): <137mL  Mom to postpartum.  Baby to Couplet care / Skin to Skin.  Michaela Williams, 8:39 AM  Attestation:  I was gloved and present for entire delivery SVD without incident No difficulty with shoulders No lacerations  Seabron Spates, CNM

## 2019-07-09 ENCOUNTER — Inpatient Hospital Stay (HOSPITAL_COMMUNITY)
Admission: AD | Admit: 2019-07-09 | Discharge: 2019-07-09 | Disposition: A | Discharge status: Home / Self Care | Payer: BLUE CROSS/BLUE SHIELD | Attending: Obstetrics & Gynecology | Admitting: Obstetrics & Gynecology

## 2019-07-09 ENCOUNTER — Inpatient Hospital Stay (HOSPITAL_COMMUNITY): Payer: BLUE CROSS/BLUE SHIELD

## 2019-07-09 ENCOUNTER — Encounter (HOSPITAL_COMMUNITY): Payer: Self-pay | Admitting: Obstetrics & Gynecology

## 2019-07-09 ENCOUNTER — Other Ambulatory Visit: Payer: Self-pay

## 2019-07-09 DIAGNOSIS — O208 Other hemorrhage in early pregnancy: Secondary | ICD-10-CM | POA: Diagnosis not present

## 2019-07-09 DIAGNOSIS — Z79899 Other long term (current) drug therapy: Secondary | ICD-10-CM | POA: Insufficient documentation

## 2019-07-09 DIAGNOSIS — J45909 Unspecified asthma, uncomplicated: Secondary | ICD-10-CM | POA: Insufficient documentation

## 2019-07-09 DIAGNOSIS — Z3A09 9 weeks gestation of pregnancy: Secondary | ICD-10-CM | POA: Diagnosis not present

## 2019-07-09 DIAGNOSIS — O26891 Other specified pregnancy related conditions, first trimester: Secondary | ICD-10-CM | POA: Diagnosis not present

## 2019-07-09 DIAGNOSIS — R109 Unspecified abdominal pain: Secondary | ICD-10-CM | POA: Diagnosis not present

## 2019-07-09 DIAGNOSIS — O2341 Unspecified infection of urinary tract in pregnancy, first trimester: Secondary | ICD-10-CM

## 2019-07-09 DIAGNOSIS — N390 Urinary tract infection, site not specified: Secondary | ICD-10-CM | POA: Diagnosis not present

## 2019-07-09 DIAGNOSIS — Z87891 Personal history of nicotine dependence: Secondary | ICD-10-CM | POA: Diagnosis not present

## 2019-07-09 DIAGNOSIS — R55 Syncope and collapse: Secondary | ICD-10-CM | POA: Diagnosis not present

## 2019-07-09 DIAGNOSIS — R42 Dizziness and giddiness: Secondary | ICD-10-CM | POA: Insufficient documentation

## 2019-07-09 DIAGNOSIS — O99891 Other specified diseases and conditions complicating pregnancy: Secondary | ICD-10-CM | POA: Diagnosis not present

## 2019-07-09 DIAGNOSIS — R112 Nausea with vomiting, unspecified: Secondary | ICD-10-CM | POA: Diagnosis not present

## 2019-07-09 DIAGNOSIS — O418X10 Other specified disorders of amniotic fluid and membranes, first trimester, not applicable or unspecified: Secondary | ICD-10-CM

## 2019-07-09 DIAGNOSIS — R1084 Generalized abdominal pain: Secondary | ICD-10-CM | POA: Diagnosis not present

## 2019-07-09 DIAGNOSIS — R1030 Lower abdominal pain, unspecified: Secondary | ICD-10-CM | POA: Diagnosis not present

## 2019-07-09 DIAGNOSIS — O468X1 Other antepartum hemorrhage, first trimester: Secondary | ICD-10-CM

## 2019-07-09 DIAGNOSIS — O26899 Other specified pregnancy related conditions, unspecified trimester: Secondary | ICD-10-CM

## 2019-07-09 LAB — URINALYSIS, ROUTINE W REFLEX MICROSCOPIC
Bilirubin Urine: NEGATIVE
Glucose, UA: NEGATIVE mg/dL
Hgb urine dipstick: NEGATIVE
Ketones, ur: NEGATIVE mg/dL
Nitrite: NEGATIVE
Protein, ur: 30 mg/dL — AB
Specific Gravity, Urine: 1.02 (ref 1.005–1.030)
pH: 7 (ref 5.0–8.0)

## 2019-07-09 LAB — COMPREHENSIVE METABOLIC PANEL
ALT: 11 U/L (ref 0–44)
AST: 12 U/L — ABNORMAL LOW (ref 15–41)
Albumin: 3.6 g/dL (ref 3.5–5.0)
Alkaline Phosphatase: 47 U/L (ref 38–126)
Anion gap: 6 (ref 5–15)
BUN: 10 mg/dL (ref 6–20)
CO2: 23 mmol/L (ref 22–32)
Calcium: 9.2 mg/dL (ref 8.9–10.3)
Chloride: 106 mmol/L (ref 98–111)
Creatinine, Ser: 0.59 mg/dL (ref 0.44–1.00)
GFR calc Af Amer: >60 mL/min (ref >60)
GFR calc non Af Amer: >60 mL/min (ref >60)
Glucose, Bld: 106 mg/dL — ABNORMAL HIGH (ref 70–99)
Potassium: 4.2 mmol/L (ref 3.5–5.1)
Sodium: 135 mmol/L (ref 135–145)
Total Bilirubin: 0.6 mg/dL (ref 0.3–1.2)
Total Protein: 7 g/dL (ref 6.5–8.1)

## 2019-07-09 LAB — CBC WITH DIFFERENTIAL/PLATELET
Abs Immature Granulocytes: 0.05 10*3/uL (ref 0.00–0.07)
Basophils Absolute: 0 10*3/uL (ref 0.0–0.1)
Basophils Relative: 0 %
Eosinophils Absolute: 0 10*3/uL (ref 0.0–0.5)
Eosinophils Relative: 0 %
HCT: 39.5 % (ref 36.0–46.0)
Hemoglobin: 13.4 g/dL (ref 12.0–15.0)
Immature Granulocytes: 0 %
Lymphocytes Relative: 8 %
Lymphs Abs: 1 10*3/uL (ref 0.7–4.0)
MCH: 30.9 pg (ref 26.0–34.0)
MCHC: 33.9 g/dL (ref 30.0–36.0)
MCV: 91.2 fL (ref 80.0–100.0)
Monocytes Absolute: 0.7 10*3/uL (ref 0.1–1.0)
Monocytes Relative: 6 %
Neutro Abs: 11 10*3/uL — ABNORMAL HIGH (ref 1.7–7.7)
Neutrophils Relative %: 86 %
Platelets: 275 10*3/uL (ref 150–400)
RBC: 4.33 MIL/uL (ref 3.87–5.11)
RDW: 12.2 % (ref 11.5–15.5)
WBC: 12.8 10*3/uL — ABNORMAL HIGH (ref 4.0–10.5)
nRBC: 0 % (ref 0.0–0.2)

## 2019-07-09 LAB — HIV ANTIBODY (ROUTINE TESTING W REFLEX): HIV Screen 4th Generation wRfx: NONREACTIVE

## 2019-07-09 LAB — HCG, QUANTITATIVE, PREGNANCY: hCG, Beta Chain, Quant, S: 113217 m[IU]/mL — ABNORMAL HIGH (ref <5)

## 2019-07-09 LAB — ABO/RH: ABO/RH(D): A POS

## 2019-07-09 LAB — POCT PREGNANCY, URINE: Preg Test, Ur: POSITIVE — AB

## 2019-07-09 MED ORDER — CEPHALEXIN 500 MG PO CAPS: 500.0000 mg | ORAL_CAPSULE | Freq: Four times a day (QID) | ORAL | 0 refills | Status: DC

## 2019-07-09 NOTE — MAU Note (Signed)
Pt reports to mau via ems with c/o lower left abd pain for the past few days that has worsened today.  Pt reports syncopal episode at work this morning.  Pt denies any vag bleeding today but reports some spotting a few days ago.

## 2019-07-09 NOTE — Discharge Instructions (Signed)
Abdominal Pain During Pregnancy  Belly (abdominal) pain is common during pregnancy. There are many possible causes. Most of the time, it is not a serious problem. Other times, it can be a sign that something is wrong with the pregnancy. Always tell your doctor if you have belly pain. Follow these instructions at home:  Do not have sex or put anything in your vagina until your pain goes away completely.  Get plenty of rest until your pain gets better.  Drink enough fluid to keep your pee (urine) pale yellow.  Take over-the-counter and prescription medicines only as told by your doctor.  Keep all follow-up visits as told by your doctor. This is important. Contact a doctor if:  Your pain continues or gets worse after resting.  You have lower belly pain that: ? Comes and goes at regular times. ? Spreads to your back. ? Feels like menstrual cramps.  You have pain or burning when you pee (urinate). Get help right away if:  You have a fever or chills.  You have vaginal bleeding.  You are leaking fluid from your vagina.  You are passing tissue from your vagina.  You throw up (vomit) for more than 24 hours.  You have watery poop (diarrhea) for more than 24 hours.  Your baby is moving less than usual.  You feel very weak or faint.  You have shortness of breath.  You have very bad pain in your upper belly. Summary  Belly (abdominal) pain is common during pregnancy. There are many possible causes.  If you have belly pain during pregnancy, tell your doctor right away.  Keep all follow-up visits as told by your doctor. This is important. This information is not intended to replace advice given to you by your health care provider. Make sure you discuss any questions you have with your health care provider. Document Revised: 10/11/2018 Document Reviewed: 09/25/2016 Elsevier Patient Education  Jud Hematoma  A subchorionic hematoma is a gathering  of blood between the outer wall of the embryo (chorion) and the inner wall of the womb (uterus). This condition can cause vaginal bleeding. If they cause little or no vaginal bleeding, early small hematomas usually shrink on their own and do not affect your baby or pregnancy. When bleeding starts later in pregnancy, or if the hematoma is larger or occurs in older pregnant women, the condition may be more serious. Larger hematomas may get bigger, which increases the chances of miscarriage. This condition also increases the risk of:  Premature separation of the placenta from the uterus.  Premature (preterm) labor.  Stillbirth. What are the causes? The exact cause of this condition is not known. It occurs when blood is trapped between the placenta and the uterine wall because the placenta has separated from the original site of implantation. What increases the risk? You are more likely to develop this condition if:  You were treated with fertility medicines.  You conceived through in vitro fertilization (IVF). What are the signs or symptoms? Symptoms of this condition include:  Vaginal spotting or bleeding.  Contractions of the uterus. These cause abdominal pain. Sometimes you may have no symptoms and the bleeding may only be seen when ultrasound images are taken (transvaginal ultrasound). How is this diagnosed? This condition is diagnosed based on a physical exam. This includes a pelvic exam. You may also have other tests, including:  Blood tests.  Urine tests.  Ultrasound of the abdomen. How is this treated? Treatment for  this condition can vary. Treatment may include:  Watchful waiting. You will be monitored closely for any changes in bleeding. During this stage: ? The hematoma may be reabsorbed by the body. ? The hematoma may separate the fluid-filled space containing the embryo (gestational sac) from the wall of the womb (endometrium).  Medicines.  Activity restriction. This  may be needed until the bleeding stops. Follow these instructions at home:  Stay on bed rest if told to do so by your health care provider.  Do not lift anything that is heavier than 10 lbs. (4.5 kg) or as told by your health care provider.  Do not use any products that contain nicotine or tobacco, such as cigarettes and e-cigarettes. If you need help quitting, ask your health care provider.  Track and write down the number of pads you use each day and how soaked (saturated) they are.  Do not use tampons.  Keep all follow-up visits as told by your health care provider. This is important. Your health care provider may ask you to have follow-up blood tests or ultrasound tests or both. Contact a health care provider if:  You have any vaginal bleeding.  You have a fever. Get help right away if:  You have severe cramps in your stomach, back, abdomen, or pelvis.  You pass large clots or tissue. Save any tissue for your health care provider to look at.  You have more vaginal bleeding, and you faint or become lightheaded or weak. Summary  A subchorionic hematoma is a gathering of blood between the outer wall of the placenta and the uterus.  This condition can cause vaginal bleeding.  Sometimes you may have no symptoms and the bleeding may only be seen when ultrasound images are taken.  Treatment may include watchful waiting, medicines, or activity restriction. This information is not intended to replace advice given to you by your health care provider. Make sure you discuss any questions you have with your health care provider. Document Revised: 06/05/2017 Document Reviewed: 08/19/2016 Elsevier Patient Education  2020 [employer].

## 2019-07-09 NOTE — MAU Provider Note (Signed)
History     CSN: [id number]  Arrival date and time: 07/09/19 1223   First Provider Initiated Contact with Patient 07/09/19 1244      Chief Complaint  Patient presents with  . Abdominal Pain  . Dizziness  . Loss of Consciousness   HPI   [name] is a 24 y.o. female G60P1001 @ [redacted]w[redacted]d here in MAU after a syncopal episode while at work. States she was in the bathroom washing her hands after she vomited and does not remember much after that. States she job called 911 after finding her on the floor. States she has not been feeling well from nausea and vomiting. States she has nausea all the time. States prior to going to work she ate a single english muffin with orange juice.  She is not taking anything for the symptoms. States she is not eating and drinking like she should. The abdominal pain is in the middle of her lower abdomen and comes and goes. She has pressure after urinating. No bleeding. No dizziness currently.   OB History    Gravida  2   Para  1   Term  1   Preterm  0   AB  0   Living  1     SAB  0   TAB  0   Ectopic  0   Multiple  0   Live Births  1           Past Medical History:  Diagnosis Date  . Anemia   . Asthma   . breast ca dx'd 05/2017   left  . Depression    no meds in 3 yrs  . Headache   . Infection    UTI  . Seizures ([place]) 2014   "seizure like activity", reaction to medicaiton, none since    Past Surgical History:  Procedure Laterality Date  . ESOPHAGOGASTRODUODENOSCOPY (EGD) WITH PROPOFOL N/A 06/03/2017   Procedure: ESOPHAGOGASTRODUODENOSCOPY (EGD) WITH PROPOFOL;  Surgeon: Ladene Artist, MD;  Location: [facility];  Service: Endoscopy;  Laterality: N/A;  . NO PAST SURGERIES      Family History  Adopted: Yes  Problem Relation Age of Onset  . Mental illness Mother   . Cancer Mother   . Breast cancer Mother   . Cancer Maternal Grandmother   . Hypertension Maternal Grandmother   . Diabetes Maternal Grandmother   .  Cancer Maternal Aunt   . Breast cancer Maternal Aunt   . Breast cancer Sister   . Breast cancer Maternal Aunt   . Breast cancer Maternal Aunt   . Breast cancer Maternal Aunt   . Breast cancer Maternal Aunt     Social History   Tobacco Use  . Smoking status: Former Smoker    Types: Cigarettes    Quit date: 12/19/2014    Years since quitting: 4.5  . Smokeless tobacco: Never Used  . Tobacco comment: age 88  Substance Use Topics  . Alcohol use: Yes    Comment: occasionally  . Drug use: No    Allergies:  Allergies  Allergen Reactions  . Lexapro [Escitalopram] Other (See Comments)    Didn't like how it made her feel  . Lithium Other (See Comments)    SEIZURES, Childhood reaction    Medications Prior to Admission  Medication Sig Dispense Refill Last Dose  . amoxicillin (AMOXIL) 500 MG capsule Take 1 capsule (500 mg total) by mouth 3 (three) times daily. 15 capsule 0   . omeprazole (  PRILOSEC) 40 MG capsule Take 1 capsule (40 mg total) by mouth at bedtime. (Patient not taking: Reported on 05/03/2019) 30 capsule 2    Results for orders placed or performed during the hospital encounter of 07/09/19 (from the past 48 hour(s))  Pregnancy, urine POC     Status: Abnormal   Collection Time: 07/09/19 12:31 PM  Result Value Ref Range   Preg Test, Ur POSITIVE (A) NEGATIVE    Comment:        THE SENSITIVITY OF THIS METHODOLOGY IS >24 mIU/mL   Urinalysis, Routine w reflex microscopic     Status: Abnormal   Collection Time: 07/09/19 12:38 PM  Result Value Ref Range   Color, Urine YELLOW YELLOW   APPearance HAZY (A) CLEAR   Specific Gravity, Urine 1.020 1.005 - 1.030   pH 7.0 5.0 - 8.0   Glucose, UA NEGATIVE NEGATIVE mg/dL   Hgb urine dipstick NEGATIVE NEGATIVE   Bilirubin Urine NEGATIVE NEGATIVE   Ketones, ur NEGATIVE NEGATIVE mg/dL   Protein, ur 30 (A) NEGATIVE mg/dL   Nitrite NEGATIVE NEGATIVE   Leukocytes,Ua TRACE (A) NEGATIVE   RBC / HPF 0-5 0 - 5 RBC/hpf   WBC, UA 11-20 0 -  5 WBC/hpf   Bacteria, UA MANY (A) NONE SEEN   Squamous Epithelial / LPF 0-5 0 - 5   Mucus PRESENT    Non Squamous Epithelial 0-5 (A) NONE SEEN    Comment: Performed at [facility], [address], [place], [place] [address]  OB Urine Culture     Status: Abnormal (Preliminary result)   Collection Time: 07/09/19 12:41 PM   Specimen: OB Clean Catch; Urine  Result Value Ref Range   Specimen Description OB CLEAN CATCH    Special Requests Normal    Culture (A)     >=100,000 COLONIES/mL KLEBSIELLA PNEUMONIAE SUSCEPTIBILITIES TO FOLLOW NO GROUP B STREP (S.AGALACTIAE) ISOLATED Performed at [facility], [place] [address] [place], [place] [address]    Report Status PENDING   CBC with Differential/Platelet     Status: Abnormal   Collection Time: 07/09/19  1:39 PM  Result Value Ref Range   WBC 12.8 (H) 4.0 - 10.5 K/uL   RBC 4.33 3.87 - 5.11 MIL/uL   Hemoglobin 13.4 12.0 - 15.0 g/dL   HCT 39.5 36.0 - 46.0 %   MCV 91.2 80.0 - 100.0 fL   MCH 30.9 26.0 - 34.0 pg   MCHC 33.9 30.0 - 36.0 g/dL   RDW 12.2 11.5 - 15.5 %   Platelets 275 150 - 400 K/uL   nRBC 0.0 0.0 - 0.2 %   Neutrophils Relative % 86 %   Neutro Abs 11.0 (H) 1.7 - 7.7 K/uL   Lymphocytes Relative 8 %   Lymphs Abs 1.0 0.7 - 4.0 K/uL   Monocytes Relative 6 %   Monocytes Absolute 0.7 0.1 - 1.0 K/uL   Eosinophils Relative 0 %   Eosinophils Absolute 0.0 0.0 - 0.5 K/uL   Basophils Relative 0 %   Basophils Absolute 0.0 0.0 - 0.1 K/uL   Immature Granulocytes 0 %   Abs Immature Granulocytes 0.05 0.00 - 0.07 K/uL    Comment: Performed at [facility] [address], [place], [place] [address]  Comprehensive metabolic panel     Status: Abnormal   Collection Time: 07/09/19  1:39 PM  Result Value Ref Range   Sodium 135 135 - 145 mmol/L   Potassium 4.2 3.5 - 5.1 mmol/L  Chloride 106 98 - 111 mmol/L   CO2 23 22 - 32 mmol/L   Glucose, Bld 106 (H) 70 - 99 mg/dL   BUN 10 6 - 20 mg/dL   Creatinine, Ser 0.59 0.44 - 1.00  mg/dL   Calcium 9.2 8.9 - 10.3 mg/dL   Total Protein 7.0 6.5 - 8.1 g/dL   Albumin 3.6 3.5 - 5.0 g/dL   AST 12 (L) 15 - 41 U/L   ALT 11 0 - 44 U/L   Alkaline Phosphatase 47 38 - 126 U/L   Total Bilirubin 0.6 0.3 - 1.2 mg/dL   GFR calc non Af Amer >60 >60 mL/min   GFR calc Af Amer >60 >60 mL/min   Anion gap 6 5 - 15    Comment: Performed at [facility] [address], [place], [place] [address]  ABO/Rh     Status: None   Collection Time: 07/09/19  1:39 PM  Result Value Ref Range   ABO/RH(D) A POS    No rh immune globuloin      NOT A RH IMMUNE GLOBULIN CANDIDATE, PT RH POSITIVE Performed at [facility] [address], [place], [place] [address]   HIV Antibody (routine testing w rflx)     Status: None   Collection Time: 07/09/19  1:39 PM  Result Value Ref Range   HIV Screen 4th Generation wRfx NON REACTIVE NON REACTIVE    Comment: Performed at [facility] [address], [place], [place] [address]  hCG, quantitative, pregnancy     Status: Abnormal   Collection Time: 07/09/19  1:39 PM  Result Value Ref Range   hCG, Beta Chain, Quant, S 113,217 (H) <5 mIU/mL    Comment:          GEST. AGE      CONC.  (mIU/mL)   <=1 WEEK        5 - 50     2 WEEKS       50 - 500     3 WEEKS       100 - 10,000     4 WEEKS     1,000 - 30,000     5 WEEKS     3,500 - 115,000   6-8 WEEKS     12,000 - 270,000    12 WEEKS     15,000 - 220,000        FEMALE AND NON-PREGNANT FEMALE:     LESS THAN 5 mIU/mL Performed at [facility], [place] [address], [place], [place] [address]    [place] Comp Less 14 Wks  Result Date: 07/09/2019 CLINICAL DATA:  Pelvic pain EXAM: OBSTETRIC <14 WK ULTRASOUND TECHNIQUE: Transabdominal ultrasound was performed for evaluation of the gestation as well as the maternal uterus and adnexal regions. COMPARISON:  None. FINDINGS: Intrauterine gestational sac: Present Yolk sac:  Present Embryo:  Present Cardiac Activity: Present Heart Rate: 168 bpm CRL:   25.8 mm    9 w 2 d                  [place] EDC: 02/09/2020 Subchorionic hemorrhage:  Small subchorionic hemorrhage is noted. Maternal uterus/adnexae: Ovaries are within normal limits bilaterally. IMPRESSION: Single live intrauterine gestation at 9 weeks 2 days with small subchorionic hemorrhage. Electronically Signed   By: Inez Catalina M.D.   On: 07/09/2019 14:14   Review of Systems  Constitutional: Negative for fever.  Gastrointestinal: Positive for abdominal pain, nausea  and vomiting.  Genitourinary: Positive for dysuria and urgency. Negative for flank pain and vaginal bleeding.  Neurological: Negative for dizziness.   Physical Exam   Blood pressure 118/72, pulse (!) 102, temperature 98.7 F (37.1 C), resp. rate 16, last menstrual period 05/04/2019, currently breastfeeding.  Physical Exam  Constitutional: She is oriented to person, place, and time. She appears well-developed and well-nourished. No distress.  HENT:  Head: Normocephalic.  Eyes: Pupils are equal, round, and reactive to light.  GI: Soft. Normal appearance. There is abdominal tenderness in the suprapubic area. There is no rigidity, no rebound and no guarding.  Musculoskeletal:        General: Normal range of motion.     Cervical back: Neck supple.  Neurological: She is alert and oriented to person, place, and time.  Skin: Skin is warm. She is not diaphoretic.  Psychiatric: Her behavior is normal.   MAU Course  Procedures  None  MDM   HIV, CBC, Hcg, ABO US OB transvaginal  Urine culture pending.  Orthostatic vitals normal.  Patient declines nausea medication.   Assessment and Plan   A:  1. [redacted] weeks gestation of pregnancy   2. Abdominal pain in pregnancy   3. Syncope, vasovagal   4. UTI (urinary tract infection) during pregnancy, first trimester   5. Subchorionic hematoma in first trimester, single or unspecified fetus      P:  Discharge home in stable condition Continue Prilosec Rx: [place] Call  office if you change your mind about nausea medications Increase oral fluid intake Add protein to your diet.   Noni Saupe I, NP 07/10/2019 7:08 PM

## 2019-07-11 LAB — CULTURE, OB URINE
Culture: >=100000 — AB
Special Requests: NORMAL

## 2019-07-29 ENCOUNTER — Ambulatory Visit (INDEPENDENT_AMBULATORY_CARE_PROVIDER_SITE_OTHER): Payer: Medicaid Other

## 2019-07-29 DIAGNOSIS — Z3A12 12 weeks gestation of pregnancy: Secondary | ICD-10-CM

## 2019-07-29 DIAGNOSIS — Z3480 Encounter for supervision of other normal pregnancy, unspecified trimester: Secondary | ICD-10-CM | POA: Insufficient documentation

## 2019-07-29 DIAGNOSIS — Z3481 Encounter for supervision of other normal pregnancy, first trimester: Secondary | ICD-10-CM

## 2019-07-29 MED ORDER — BLOOD PRESSURE KIT: 1.0000 | PACK | Freq: Once | 0 refills | Status: AC

## 2019-07-29 NOTE — Addendum Note (Signed)
Addended by: Delrae Alfred on: 07/29/2019 09:12 AM   Modules accepted: Orders

## 2019-07-29 NOTE — Progress Notes (Signed)
I connected with  Michaela Williams on 07/29/19 by a video enabled telemedicine application and verified that I am speaking with the correct person using two identifiers.   I discussed the limitations of evaluation and management by telemedicine. The patient expressed understanding and agreed to proceed.  OB History    Gravida  2   Para  1   Term  1   Preterm  0   AB  0   Living  1     SAB  0   TAB  0   Ectopic  0   Multiple  0   Live Births  1          family history includes Breast cancer in her maternal aunt, maternal aunt, maternal aunt, maternal aunt, maternal aunt, mother, and sister; Cancer in her maternal aunt, maternal grandmother, and mother; Diabetes in her maternal grandmother; Hypertension in her maternal grandmother; Mental illness in her mother. She was adopted.  Assessment   All medical history reviewed with the pt above. Pt reports being adopted therefore not much of the family history was reviewed. She reports they may have some mental disorders as well as some cancers. Pt advised to keep f/u on 08/05/19.  -EH/RMA

## 2019-08-05 ENCOUNTER — Other Ambulatory Visit: Payer: Self-pay

## 2019-08-05 ENCOUNTER — Other Ambulatory Visit (HOSPITAL_COMMUNITY)
Admission: RE | Admit: 2019-08-05 | Discharge: 2019-08-05 | Disposition: A | Discharge status: Home / Self Care | Payer: BC Managed Care – PPO | Source: Ambulatory Visit | Attending: Medical | Admitting: Medical

## 2019-08-05 ENCOUNTER — Encounter: Payer: Self-pay | Admitting: Medical

## 2019-08-05 ENCOUNTER — Ambulatory Visit (INDEPENDENT_AMBULATORY_CARE_PROVIDER_SITE_OTHER): Payer: BC Managed Care – PPO | Admitting: Medical

## 2019-08-05 VITALS — BP 103/70 | HR 85 | Temp 97.8°F | Wt 144.0 lb

## 2019-08-05 DIAGNOSIS — O99341 Other mental disorders complicating pregnancy, first trimester: Secondary | ICD-10-CM

## 2019-08-05 DIAGNOSIS — Z3481 Encounter for supervision of other normal pregnancy, first trimester: Secondary | ICD-10-CM | POA: Diagnosis present

## 2019-08-05 DIAGNOSIS — O26891 Other specified pregnancy related conditions, first trimester: Secondary | ICD-10-CM

## 2019-08-05 DIAGNOSIS — Z23 Encounter for immunization: Secondary | ICD-10-CM

## 2019-08-05 DIAGNOSIS — Z8659 Personal history of other mental and behavioral disorders: Secondary | ICD-10-CM

## 2019-08-05 DIAGNOSIS — F314 Bipolar disorder, current episode depressed, severe, without psychotic features: Secondary | ICD-10-CM

## 2019-08-05 DIAGNOSIS — C50919 Malignant neoplasm of unspecified site of unspecified female breast: Secondary | ICD-10-CM

## 2019-08-05 DIAGNOSIS — Z3A13 13 weeks gestation of pregnancy: Secondary | ICD-10-CM

## 2019-08-05 DIAGNOSIS — Z3480 Encounter for supervision of other normal pregnancy, unspecified trimester: Secondary | ICD-10-CM

## 2019-08-05 MED ORDER — BLOOD PRESSURE KIT DEVI: 1.0000 | 0 refills | Status: DC
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
## 2019-08-05 NOTE — Progress Notes (Signed)
NOB.  C/o spotting x 2 months.  FLU given in RD, tolerated well.

## 2019-08-07 LAB — URINE CULTURE, OB REFLEX

## 2019-08-07 LAB — CULTURE, OB URINE

## 2019-08-09 LAB — CYTOLOGY - PAP
Chlamydia: NEGATIVE
Comment: NEGATIVE
Comment: NORMAL
Diagnosis: NEGATIVE
Neisseria Gonorrhea: NEGATIVE

## 2019-08-09 LAB — OBSTETRIC PANEL, INCLUDING HIV
Antibody Screen: NEGATIVE
Basophils Absolute: 0 10*3/uL (ref 0.0–0.2)
Basos: 0 %
EOS (ABSOLUTE): 0 10*3/uL (ref 0.0–0.4)
Eos: 0 %
HIV Screen 4th Generation wRfx: NONREACTIVE
Hematocrit: 40 % (ref 34.0–46.6)
Hemoglobin: 13.3 g/dL (ref 11.1–15.9)
Hepatitis B Surface Ag: NEGATIVE
Immature Grans (Abs): 0 10*3/uL (ref 0.0–0.1)
Immature Granulocytes: 0 %
Lymphocytes Absolute: 1.6 10*3/uL (ref 0.7–3.1)
Lymphs: 14 %
MCH: 30.6 pg (ref 26.6–33.0)
MCHC: 33.3 g/dL (ref 31.5–35.7)
MCV: 92 fL (ref 79–97)
Monocytes Absolute: 0.7 10*3/uL (ref 0.1–0.9)
Monocytes: 6 %
Neutrophils Absolute: 9 10*3/uL — ABNORMAL HIGH (ref 1.4–7.0)
Neutrophils: 80 %
Platelets: 271 10*3/uL (ref 150–450)
RBC: 4.35 x10E6/uL (ref 3.77–5.28)
RDW: 12.6 % (ref 11.7–15.4)
RPR Ser Ql: NONREACTIVE
Rh Factor: POSITIVE
Rubella Antibodies, IGG: 7.86 index (ref >0.99)
WBC: 11.4 10*3/uL — ABNORMAL HIGH (ref 3.4–10.8)

## 2019-08-14 IMAGING — DX DG CHEST 2V
2 series · 2 of 2 positions shown · non-contrast
Comparison: None.

CLINICAL DATA: Initial evaluation for acute cough.

EXAM:
CHEST  2 VIEW

[chest pa]
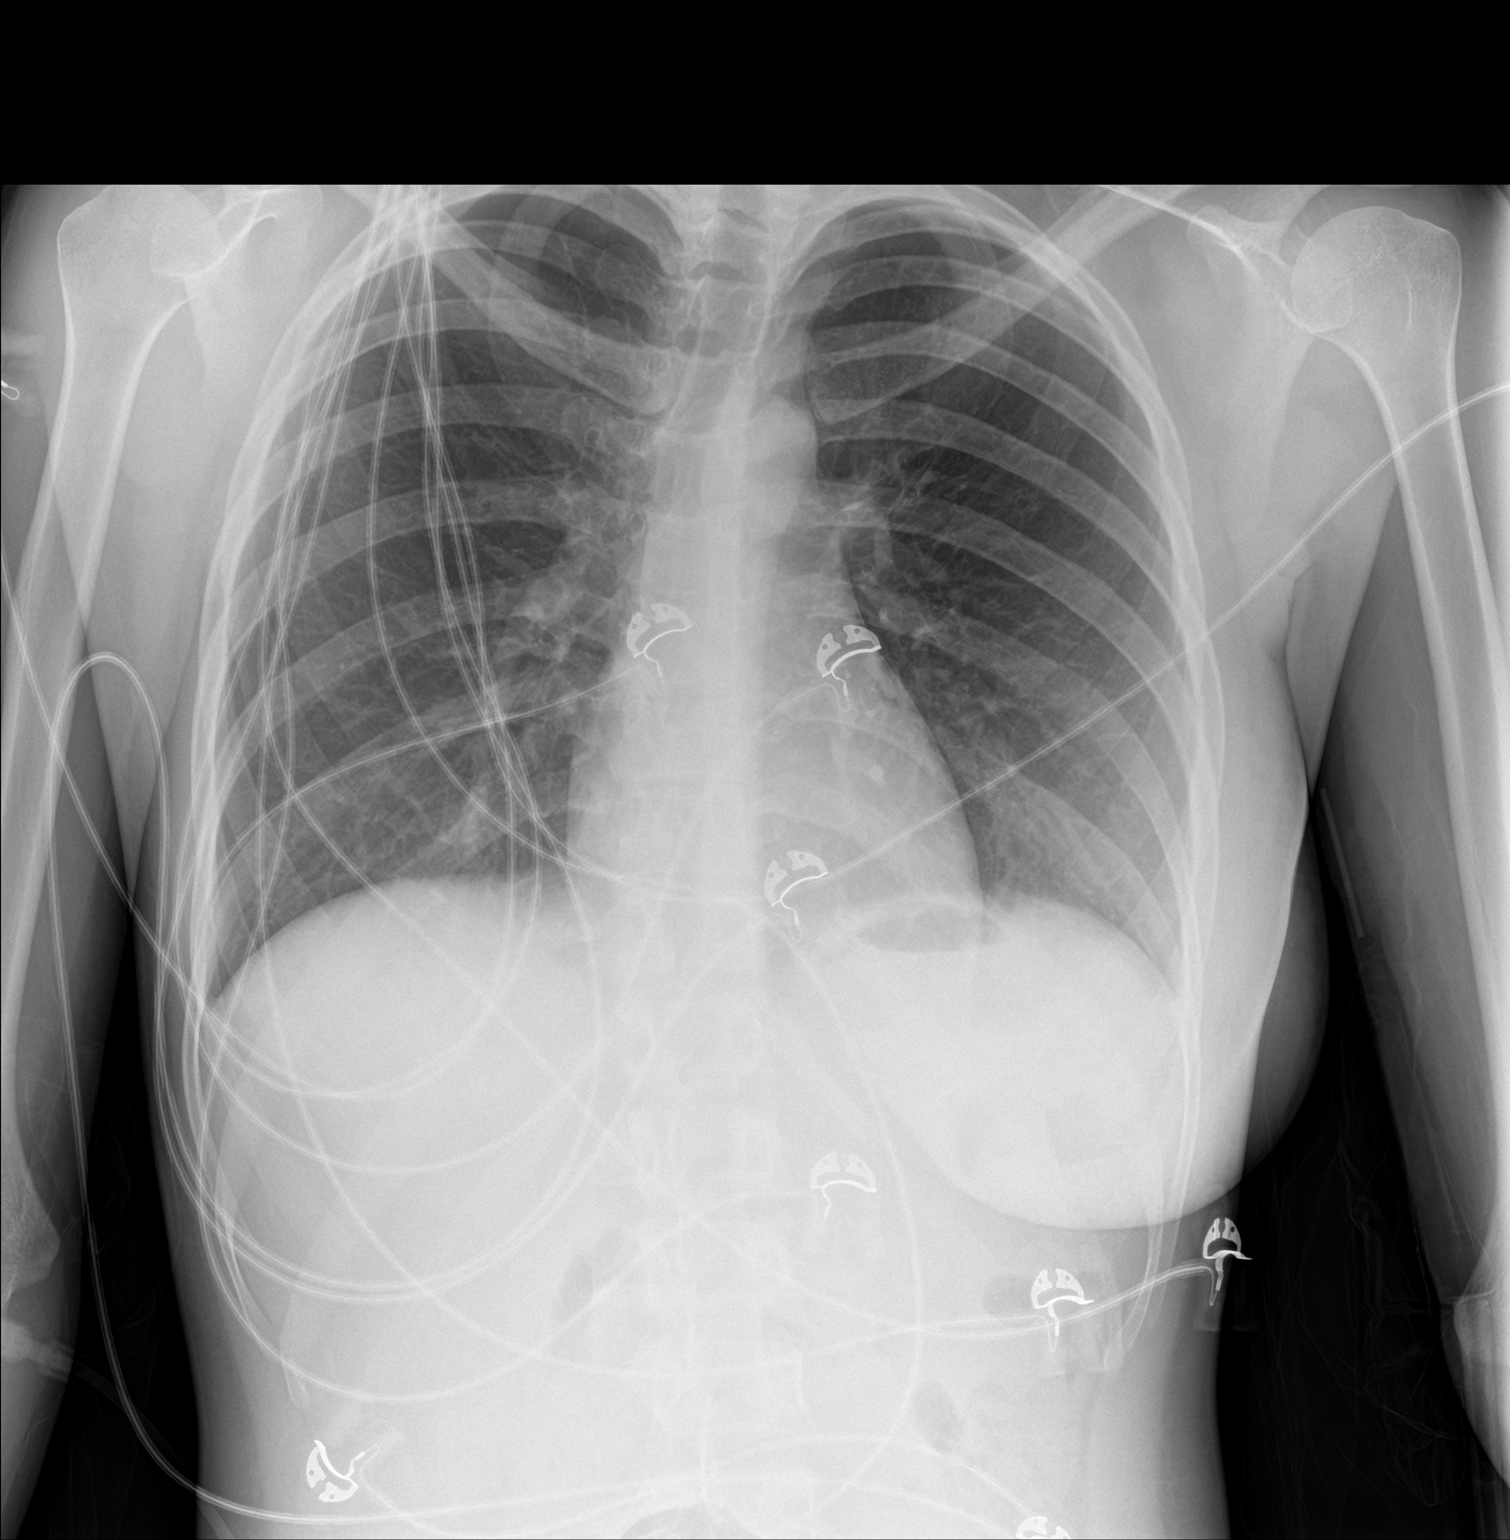

[chest lat]
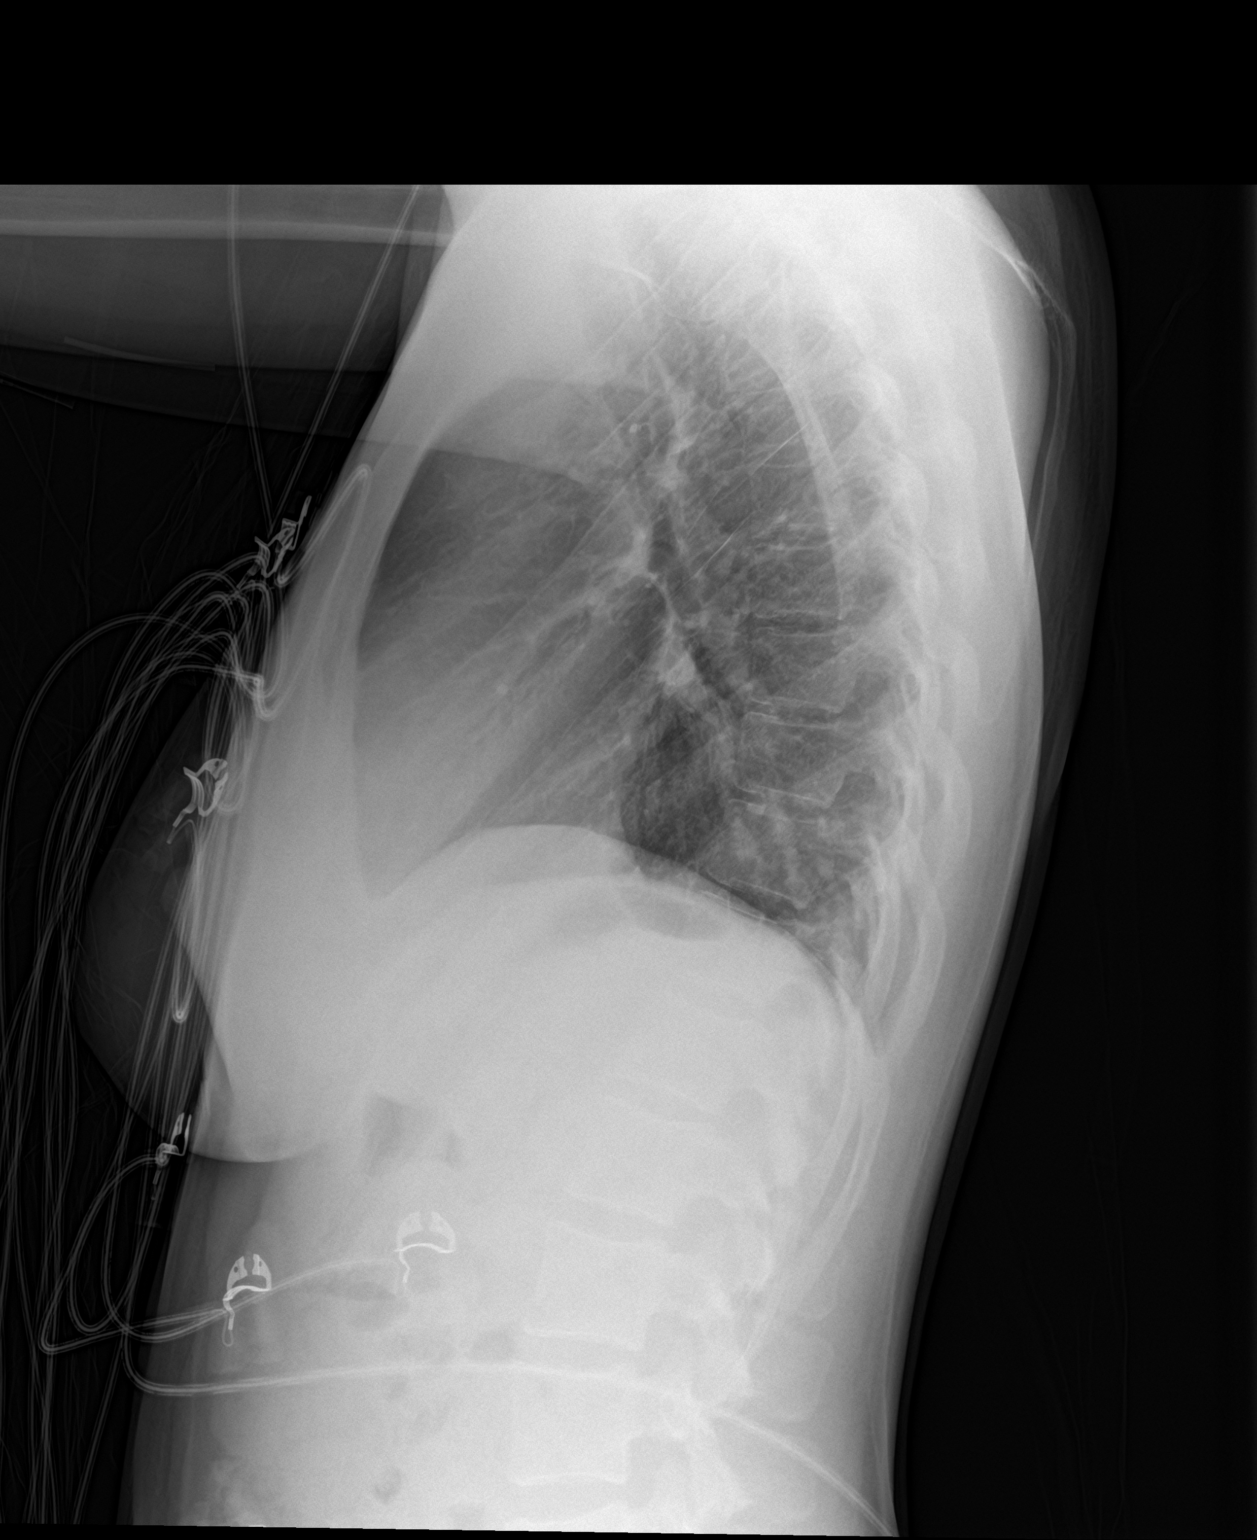

[2 of 2 positions shown; findings below may reference images not displayed]

FINDINGS: The cardiac and mediastinal silhouettes are within normal limits.

The lungs are normally inflated. Mild hazy and patchy left basilar
opacity, which may reflect atelectasis or infiltrate. No other focal
airspace disease. No pulmonary edema or pleural effusion. No
pneumothorax.

No acute osseous abnormality identified.
IMPRESSION: 1. Mild patchy and hazy left basilar opacity, which may reflect
atelectasis or infiltrate.
2. No other active cardiopulmonary disease.

## 2019-08-16 ENCOUNTER — Encounter: Payer: Self-pay | Admitting: Medical

## 2019-09-01 ENCOUNTER — Ambulatory Visit (INDEPENDENT_AMBULATORY_CARE_PROVIDER_SITE_OTHER): Payer: Medicaid Other | Admitting: Obstetrics & Gynecology

## 2019-09-01 ENCOUNTER — Other Ambulatory Visit: Payer: Self-pay

## 2019-09-01 ENCOUNTER — Encounter: Payer: Self-pay | Admitting: Obstetrics & Gynecology

## 2019-09-01 VITALS — BP 102/69 | HR 78 | Wt 144.0 lb

## 2019-09-01 DIAGNOSIS — Z3483 Encounter for supervision of other normal pregnancy, third trimester: Secondary | ICD-10-CM

## 2019-09-01 DIAGNOSIS — Z3A17 17 weeks gestation of pregnancy: Secondary | ICD-10-CM

## 2019-09-01 DIAGNOSIS — Z3480 Encounter for supervision of other normal pregnancy, unspecified trimester: Secondary | ICD-10-CM
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
## 2019-09-01 NOTE — Patient Instructions (Signed)

## 2019-09-02 ENCOUNTER — Encounter: Payer: Medicaid Other | Admitting: Medical

## 2019-09-03 LAB — AFP, SERUM, OPEN SPINA BIFIDA
AFP MoM: 0.76
AFP Value: 33.2 ng/mL
Gest. Age on Collection Date: 17.1 weeks
Maternal Age At EDD: 25.2 yr
OSBR Risk 1 IN: 10000
Test Results:: NEGATIVE
Weight: 144 [lb_av]

## 2019-09-11 ENCOUNTER — Other Ambulatory Visit: Payer: Self-pay

## 2019-09-11 ENCOUNTER — Encounter (HOSPITAL_COMMUNITY): Payer: Self-pay | Admitting: Family Medicine

## 2019-09-11 ENCOUNTER — Inpatient Hospital Stay (HOSPITAL_BASED_OUTPATIENT_CLINIC_OR_DEPARTMENT_OTHER): Payer: 59

## 2019-09-11 ENCOUNTER — Inpatient Hospital Stay (HOSPITAL_COMMUNITY)
Admission: AD | Admit: 2019-09-11 | Discharge: 2019-09-11 | Disposition: A | Discharge status: Home / Self Care | Payer: 59 | Attending: Family Medicine | Admitting: Family Medicine

## 2019-09-11 DIAGNOSIS — O209 Hemorrhage in early pregnancy, unspecified: Secondary | ICD-10-CM | POA: Diagnosis present

## 2019-09-11 DIAGNOSIS — Z3A18 18 weeks gestation of pregnancy: Secondary | ICD-10-CM | POA: Insufficient documentation

## 2019-09-11 DIAGNOSIS — O26892 Other specified pregnancy related conditions, second trimester: Secondary | ICD-10-CM | POA: Diagnosis not present

## 2019-09-11 DIAGNOSIS — Z853 Personal history of malignant neoplasm of breast: Secondary | ICD-10-CM | POA: Diagnosis not present

## 2019-09-11 DIAGNOSIS — Z888 Allergy status to other drugs, medicaments and biological substances status: Secondary | ICD-10-CM | POA: Insufficient documentation

## 2019-09-11 DIAGNOSIS — O368190 Decreased fetal movements, unspecified trimester, not applicable or unspecified: Secondary | ICD-10-CM | POA: Diagnosis not present

## 2019-09-11 DIAGNOSIS — R1084 Generalized abdominal pain: Secondary | ICD-10-CM

## 2019-09-11 DIAGNOSIS — Z87891 Personal history of nicotine dependence: Secondary | ICD-10-CM | POA: Insufficient documentation

## 2019-09-11 LAB — WET PREP, GENITAL
Clue Cells Wet Prep HPF POC: NONE SEEN
Sperm: NONE SEEN
Trich, Wet Prep: NONE SEEN
Yeast Wet Prep HPF POC: NONE SEEN

## 2019-09-11 NOTE — Discharge Instructions (Signed)
Preventing Preterm Birth Preterm birth is when your baby is delivered between 20 weeks and 37 weeks of pregnancy. A full-term pregnancy lasts for at least 37 weeks. Preterm birth can be dangerous for your baby because the last few weeks of pregnancy are an important time for your baby's brain and lungs to grow. Many things can cause a baby to be born early. Sometimes the cause is not known. There are certain factors that make you more likely to experience preterm birth, such as:  Having a previous baby born preterm.  Being pregnant with twins or other multiples.  Having had fertility treatment.  Being overweight or underweight at the start of your pregnancy.  Having any of the following during pregnancy: ? An infection, including a urinary tract infection (UTI) or an STI (sexually transmitted infection). ? High blood pressure. ? Diabetes. ? Vaginal bleeding.  Being age 35 or older.  Being age 18 or younger.  Getting pregnant within 6 months of a previous pregnancy.  Suffering extreme stress or physical or emotional abuse during pregnancy.  Standing for long periods of time during pregnancy, such as working at a job that requires standing. What are the risks? The most serious risk of preterm birth is that the baby may not survive. This is more likely to happen if a baby is born before 34 weeks. Other risks and complications of preterm birth may include your baby having:  Breathing problems.  Brain damage that affects movement and coordination (cerebral palsy).  Feeding difficulties.  Vision or hearing problems.  Infections or inflammation of the digestive tract (colitis).  Developmental delays.  Learning disabilities.  Higher risk for diabetes, heart disease, and high blood pressure later in life. What can I do to lower my risk?  Medical care The most important thing you can do to lower your risk for preterm birth is to get routine medical care during pregnancy (prenatal  care). If you have a high risk of preterm birth, you may be referred to a health care provider who specializes in managing high-risk pregnancies (perinatologist). You may be given medicine to help prevent preterm birth. Lifestyle changes Certain lifestyle changes can also lower your risk of preterm birth:  Wait at least 6 months after a pregnancy to become pregnant again.  Try to plan pregnancy for when you are between 19 and 35 years old.  Get to a healthy weight before getting pregnant. If you are overweight, work with your health care provider to safely lose weight.  Do not use any products that contain nicotine or tobacco, such as cigarettes and e-cigarettes. If you need help quitting, ask your health care provider.  Do not drink alcohol.  Do not use drugs. Where to find support For more support, consider:  Talking with your health care provider.  Talking with a therapist or substance abuse counselor, if you need help quitting.  Working with a diet and nutrition specialist (dietitian) or a personal trainer to maintain a healthy weight.  Joining a support group. Where to find more information Learn more about preventing preterm birth from:  Centers for Disease Control and Prevention: cdc.gov/reproductivehealth/maternalinfanthealth/pretermbirth.htm  March of Dimes: marchofdimes.org/complications/premature-babies.aspx  American Pregnancy Association: americanpregnancy.org/labor-and-birth/premature-labor Contact a health care provider if:  You have any of the following signs of preterm labor before 37 weeks: ? A change or increase in vaginal discharge. ? Fluid leaking from your vagina. ? Pressure or cramps in your lower abdomen. ? A backache that does not go away or gets worse. ?   Regular tightening (contractions) in your lower abdomen. Summary  Preterm birth means having your baby during weeks 20-37 of pregnancy.  Preterm birth may put your baby at risk for physical and  mental problems.  Getting good prenatal care can help prevent preterm birth.  You can lower your risk of preterm birth by making certain lifestyle changes, such as not smoking and not using alcohol. This information is not intended to replace advice given to you by your health care provider. Make sure you discuss any questions you have with your health care provider. Document Revised: 06/05/2017 Document Reviewed: 03/01/2016 Elsevier Patient Education  2020 Elsevier Inc.  

## 2019-09-11 NOTE — MAU Provider Note (Addendum)
[name] is a 24 y.o. G2P1001 at 63w4dhere with complaints of abdominal pain and vaginal bleeding after her three year old son stepped on her abdomen at 2 pm today. She denies nausea, dysuria, abnormal vaginal discharge. She has had an uncomplicated pregnancy thus far. She denies contractions, reports that the baby is moving a lot now in MAU.   History     CSN: [id number] Arrival date and time: 09/11/19 1603   None     Chief Complaint  Patient presents with  . Abdominal Pain  . Vaginal Bleeding  . Decreased Fetal Movement   Abdominal Pain This is a new problem. The current episode started today. The onset quality is sudden. The problem has been gradually improving. The pain is located in the generalized abdominal region. The pain is at a severity of 2/10. Quality: tender. The abdominal pain does not radiate.   She also reported that she had some bright red bleeding after the event; no clots, no contractions.  OB History    Gravida  2   Para  1   Term  1   Preterm  0   AB  0   Living  1     SAB  0   TAB  0   Ectopic  0   Multiple  0   Live Births  1           Past Medical History:  Diagnosis Date  . Anemia   . Asthma   . breast ca dx'd 05/2017   left  . Depression    no meds in 3 yrs  . Headache   . Infection    UTI  . Seizures [place]   "seizure like activity", reaction to medicaiton, none since    Past Surgical History:  Procedure Laterality Date  . ESOPHAGOGASTRODUODENOSCOPY (EGD) WITH PROPOFOL N/A 06/03/2017   Procedure: ESOPHAGOGASTRODUODENOSCOPY (EGD) WITH PROPOFOL;  Surgeon: SLadene Artist MD;  Location: [facility];  Service: Endoscopy;  Laterality: N/A;    Family History  Adopted: Yes  Problem Relation Age of Onset  . Mental illness Mother   . Cancer Mother   . Breast cancer Mother   . Cancer Maternal Grandmother   . Hypertension Maternal Grandmother   . Diabetes Maternal Grandmother   . Cancer Maternal  Aunt   . Breast cancer Maternal Aunt   . Breast cancer Sister   . Breast cancer Maternal Aunt   . Breast cancer Maternal Aunt   . Breast cancer Maternal Aunt   . Breast cancer Maternal Aunt     Social History   Tobacco Use  . Smoking status: Former Smoker    Types: Cigarettes    Quit date: 12/19/2014    Years since quitting: 4.7  . Smokeless tobacco: Never Used  . Tobacco comment: age 24 Substance Use Topics  . Alcohol use: Yes    Comment: occasionally  . Drug use: No    Allergies:  Allergies  Allergen Reactions  . Lexapro [Escitalopram] Other (See Comments)    Didn't like how it made her feel  . Lithium Other (See Comments)    SEIZURES, Childhood reaction    Medications Prior to Admission  Medication Sig Dispense Refill Last Dose  . Blood Pressure Monitoring (BLOOD PRESSURE KIT) DEVI 1 kit by Does not apply route once a week. Check Blood Pressure regularly and record readings into the Babyscripts App.  Large Cuff.  DX O90.0 1 each 0   .  omeprazole (PRILOSEC) 40 MG capsule Take 1 capsule (40 mg total) by mouth at bedtime. (Patient not taking: Reported on 09/01/2019) 30 capsule 2   . Prenatal Vit-Fe Fumarate-FA (PRENATAL PO) Take by mouth.       Review of Systems  Gastrointestinal: Positive for abdominal pain.   Physical Exam   Blood pressure 109/68, pulse 91, temperature 98.5 F (36.9 C), temperature source Oral, resp. rate 17, weight 66.5 kg, last menstrual period 05/04/2019, SpO2 97 %.  Physical Exam  Constitutional: She is oriented to person, place, and time. She appears well-developed.  HENT:  Head: Normocephalic.  Eyes: Pupils are equal, round, and reactive to light.  Respiratory: Effort normal.  GI: Soft.  Musculoskeletal:        General: Normal range of motion.     Cervical back: Normal range of motion.  Neurological: She is alert and oriented to person, place, and time.  Skin: Skin is warm.    MAU Course  Procedures  MDM Will send for [place] as  patient's bleeding seems to be excessive. At this point she declines her speculum exam per patient preference and reports that she is not having any bleeding.   Patient was complaining of cramping and anterior uterine contraction noted on [place], SVE done and patient is closed, long and thick. Speculum exam again deferred    Will place on toco and monitor for 30 minutes.   US shows no evidence of placental abruption.   Wet prep is negative.   Recheck after 30 minutes and patient is still closed; she reports good   fetal movements no contractions. Toco is quiescent.   FHR is 154 by doppler  Assessment and Plan   1. Generalized abdominal pain    2. Patient stable for discharge with plans to keep follow up appt this Friday.   3. Strict return precautions given, including change in bleeding and pain.   Mervyn Skeeters Ziggy Chanthavong 09/11/2019, 5:04 PM

## 2019-09-11 NOTE — MAU Note (Signed)
Michaela Williams is a 24 y.o. at [redacted]w[redacted]d here in MAU reporting:  States her toddler was running away from her mother who was trying to dress him. In doing so he stepped on her stomach. Afterwards started having vaginal bleeding. Is having to wear a panty liner. "heavy red at first then lightish pink" in color.  +lower right abdominal that is now dull and tender. Onset of complaint: around 2pm today Pain score: 4-5/10 +decreased fetal movement; last movement felt was this morning. Vitals:   09/11/19 1614  BP: 109/68  Pulse: 91  Resp: 17  Temp: 98.5 F (36.9 C)  SpO2: 97%    FHT: 154 via doppler Lab orders placed from triage: ua

## 2019-09-12 LAB — GC/CHLAMYDIA PROBE AMP (~~LOC~~) NOT AT ARMC
Chlamydia: NEGATIVE
Comment: NEGATIVE
Comment: NORMAL
Neisseria Gonorrhea: NEGATIVE

## 2019-09-14 ENCOUNTER — Encounter (HOSPITAL_COMMUNITY): Payer: Self-pay | Admitting: Family Medicine

## 2019-09-14 ENCOUNTER — Inpatient Hospital Stay (HOSPITAL_COMMUNITY)
Admission: AD | Admit: 2019-09-14 | Discharge: 2019-09-14 | Disposition: A | Discharge status: Home / Self Care | Payer: 59 | Attending: Family Medicine | Admitting: Family Medicine

## 2019-09-14 ENCOUNTER — Other Ambulatory Visit: Payer: Self-pay

## 2019-09-14 DIAGNOSIS — R1013 Epigastric pain: Secondary | ICD-10-CM | POA: Insufficient documentation

## 2019-09-14 DIAGNOSIS — O2342 Unspecified infection of urinary tract in pregnancy, second trimester: Secondary | ICD-10-CM

## 2019-09-14 DIAGNOSIS — Z3A19 19 weeks gestation of pregnancy: Secondary | ICD-10-CM | POA: Diagnosis not present

## 2019-09-14 DIAGNOSIS — R1030 Lower abdominal pain, unspecified: Secondary | ICD-10-CM | POA: Insufficient documentation

## 2019-09-14 DIAGNOSIS — O26892 Other specified pregnancy related conditions, second trimester: Secondary | ICD-10-CM | POA: Diagnosis not present

## 2019-09-14 DIAGNOSIS — B9689 Other specified bacterial agents as the cause of diseases classified elsewhere: Secondary | ICD-10-CM | POA: Diagnosis not present

## 2019-09-14 LAB — COMPREHENSIVE METABOLIC PANEL
ALT: 12 U/L (ref 0–44)
AST: 13 U/L — ABNORMAL LOW (ref 15–41)
Albumin: 2.9 g/dL — ABNORMAL LOW (ref 3.5–5.0)
Alkaline Phosphatase: 44 U/L (ref 38–126)
Anion gap: 8 (ref 5–15)
BUN: <5 mg/dL — ABNORMAL LOW (ref 6–20)
CO2: 21 mmol/L — ABNORMAL LOW (ref 22–32)
Calcium: 9.1 mg/dL (ref 8.9–10.3)
Chloride: 106 mmol/L (ref 98–111)
Creatinine, Ser: 0.51 mg/dL (ref 0.44–1.00)
GFR calc Af Amer: >60 mL/min (ref >60)
GFR calc non Af Amer: >60 mL/min (ref >60)
Glucose, Bld: 108 mg/dL — ABNORMAL HIGH (ref 70–99)
Potassium: 3.3 mmol/L — ABNORMAL LOW (ref 3.5–5.1)
Sodium: 135 mmol/L (ref 135–145)
Total Bilirubin: 0.4 mg/dL (ref 0.3–1.2)
Total Protein: 5.8 g/dL — ABNORMAL LOW (ref 6.5–8.1)

## 2019-09-14 LAB — URINALYSIS, ROUTINE W REFLEX MICROSCOPIC
Bilirubin Urine: NEGATIVE
Glucose, UA: NEGATIVE mg/dL
Hgb urine dipstick: NEGATIVE
Ketones, ur: NEGATIVE mg/dL
Nitrite: NEGATIVE
Protein, ur: NEGATIVE mg/dL
Specific Gravity, Urine: 1.003 — ABNORMAL LOW (ref 1.005–1.030)
pH: 7 (ref 5.0–8.0)

## 2019-09-14 LAB — CBC WITH DIFFERENTIAL/PLATELET
Abs Immature Granulocytes: 0.06 10*3/uL (ref 0.00–0.07)
Basophils Absolute: 0 10*3/uL (ref 0.0–0.1)
Basophils Relative: 0 %
Eosinophils Absolute: 0 10*3/uL (ref 0.0–0.5)
Eosinophils Relative: 0 %
HCT: 33.1 % — ABNORMAL LOW (ref 36.0–46.0)
Hemoglobin: 11.4 g/dL — ABNORMAL LOW (ref 12.0–15.0)
Immature Granulocytes: 1 %
Lymphocytes Relative: 13 %
Lymphs Abs: 1.7 10*3/uL (ref 0.7–4.0)
MCH: 31.2 pg (ref 26.0–34.0)
MCHC: 34.4 g/dL (ref 30.0–36.0)
MCV: 90.7 fL (ref 80.0–100.0)
Monocytes Absolute: 1 10*3/uL (ref 0.1–1.0)
Monocytes Relative: 7 %
Neutro Abs: 10.5 10*3/uL — ABNORMAL HIGH (ref 1.7–7.7)
Neutrophils Relative %: 79 %
Platelets: 242 10*3/uL (ref 150–400)
RBC: 3.65 MIL/uL — ABNORMAL LOW (ref 3.87–5.11)
RDW: 12.4 % (ref 11.5–15.5)
WBC: 13.2 10*3/uL — ABNORMAL HIGH (ref 4.0–10.5)
nRBC: 0 % (ref 0.0–0.2)

## 2019-09-14 MED ORDER — PHENAZOPYRIDINE HCL 100 MG PO TABS
200.0000 mg | ORAL_TABLET | Freq: Once | ORAL | Status: AC
  Administered 2019-09-14: 200 mg via ORAL
  Filled 2019-09-14: qty 2

## 2019-09-14 MED ORDER — PHENAZOPYRIDINE HCL 200 MG PO TABS: 200.0000 mg | ORAL_TABLET | Freq: Three times a day (TID) | ORAL | 0 refills | Status: DC | PRN

## 2019-09-14 MED ORDER — SULFAMETHOXAZOLE-TRIMETHOPRIM 800-160 MG PO TABS
1.0000 | ORAL_TABLET | Freq: Once | ORAL | Status: AC
  Administered 2019-09-14: 1 via ORAL
  Filled 2019-09-14: qty 1

## 2019-09-14 MED ORDER — SULFAMETHOXAZOLE-TRIMETHOPRIM 800-160 MG PO TABS: 1.0000 | ORAL_TABLET | Freq: Two times a day (BID) | ORAL | 0 refills | Status: DC

## 2019-09-14 NOTE — MAU Note (Signed)
Michaela Williams is a 24 y.o. at [redacted]w[redacted]d here in MAU reporting: was here the other for bleeding and found out she was having contractions. Is now having constant lower and upper abdominal pain and also constant lower back pain. Has not tried any medication.   Onset of complaint: since Monday  Pain score: upper abdominal pain 5/10, lower abdominal pain 6/10, back pain 7/10  Vitals:   09/14/19 1756  BP: 106/66  Pulse: (!) 104  Resp: 17  Temp: 98.9 F (37.2 C)  SpO2: 99%     FHT: 152  Lab orders placed from triage: UA

## 2019-09-14 NOTE — Discharge Instructions (Signed)
Abdominal Pain During Pregnancy  Abdominal pain is common during pregnancy, and has many possible causes. Some causes are more serious than others, and sometimes the cause is not known. Abdominal pain can be a sign that labor is starting. It can also be caused by normal growth and stretching of muscles and ligaments during pregnancy. Always tell your health care provider if you have any abdominal pain. Follow these instructions at home:  Do not have sex or put anything in your vagina until your pain goes away completely.  Get plenty of rest until your pain improves.  Drink enough fluid to keep your urine pale yellow.  Take over-the-counter and prescription medicines only as told by your health care provider.  Keep all follow-up visits as told by your health care provider. This is important. Contact a health care provider if:  Your pain continues or gets worse after resting.  You have lower abdominal pain that: ? Comes and goes at regular intervals. ? Spreads to your back. ? Is similar to menstrual cramps.  You have pain or burning when you urinate. Get help right away if:  You have a fever or chills.  You have vaginal bleeding.  You are leaking fluid from your vagina.  You are passing tissue from your vagina.  You have vomiting or diarrhea that lasts for more than 24 hours.  Your baby is moving less than usual.  You feel very weak or faint.  You have shortness of breath.  You develop severe pain in your upper abdomen. Summary  Abdominal pain is common during pregnancy, and has many possible causes.  If you experience abdominal pain during pregnancy, tell your health care provider right away.  Follow your health care provider's home care instructions and keep all follow-up visits as directed. This information is not intended to replace advice given to you by your health care provider. Make sure you discuss any questions you have with your health care  provider. Document Revised: 10/11/2018 Document Reviewed: 09/25/2016 Elsevier Patient Education  2020 [place].   Pregnancy and Urinary Tract Infection  A urinary tract infection (UTI) is an infection of any part of the urinary tract. This includes the kidneys, the tubes that connect your kidneys to your bladder (ureters), the bladder, and the tube that carries urine out of your body (urethra). These organs make, store, and get rid of urine in the body. Your health care provider may use other names to describe the infection. An upper UTI affects the ureters and kidneys (pyelonephritis). A lower UTI affects the bladder (cystitis) and urethra (urethritis). Most urinary tract infections are caused by bacteria in your genital area, around the entrance to your urinary tract (urethra). These bacteria grow and cause irritation and inflammation of your urinary tract. You are more likely to develop a UTI during pregnancy because the physical and hormonal changes your body goes through can make it easier for bacteria to get into your urinary tract. Your growing baby also puts pressure on your bladder and can affect urine flow. It is important to recognize and treat UTIs in pregnancy because of the risk of serious complications for both you and your baby. How does this affect me? Symptoms of a UTI include:  Needing to urinate right away (urgently).  Frequent urination or passing small amounts of urine frequently.  Pain or burning with urination.  Blood in the urine.  Urine that smells bad or unusual.  Trouble urinating.  Cloudy urine.  Pain in the abdomen  or lower back.  Vaginal discharge. You may also have:  Vomiting or a decreased appetite.  Confusion.  Irritability or tiredness.  A fever.  Diarrhea. How does this affect my baby? An untreated UTI during pregnancy could lead to a kidney infection or a systemic infection, which can cause health problems that could affect your baby.  Possible complications of an untreated UTI include:  Giving birth to your baby before 37 weeks of pregnancy (premature).  Having a baby with a low birth weight.  Developing high blood pressure during pregnancy (preeclampsia).  Having a low hemoglobin level (anemia). What can I do to lower my risk? To prevent a UTI:  Go to the bathroom as soon as you feel the need. Do not hold urine for long periods of time.  Always wipe from front to back, especially after a bowel movement. Use each tissue one time when you wipe.  Empty your bladder after sex.  Keep your genital area dry.  Drink 6-10 glasses of water each day.  Do not douche or use deodorant sprays. How is this treated? Treatment for this condition may include:  Antibiotic medicines that are safe to take during pregnancy.  Other medicines to treat less common causes of UTI. Follow these instructions at home:  If you were prescribed an antibiotic medicine, take it as told by your health care provider. Do not stop using the antibiotic even if you start to feel better.  Keep all follow-up visits as told by your health care provider. This is important. Contact a health care provider if:  Your symptoms do not improve or they get worse.  You have abnormal vaginal discharge. Get help right away if you:  Have a fever.  Have nausea and vomiting.  Have back or side pain.  Feel contractions in your uterus.  Have lower belly pain.  Have a gush of fluid from your vagina.  Have blood in your urine. Summary  A urinary tract infection (UTI) is an infection of any part of the urinary tract, which includes the kidneys, ureters, bladder, and urethra.  Most urinary tract infections are caused by bacteria in your genital area, around the entrance to your urinary tract (urethra).  You are more likely to develop a UTI during pregnancy.  If you were prescribed an antibiotic medicine, take it as told by your health care provider.  Do not stop using the antibiotic even if you start to feel better. This information is not intended to replace advice given to you by your health care provider. Make sure you discuss any questions you have with your health care provider. Document Revised: 10/15/2018 Document Reviewed: 05/27/2018 Elsevier Patient Education  [place].

## 2019-09-14 NOTE — MAU Provider Note (Signed)
CC:  Chief Complaint  Patient presents with  . Abdominal Pain  . Back Pain     First Provider Initiated Contact with Patient 09/14/19 1823      HPI: MAGDELINE Williams is a 24 y.o. year old G24P1001 female at 48w0dweeks gestation who presents to MAU reporting severe low abd pain that she thinks may be contractions and constant epigastric pain today. Was seen in MAU 09/12/19 for abd pain and vaginal bleeding after her 211year-old child walked across her abdomen.  Reports that pain is similar but is now more pronounced in her upper abdomen.  Has not tried anything to treat the pain.  CL at last MAU visit was 3.4 cm.   Fetal movement: Nml  Associated Sx: Negative for fever, chills, nausea, vomiting, diarrhea, constipation, vaginal bleeding, vaginal discharge, leaking of fluid, hematuria, urinary urgency or frequency.  Positive for dysuria and radiation of pain in 2 right low/mid back.   O:  Patient Vitals for the past 24 hrs:  BP Temp Temp src Pulse Resp SpO2 Height Weight  09/14/19 1756 106/66 98.9 F (37.2 C) Oral (!) 104 17 99 % - -  09/14/19 1750 - - - - - - _0  (1.549 m) 66.7 kg    General: NAD, extremely anxious. Heart: Mild tachycardia Lungs: Normal rate and effort Abd: Soft, patient guarding prior to palpation of abdomen.  Tender across entire upper abdomen and entire low abdomen.  Negative rebound tenderness.  Positive bowel sounds. Gravid, S=D Pelvic: NEFG, no leaking of fluid or blood on cervical exam.  Dilation: Closed/long Exam by:: VManya Silvas CNM  Fetal heart rate 152 by Doppler  Orders Placed This Encounter  Procedures  . Culture, OB Urine  . Urinalysis, Routine w reflex microscopic  . CBC with Differential/Platelet  . Comprehensive metabolic panel  . Apply cold compresses   Meds ordered this encounter  Medications  . sulfamethoxazole-trimethoprim (BACTRIM DS) 800-160 MG per tablet 1 tablet  . phenazopyridine (PYRIDIUM) tablet 200 mg  .  sulfamethoxazole-trimethoprim (BACTRIM DS) 800-160 MG tablet    Sig: Take 1 tablet by mouth 2 (two) times daily.    Dispense:  14 tablet    Refill:  0    Order Specific Question:   Supervising Provider    Answer:Debbrah Alar[[id number] . phenazopyridine (PYRIDIUM) 200 MG tablet    Sig: Take 1 tablet (200 mg total) by mouth 3 (three) times daily as needed for pain.    Dispense:  12 tablet    Refill:  0    Order Specific Question:   Supervising Provider    Answer:   PDebbrah Alar[[[id number]]  MDM - Generalized abdominal pain and pregnancy possibly due to a combination of possible UTI and recent mild abdominal trauma.  Will treat for UTI based on dysuria and leukocytes on UA with Bactrim and Pyridium.  No evidence of abruption, pyelonephritis or other emergent condition.  Mild leukocytosis likely related to gravid state.  Declined pain meds.   A: 196w0deek IUP 1. Abdominal pain during pregnancy, second trimester   2. UTI (urinary tract infection) during pregnancy, second trimester   3. Epigastric abdominal pain affecting pregnancy in second trimester    P: Discharge home in stable condition. Preterm labor precautions and fetal kick counts. Urine culture pending Push fluids. Follow-up Information    [facility]   Why: as scheduled or sooner as needed if symptoms worsen Contact information: [address] [place] [address] [phone]       [facility] Follow up.   Specialty: Obstetrics and Gynecology Why: as needed in pregnancy emergencies Contact information: [address] [phone] [place] [phone]         Allergies as of 09/14/2019      Reactions   Lexapro [escitalopram] Other (See Comments)   Didn't like how it made her feel   Lithium Other (See Comments)   SEIZURES, Childhood reaction      Medication List    TAKE these medications    Blood Pressure Kit Devi 1 kit by Does not apply route once a week. Check Blood Pressure regularly and record readings into the Babys[employer]s App.  Large Cuff.  DX O90.0   omeprazole 40 MG capsule Commonly known as: PRILOSEC Take 1 capsule (40 mg total) by mouth at bedtime.   phenazopyridine 200 MG tablet Commonly known as: PYRIDIUM Take 1 tablet (200 mg total) by mouth 3 (three) times daily as needed for pain.   PRENATAL PO Take by mouth.   sulfamethoxazole-trimethoprim 800-160 MG tablet Commonly known as: BACTRIM DS Take 1 tablet by mouth 2 (two) times daily.       Michaela Williams, [place], [place] 09/14/2019 9:54 PM  3

## 2019-09-15 LAB — CULTURE, OB URINE: Special Requests: NORMAL

## 2019-09-16 ENCOUNTER — Ambulatory Visit (HOSPITAL_COMMUNITY)
Admission: RE | Admit: 2019-09-16 | Discharge: 2019-09-16 | Disposition: A | Discharge status: Home / Self Care | Payer: 59 | Source: Ambulatory Visit | Attending: Obstetrics and Gynecology | Admitting: Obstetrics and Gynecology

## 2019-09-16 ENCOUNTER — Other Ambulatory Visit: Payer: Self-pay

## 2019-09-16 DIAGNOSIS — Z363 Encounter for antenatal screening for malformations: Secondary | ICD-10-CM

## 2019-09-16 DIAGNOSIS — Z3A19 19 weeks gestation of pregnancy: Secondary | ICD-10-CM

## 2019-09-16 DIAGNOSIS — Z3480 Encounter for supervision of other normal pregnancy, unspecified trimester: Secondary | ICD-10-CM | POA: Diagnosis not present

## 2019-09-29 ENCOUNTER — Other Ambulatory Visit: Payer: Self-pay

## 2019-09-29 ENCOUNTER — Ambulatory Visit (INDEPENDENT_AMBULATORY_CARE_PROVIDER_SITE_OTHER): Payer: 59

## 2019-09-29 VITALS — BP 106/76 | Wt 144.0 lb

## 2019-09-29 DIAGNOSIS — Z3009 Encounter for other general counseling and advice on contraception: Secondary | ICD-10-CM

## 2019-09-29 DIAGNOSIS — Z3482 Encounter for supervision of other normal pregnancy, second trimester: Secondary | ICD-10-CM

## 2019-09-29 DIAGNOSIS — Z3A21 21 weeks gestation of pregnancy: Secondary | ICD-10-CM

## 2019-09-29 DIAGNOSIS — Z8659 Personal history of other mental and behavioral disorders: Secondary | ICD-10-CM

## 2019-09-29 DIAGNOSIS — Z3480 Encounter for supervision of other normal pregnancy, unspecified trimester: Secondary | ICD-10-CM

## 2019-09-29 NOTE — Progress Notes (Signed)
Patient reports fetal movement, denies pain. 

## 2019-09-29 NOTE — Patient Instructions (Signed)
Contraception Choices Contraception, also called birth control, refers to methods or devices that prevent pregnancy. Hormonal methods Contraceptive implant  A contraceptive implant is a thin, plastic tube that contains a hormone. It is inserted into the upper part of the arm. It can remain in place for up to 3 years. Progestin-only injections Progestin-only injections are injections of progestin, a synthetic form of the hormone progesterone. They are given every 3 months by a health care provider. Birth control pills  Birth control pills are pills that contain hormones that prevent pregnancy. They must be taken once a day, preferably at the same time each day. Birth control patch  The birth control patch contains hormones that prevent pregnancy. It is placed on the skin and must be changed once a week for three weeks and removed on the fourth week. A prescription is needed to use this method of contraception. Vaginal ring  A vaginal ring contains hormones that prevent pregnancy. It is placed in the vagina for three weeks and removed on the fourth week. After that, the process is repeated with a new ring. A prescription is needed to use this method of contraception. Emergency contraceptive Emergency contraceptives prevent pregnancy after unprotected sex. They come in pill form and can be taken up to 5 days after sex. They work best the sooner they are taken after having sex. Most emergency contraceptives are available without a prescription. This method should not be used as your only form of birth control. Barrier methods Female condom  A female condom is a thin sheath that is worn over the penis during sex. Condoms keep sperm from going inside a woman's body. They can be used with a spermicide to increase their effectiveness. They should be disposed after a single use. Female condom  A female condom is a soft, loose-fitting sheath that is put into the vagina before sex. The condom keeps sperm  from going inside a woman's body. They should be disposed after a single use. Diaphragm  A diaphragm is a soft, dome-shaped barrier. It is inserted into the vagina before sex, along with a spermicide. The diaphragm blocks sperm from entering the uterus, and the spermicide kills sperm. A diaphragm should be left in the vagina for 6-8 hours after sex and removed within 24 hours. A diaphragm is prescribed and fitted by a health care provider. A diaphragm should be replaced every 1-2 years, after giving birth, after gaining more than 15 lb (6.8 kg), and after pelvic surgery. Cervical cap  A cervical cap is a round, soft latex or plastic cup that fits over the cervix. It is inserted into the vagina before sex, along with spermicide. It blocks sperm from entering the uterus. The cap should be left in place for 6-8 hours after sex and removed within 48 hours. A cervical cap must be prescribed and fitted by a health care provider. It should be replaced every 2 years. Sponge  A sponge is a soft, circular piece of polyurethane foam with spermicide on it. The sponge helps block sperm from entering the uterus, and the spermicide kills sperm. To use it, you make it wet and then insert it into the vagina. It should be inserted before sex, left in for at least 6 hours after sex, and removed and thrown away within 30 hours. Spermicides Spermicides are chemicals that kill or block sperm from entering the cervix and uterus. They can come as a cream, jelly, suppository, foam, or tablet. A spermicide should be inserted into the   vagina with an applicator at least 10-15 minutes before sex to allow time for it to work. The process must be repeated every time you have sex. Spermicides do not require a prescription. Intrauterine contraception Intrauterine device (IUD) An IUD is a T-shaped device that is put in a woman's uterus. There are two types:  Hormone IUD.This type contains progestin, a synthetic form of the hormone  progesterone. This type can stay in place for 3-5 years.  Copper IUD.This type is wrapped in copper wire. It can stay in place for 10 years.  Permanent methods of contraception Female tubal ligation In this method, a woman's fallopian tubes are sealed, tied, or blocked during surgery to prevent eggs from traveling to the uterus. Hysteroscopic sterilization In this method, a small, flexible insert is placed into each fallopian tube. The inserts cause scar tissue to form in the fallopian tubes and block them, so sperm cannot reach an egg. The procedure takes about 3 months to be effective. Another form of birth control must be used during those 3 months. Female sterilization This is a procedure to tie off the tubes that carry sperm (vasectomy). After the procedure, the man can still ejaculate fluid (semen). Natural planning methods Natural family planning In this method, a couple does not have sex on days when the woman could become pregnant. Calendar method This means keeping track of the length of each menstrual cycle, identifying the days when pregnancy can happen, and not having sex on those days. Ovulation method In this method, a couple avoids sex during ovulation. Symptothermal method This method involves not having sex during ovulation. The woman typically checks for ovulation by watching changes in her temperature and in the consistency of cervical mucus. Post-ovulation method In this method, a couple waits to have sex until after ovulation. Summary  Contraception, also called birth control, means methods or devices that prevent pregnancy.  Hormonal methods of contraception include implants, injections, pills, patches, vaginal rings, and emergency contraceptives.  Barrier methods of contraception can include female condoms, female condoms, diaphragms, cervical caps, sponges, and spermicides.  There are two types of IUDs (intrauterine devices). An IUD can be put in a woman's uterus to  prevent pregnancy for 3-5 years.  Permanent sterilization can be done through a procedure for males, females, or both.  Natural family planning methods involve not having sex on days when the woman could become pregnant. This information is not intended to replace advice given to you by your health care provider. Make sure you discuss any questions you have with your health care provider. Document Revised: 06/25/2017 Document Reviewed: 07/26/2016 Elsevier Patient Education  2020 Elsevier Inc.  

## 2019-09-29 NOTE — Progress Notes (Signed)
   LOW-RISK PREGNANCY OFFICE VISIT  Patient name: Michaela Williams MRN [id number]  Date of birth: January 29, 1996 Chief Complaint:   Routine Prenatal Visit  Subjective:   Michaela Williams is a 24 y.o. G32P1001 female at [redacted]w[redacted]d with an Estimated Date of Delivery: 02/08/20 being seen today for ongoing management of a low-risk pregnancy.  Today she reports no complaints. Contractions: Not present. Vag. Bleeding: None.  Movement: Present. Patient denies abdominal cramping or contractions.  She denies vaginal concerns including discharge, bleeding, and leaking of fluid.  She endorses fetal movement.   Patient reports that her appetite had decreased and she has some bouts of dizziness.  She states that her anxiety "is not too bad."  Patient reports that she lost her job in January and is coping well, she further reports that she may be "in survival mode."  Patient endorses that she has access to food through assistance from her parents. Patient does report increased nausea and states she has been drinking "naked drinks with kale and ginger."  She states she doesn't like taking medication "so much."    Reviewed past medical,surgical, social, obstetrical and family history as well as problem list, medications and allergies.  Objective   Vitals:   09/29/19 1409  BP: 106/76  Weight: 144 lb (65.3 kg)  Body mass index is 27.21 kg/m.        Physical Examination:   General appearance: Well appearing, and in no distress  Mental status: Alert, oriented to person, place, and time  Skin: Warm & dry  Cardiovascular: Normal heart rate noted  Respiratory: Normal respiratory effort, no distress  Abdomen: Soft, gravid, nontender, Fundus at umbilicus   Pelvic: Cervical exam deferred           Extremities: Edema: Trace  Fetal Status: Fetal Heart Rate (bpm): 159  Movement: Present   No results found for this or any previous visit (from the past 24 hour(s)).  Assessment & Plan:  Low-risk pregnancy of a 24 y.o., G2P1001  at [redacted]w[redacted]d with an Estimated Date of Delivery: 02/08/20   1. Supervision of other normal pregnancy, antepartum -Discussed ways to increase appetite particularly treating nausea. -Discussed sources of protein and homeopathic ways to treat nausea including ginger ale, sugared ginger, and small frequent meals. -Anticipatory guidance for upcoming appts.   2. History of anxiety -Coping well. -Declines to speak with [facility] specialist. -Patient visibly triggered during fundal height measurement aeb rapid breathing, tense posture, and becoming momentarily tearful.  Patient recovered with provider prompting to breath and reassurances of safety.   3. Birth control counseling -Discussed birth control methods for postpartum period. -Patient does not desire Nexplanon again. -Reviewed PPIUD and patient expresses desire.  Patient reports desire for monthly menses. -Given information pamphlets for Liletta and Paragard.     Meds: No orders of the defined types were placed in this encounter.  Labs/procedures today:  Lab Orders  No laboratory test(s) ordered today     Reviewed: Preterm labor symptoms and general obstetric precautions including but not limited to vaginal bleeding, contractions, leaking of fluid and fetal movement were reviewed in detail with the patient.  All questions were answered.  Follow-up: Return in about 3 weeks (around 10/20/2019) for LR-ROB via VV.  No orders of the defined types were placed in this encounter.  [name] MSN, CNM 09/29/2019

## 2019-10-19 ENCOUNTER — Telehealth (INDEPENDENT_AMBULATORY_CARE_PROVIDER_SITE_OTHER): Payer: 59 | Admitting: Obstetrics

## 2019-10-19 ENCOUNTER — Encounter: Payer: Self-pay | Admitting: Obstetrics

## 2019-10-19 DIAGNOSIS — F314 Bipolar disorder, current episode depressed, severe, without psychotic features: Secondary | ICD-10-CM

## 2019-10-19 DIAGNOSIS — O99342 Other mental disorders complicating pregnancy, second trimester: Secondary | ICD-10-CM

## 2019-10-19 DIAGNOSIS — Z3A24 24 weeks gestation of pregnancy: Secondary | ICD-10-CM

## 2019-10-19 DIAGNOSIS — Z3480 Encounter for supervision of other normal pregnancy, unspecified trimester: Secondary | ICD-10-CM

## 2019-10-19 NOTE — Progress Notes (Signed)
Pt states she is in a lot of pain.  Pt had 2-3 days of spotting with cramping. Pt states that she is doing better now.   Pt does not have BP cuff.
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
## 2019-11-16 ENCOUNTER — Ambulatory Visit (INDEPENDENT_AMBULATORY_CARE_PROVIDER_SITE_OTHER): Payer: 59 | Admitting: Obstetrics

## 2019-11-16 ENCOUNTER — Encounter: Payer: Self-pay | Admitting: Obstetrics

## 2019-11-16 ENCOUNTER — Other Ambulatory Visit: Payer: Self-pay

## 2019-11-16 ENCOUNTER — Other Ambulatory Visit: Payer: 59

## 2019-11-16 VITALS — BP 114/75 | HR 118 | Wt 152.0 lb

## 2019-11-16 DIAGNOSIS — Z23 Encounter for immunization: Secondary | ICD-10-CM | POA: Diagnosis not present

## 2019-11-16 DIAGNOSIS — Z3A28 28 weeks gestation of pregnancy: Secondary | ICD-10-CM

## 2019-11-16 DIAGNOSIS — M549 Dorsalgia, unspecified: Secondary | ICD-10-CM

## 2019-11-16 DIAGNOSIS — Z3480 Encounter for supervision of other normal pregnancy, unspecified trimester: Secondary | ICD-10-CM

## 2019-11-16 DIAGNOSIS — O99891 Other specified diseases and conditions complicating pregnancy: Secondary | ICD-10-CM

## 2019-11-16 DIAGNOSIS — F314 Bipolar disorder, current episode depressed, severe, without psychotic features: Secondary | ICD-10-CM

## 2019-11-16 DIAGNOSIS — O99343 Other mental disorders complicating pregnancy, third trimester: Secondary | ICD-10-CM

## 2019-11-16 MED ORDER — COMFORT FIT MATERNITY SUPP SM MISC: 0 refills | Status: DC
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
## 2019-11-16 NOTE — Progress Notes (Signed)
ROB  2hr GTT.  CC: Lower Pelvic pressure. Acid Reflux.  Pt notes hyperpigmentation on her neck.   Pt interested in support belt.

## 2019-11-17 LAB — CBC
Hematocrit: 33.9 % — ABNORMAL LOW (ref 34.0–46.6)
Hemoglobin: 11.4 g/dL (ref 11.1–15.9)
MCH: 30.8 pg (ref 26.6–33.0)
MCHC: 33.6 g/dL (ref 31.5–35.7)
MCV: 92 fL (ref 79–97)
Platelets: 258 10*3/uL (ref 150–450)
RBC: 3.7 x10E6/uL — ABNORMAL LOW (ref 3.77–5.28)
RDW: 12.4 % (ref 11.7–15.4)
WBC: 14.3 10*3/uL — ABNORMAL HIGH (ref 3.4–10.8)

## 2019-11-17 LAB — GLUCOSE TOLERANCE, 2 HOURS W/ 1HR
Glucose, 1 hour: 123 mg/dL (ref 65–179)
Glucose, 2 hour: 122 mg/dL (ref 65–152)
Glucose, Fasting: 91 mg/dL (ref 65–91)

## 2019-11-17 LAB — RPR: RPR Ser Ql: NONREACTIVE

## 2019-11-17 LAB — HIV ANTIBODY (ROUTINE TESTING W REFLEX): HIV Screen 4th Generation wRfx: NONREACTIVE

## 2019-11-21 ENCOUNTER — Encounter (HOSPITAL_COMMUNITY): Payer: Self-pay | Admitting: Obstetrics and Gynecology

## 2019-11-21 ENCOUNTER — Inpatient Hospital Stay (HOSPITAL_COMMUNITY)
Admission: AD | Admit: 2019-11-21 | Discharge: 2019-11-21 | Disposition: A | Discharge status: Home / Self Care | Payer: 59 | Attending: Obstetrics and Gynecology | Admitting: Obstetrics and Gynecology

## 2019-11-21 ENCOUNTER — Inpatient Hospital Stay (HOSPITAL_BASED_OUTPATIENT_CLINIC_OR_DEPARTMENT_OTHER): Payer: 59

## 2019-11-21 ENCOUNTER — Other Ambulatory Visit: Payer: Self-pay

## 2019-11-21 DIAGNOSIS — Z87440 Personal history of urinary (tract) infections: Secondary | ICD-10-CM | POA: Diagnosis not present

## 2019-11-21 DIAGNOSIS — O26893 Other specified pregnancy related conditions, third trimester: Secondary | ICD-10-CM | POA: Diagnosis not present

## 2019-11-21 DIAGNOSIS — O321XX0 Maternal care for breech presentation, not applicable or unspecified: Secondary | ICD-10-CM | POA: Diagnosis not present

## 2019-11-21 DIAGNOSIS — Z79899 Other long term (current) drug therapy: Secondary | ICD-10-CM | POA: Insufficient documentation

## 2019-11-21 DIAGNOSIS — Z888 Allergy status to other drugs, medicaments and biological substances status: Secondary | ICD-10-CM | POA: Diagnosis not present

## 2019-11-21 DIAGNOSIS — Z803 Family history of malignant neoplasm of breast: Secondary | ICD-10-CM | POA: Diagnosis not present

## 2019-11-21 DIAGNOSIS — R102 Pelvic and perineal pain: Secondary | ICD-10-CM

## 2019-11-21 DIAGNOSIS — Z3A28 28 weeks gestation of pregnancy: Secondary | ICD-10-CM | POA: Diagnosis not present

## 2019-11-21 DIAGNOSIS — Z853 Personal history of malignant neoplasm of breast: Secondary | ICD-10-CM | POA: Insufficient documentation

## 2019-11-21 DIAGNOSIS — O4693 Antepartum hemorrhage, unspecified, third trimester: Secondary | ICD-10-CM | POA: Diagnosis not present

## 2019-11-21 DIAGNOSIS — Z87891 Personal history of nicotine dependence: Secondary | ICD-10-CM | POA: Insufficient documentation

## 2019-11-21 DIAGNOSIS — J45909 Unspecified asthma, uncomplicated: Secondary | ICD-10-CM | POA: Insufficient documentation

## 2019-11-21 DIAGNOSIS — O4690 Antepartum hemorrhage, unspecified, unspecified trimester: Secondary | ICD-10-CM

## 2019-11-21 DIAGNOSIS — O26899 Other specified pregnancy related conditions, unspecified trimester: Secondary | ICD-10-CM

## 2019-11-21 DIAGNOSIS — O99513 Diseases of the respiratory system complicating pregnancy, third trimester: Secondary | ICD-10-CM | POA: Diagnosis not present

## 2019-11-21 LAB — URINALYSIS, ROUTINE W REFLEX MICROSCOPIC
Bacteria, UA: NONE SEEN
Bilirubin Urine: NEGATIVE
Glucose, UA: NEGATIVE mg/dL
Hgb urine dipstick: NEGATIVE
Ketones, ur: NEGATIVE mg/dL
Nitrite: NEGATIVE
Protein, ur: NEGATIVE mg/dL
Specific Gravity, Urine: 1.017 (ref 1.005–1.030)
pH: 6 (ref 5.0–8.0)

## 2019-11-21 LAB — CBC
HCT: 31.4 % — ABNORMAL LOW (ref 36.0–46.0)
Hemoglobin: 10.5 g/dL — ABNORMAL LOW (ref 12.0–15.0)
MCH: 31.3 pg (ref 26.0–34.0)
MCHC: 33.4 g/dL (ref 30.0–36.0)
MCV: 93.7 fL (ref 80.0–100.0)
Platelets: 236 10*3/uL (ref 150–400)
RBC: 3.35 MIL/uL — ABNORMAL LOW (ref 3.87–5.11)
RDW: 12.9 % (ref 11.5–15.5)
WBC: 13.2 10*3/uL — ABNORMAL HIGH (ref 4.0–10.5)
nRBC: 0 % (ref 0.0–0.2)

## 2019-11-21 LAB — COMPREHENSIVE METABOLIC PANEL
ALT: 12 U/L (ref 0–44)
AST: 12 U/L — ABNORMAL LOW (ref 15–41)
Albumin: 2.9 g/dL — ABNORMAL LOW (ref 3.5–5.0)
Alkaline Phosphatase: 63 U/L (ref 38–126)
Anion gap: 9 (ref 5–15)
BUN: 7 mg/dL (ref 6–20)
CO2: 22 mmol/L (ref 22–32)
Calcium: 9 mg/dL (ref 8.9–10.3)
Chloride: 106 mmol/L (ref 98–111)
Creatinine, Ser: 0.41 mg/dL — ABNORMAL LOW (ref 0.44–1.00)
GFR calc Af Amer: >60 mL/min (ref >60)
GFR calc non Af Amer: >60 mL/min (ref >60)
Glucose, Bld: 93 mg/dL (ref 70–99)
Potassium: 4.1 mmol/L (ref 3.5–5.1)
Sodium: 137 mmol/L (ref 135–145)
Total Bilirubin: 0.7 mg/dL (ref 0.3–1.2)
Total Protein: 6.3 g/dL — ABNORMAL LOW (ref 6.5–8.1)

## 2019-11-21 LAB — WET PREP, GENITAL
Clue Cells Wet Prep HPF POC: NONE SEEN
Sperm: NONE SEEN
Trich, Wet Prep: NONE SEEN
Yeast Wet Prep HPF POC: NONE SEEN

## 2019-11-21 LAB — RAPID URINE DRUG SCREEN, HOSP PERFORMED
Amphetamines: NOT DETECTED
Barbiturates: NOT DETECTED
Benzodiazepines: NOT DETECTED
Cocaine: NOT DETECTED
Opiates: NOT DETECTED
Tetrahydrocannabinol: NOT DETECTED

## 2019-11-21 MED ORDER — ACETAMINOPHEN 500 MG PO TABS
1000.0000 mg | ORAL_TABLET | Freq: Once | ORAL | Status: AC
  Administered 2019-11-21: 1000 mg via ORAL
  Filled 2019-11-21: qty 2

## 2019-11-21 MED ORDER — CYCLOBENZAPRINE HCL 5 MG PO TABS
10.0000 mg | ORAL_TABLET | Freq: Once | ORAL | Status: AC
  Administered 2019-11-21: 10 mg via ORAL
  Filled 2019-11-21: qty 2

## 2019-11-21 MED ORDER — LACTATED RINGERS IV BOLUS
1000.0000 mL | Freq: Once | INTRAVENOUS | Status: AC
  Administered 2019-11-21: 1000 mL via INTRAVENOUS

## 2019-11-21 NOTE — Progress Notes (Signed)
CNM called out from pt room for assistance, patient appeared very drowsy and pale. Responded some to voice and touch. Stated she felt lightheaded and nauseous. Obtained vitals and the tech performed an EKG per provider. Labs sent and IV started. Pt stated she wanted to sit up that it felt better for the nausea and lightheadedness. Began to carry out conversation and opened her eyes. Stated she felt a little bit better, no longer nauseated just very tired. Will continue to monitor.

## 2019-11-21 NOTE — Progress Notes (Signed)
Pt signed paper copy of discharge paper work. Put with medical records.

## 2019-11-21 NOTE — MAU Note (Signed)
Michaela Williams is a 24 y.o. at [redacted]w[redacted]d here in MAU reporting: woke up in pain this morning and started spotting. +FM  Onset of complaint: today  Pain score: 7/10  Vitals:   11/21/19 1354  BP: (!) 97/57  Pulse: (!) 105  Resp: 16  Temp: 99.2 F (37.3 C)  SpO2: 98%     FHT: +FM  Lab orders placed from triage: UA

## 2019-11-21 NOTE — MAU Provider Note (Signed)
History     CSN: [id number]  Arrival date and time: 11/21/19 1329   First Provider Initiated Contact with Patient 11/21/19 1803      Chief Complaint  Patient presents with  . Vaginal Bleeding  . Pelvic Pain   HPI Michaela Williams is a 24 y.o. G2P1001 at 3w5dwho presents to MAU with chief complaints of pelvic pain and vaginal spotting.  Vaginal Spotting This is a recurrent problem, current episode began earlier today. Patient denies heavy bleeding, dysuria. Remote from intercourse.  Pelvic Pain This is a recurrent problem for which patient has previously been prescribed a maternity belt. She endorses "waking up with pain" this morning. Pain score is 7/10, slight relief with rest. Aggravated by walking and repositioning. She has not taken medication or tried other treatments for this complaint.  Impact with Son Patient states her toddler son likes to hop over her abdomen. Prior to her arrival in MAU he tried to hop over her and "tripped on my belly". States the edges of his feet made contact with her abdomen. This is a recurrent problem that patient has mentioned on two previous MAU visits.   She denies heavy vaginal bleeding, leaking of fluid, decreased fetal movement, fever, falls, or recent illness.   [facility]  OB History    Gravida  2   Para  1   Term  1   Preterm  0   AB  0   Living  1     SAB  0   TAB  0   Ectopic  0   Multiple  0   Live Births  1           Past Medical History:  Diagnosis Date  . Anemia   . Asthma   . breast ca dx'd 05/2017   left  . Depression    no meds in 3 yrs  . Headache   . Infection    UTI  . Seizures [place]   "seizure like activity", reaction to medicaiton, none since    Past Surgical History:  Procedure Laterality Date  . ESOPHAGOGASTRODUODENOSCOPY (EGD) WITH PROPOFOL N/A 06/03/2017   Procedure: ESOPHAGOGASTRODUODENOSCOPY (EGD) WITH PROPOFOL;  Surgeon: SLadene Artist MD;   Location: [facility];  Service: Endoscopy;  Laterality: N/A;    Family History  Adopted: Yes  Problem Relation Age of Onset  . Mental illness Mother   . Cancer Mother   . Breast cancer Mother   . Cancer Maternal Grandmother   . Hypertension Maternal Grandmother   . Diabetes Maternal Grandmother   . Cancer Maternal Aunt   . Breast cancer Maternal Aunt   . Breast cancer Sister   . Breast cancer Maternal Aunt   . Breast cancer Maternal Aunt   . Breast cancer Maternal Aunt   . Breast cancer Maternal Aunt     Social History   Tobacco Use  . Smoking status: Former Smoker    Types: Cigarettes    Quit date: 12/19/2014    Years since quitting: 4.9  . Smokeless tobacco: Never Used  . Tobacco comment: age 24 Substance Use Topics  . Alcohol use: Not Currently    Comment: occasionally  . Drug use: No    Allergies:  Allergies  Allergen Reactions  . Lexapro [Escitalopram] Other (See Comments)    Didn't like how it made her feel  . Lithium Other (See Comments)    SEIZURES, Childhood reaction  Medications Prior to Admission  Medication Sig Dispense Refill Last Dose  . Prenatal Vit-Fe Fumarate-FA (PRENATAL PO) Take by mouth.   11/20/2019 at Unknown time  . Blood Pressure Monitoring (BLOOD PRESSURE KIT) DEVI 1 kit by Does not apply route once a week. Check Blood Pressure regularly and record readings into the [employer] App.  Large Cuff.  DX O90.0 1 each 0   . Elastic Bandages & Supports (COMFORT FIT MATERNITY SUPP SM) MISC Wear as directed. 1 each 0   . omeprazole (PRILOSEC) 40 MG capsule Take 1 capsule (40 mg total) by mouth at bedtime. (Patient not taking: Reported on 09/01/2019) 30 capsule 2   . phenazopyridine (PYRIDIUM) 200 MG tablet Take 1 tablet (200 mg total) by mouth 3 (three) times daily as needed for pain. (Patient not taking: Reported on 09/29/2019) 12 tablet 0   . sulfamethoxazole-trimethoprim (BACTRIM DS) 800-160 MG tablet Take 1 tablet by mouth 2 (two) times daily.  (Patient not taking: Reported on 11/16/2019) 14 tablet 0     Review of Systems  Constitutional: Negative for fatigue and fever.  Respiratory: Negative for shortness of breath.   Gastrointestinal: Negative for abdominal pain.  Genitourinary: Positive for pelvic pain and vaginal bleeding.  Musculoskeletal: Negative for back pain.  Neurological: Negative for dizziness, syncope and weakness.  All other systems reviewed and are negative.  Physical Exam   Blood pressure 103/63, pulse (!) 105, temperature 98.9 F (37.2 C), temperature source Oral, resp. rate 20, height _0  (1.549 m), weight 69.8 kg, last menstrual period 05/04/2019, SpO2 99 %.  Physical Exam  Nursing note and vitals reviewed. Constitutional: She is oriented to person, place, and time. She appears well-developed and well-nourished.  Cardiovascular: Normal rate, normal heart sounds and intact distal pulses.  Respiratory: Effort normal and breath sounds normal.  GI: She exhibits no distension. There is no abdominal tenderness. There is no rebound and no guarding.  Gravid  Genitourinary:    Genitourinary Comments: Pelvic exam: External genitalia normal, vaginal walls pink and well rugated, cervix visually closed, no lesions noted.     Neurological: She is alert and oriented to person, place, and time.  Skin: Skin is warm and dry.  Psychiatric: She has a normal mood and affect. Her behavior is normal. Judgment and thought content normal.   MAU Course/MDM  Procedures: sterile speculum exam, [place] MFM OB Limited  --Reactive tracing: baseline 145, moderate variability, positive 10 x 10 accels, no decels --[place]: quiet --S/p 3.5 hours monitoring. No concerning findings, no bleeding   1705 --I entered the room to discuss patient discharge from MAU. Upon entering patient's boyfriend was standing just inside the door, patient kneeling with boyfriend holding her up by her elbows. Additional support requested. 3 person assist to bed.  IV access initiated. Fluid bolus initiated. CMP and UDS collected. Stat EKG ordered. Non-specific T wave abnormality. --Per patient and boyfriend she fell into a deep sleep s/p Flexeril, woke up suddenly and felt disoriented and stated she needed to void. Patient states she did not have a seizure or lose consciousness but was slow to respond to questions posed by care team and me and unable to assist with returning to bed. --Dr. Ilda Basset notified of event, interventions and patient response at 1712. --Returned to bedside at [place] [facility]. Patient close to baseline level of awareness. Agreeable to waiting for CMET results before discharge  Patient Vitals for the past 24 hrs:  BP Temp Temp src Pulse Resp SpO2 Height Weight  11/21/19 1813  104/64 -- -- (!) 103 17 -- -- --  11/21/19 1743 103/63 -- -- (!) 105 -- -- -- --  11/21/19 1730 -- -- -- -- -- 99 % -- --  11/21/19 1725 -- -- -- -- -- 98 % -- --  11/21/19 1722 108/72 -- -- (!) 106 -- -- -- --  11/21/19 1720 -- -- -- -- -- 99 % -- --  11/21/19 1715 97/64 -- -- (!) 105 20 99 % -- --  11/21/19 1712 (!) 79/47 -- -- (!) 103 -- -- -- --  11/21/19 1710 -- -- -- -- -- 100 % -- --  11/21/19 1705 -- -- -- -- -- 100 % -- --  11/21/19 1701 99/66 -- -- (!) 118 -- -- -- --  11/21/19 1655 -- -- -- -- -- 98 % -- --  11/21/19 1650 -- -- -- -- -- 98 % -- --  11/21/19 1645 102/63 98.9 F (37.2 C) Oral (!) 118 -- 100 % -- --  11/21/19 1640 -- -- -- -- -- 98 % -- --  11/21/19 1635 -- -- -- -- -- 99 % -- --  11/21/19 1630 -- -- -- -- -- 99 % -- --  11/21/19 1407 104/66 -- -- (!) 106 -- -- -- --  11/21/19 1354 (!) 97/57 99.2 F (37.3 C) Oral (!) 105 16 98 % -- --  11/21/19 1350 -- -- -- -- -- -- _0  (1.549 m) 69.8 kg   Orders Placed This Encounter  Procedures  . Wet prep, genital  . Culture, OB Urine  . [place] MFM OB Limited  . Urinalysis, Routine w reflex microscopic  . CBC  . Comprehensive metabolic panel  . Urine rapid drug screen (hosp performed)  . EKG  12-Lead  . Insert peripheral IV  . Discharge patient   Results for orders placed or performed during the hospital encounter of 11/21/19 (from the past 24 hour(s))  Urinalysis, Routine w reflex microscopic     Status: Abnormal   Collection Time: 11/21/19  2:05 PM  Result Value Ref Range   Color, Urine YELLOW YELLOW   APPearance HAZY (A) CLEAR   Specific Gravity, Urine 1.017 1.005 - 1.030   pH 6.0 5.0 - 8.0   Glucose, UA NEGATIVE NEGATIVE mg/dL   Hgb urine dipstick NEGATIVE NEGATIVE   Bilirubin Urine NEGATIVE NEGATIVE   Ketones, ur NEGATIVE NEGATIVE mg/dL   Protein, ur NEGATIVE NEGATIVE mg/dL   Nitrite NEGATIVE NEGATIVE   Leukocytes,Ua MODERATE (A) NEGATIVE   RBC / HPF 0-5 0 - 5 RBC/hpf   WBC, UA 11-20 0 - 5 WBC/hpf   Bacteria, UA NONE SEEN NONE SEEN   Squamous Epithelial / LPF 6-10 0 - 5   Mucus PRESENT   Urine rapid drug screen (hosp performed)     Status: None   Collection Time: 11/21/19  2:05 PM  Result Value Ref Range   Opiates NONE DETECTED NONE DETECTED   Cocaine NONE DETECTED NONE DETECTED   Benzodiazepines NONE DETECTED NONE DETECTED   Amphetamines NONE DETECTED NONE DETECTED   Tetrahydrocannabinol NONE DETECTED NONE DETECTED   Barbiturates NONE DETECTED NONE DETECTED  Wet prep, genital     Status: Abnormal   Collection Time: 11/21/19  2:22 PM  Result Value Ref Range   Yeast Wet Prep HPF POC NONE SEEN NONE SEEN   Trich, Wet Prep NONE SEEN NONE SEEN   Clue Cells Wet Prep HPF POC NONE SEEN NONE SEEN   WBC, Wet Prep HPF  POC MODERATE (A) NONE SEEN   Sperm NONE SEEN   CBC     Status: Abnormal   Collection Time: 11/21/19  2:59 PM  Result Value Ref Range   WBC 13.2 (H) 4.0 - 10.5 K/uL   RBC 3.35 (L) 3.87 - 5.11 MIL/uL   Hemoglobin 10.5 (L) 12.0 - 15.0 g/dL   HCT 31.4 (L) 36.0 - 46.0 %   MCV 93.7 80.0 - 100.0 fL   MCH 31.3 26.0 - 34.0 pg   MCHC 33.4 30.0 - 36.0 g/dL   RDW 12.9 11.5 - 15.5 %   Platelets 236 150 - 400 K/uL   nRBC 0.0 0.0 - 0.2 %  Comprehensive  metabolic panel     Status: Abnormal   Collection Time: 11/21/19  2:59 PM  Result Value Ref Range   Sodium 137 135 - 145 mmol/L   Potassium 4.1 3.5 - 5.1 mmol/L   Chloride 106 98 - 111 mmol/L   CO2 22 22 - 32 mmol/L   Glucose, Bld 93 70 - 99 mg/dL   BUN 7 6 - 20 mg/dL   Creatinine, Ser 0.41 (L) 0.44 - 1.00 mg/dL   Calcium 9.0 8.9 - 10.3 mg/dL   Total Protein 6.3 (L) 6.5 - 8.1 g/dL   Albumin 2.9 (L) 3.5 - 5.0 g/dL   AST 12 (L) 15 - 41 U/L   ALT 12 0 - 44 U/L   Alkaline Phosphatase 63 38 - 126 U/L   Total Bilirubin 0.7 0.3 - 1.2 mg/dL   GFR calc non Af Amer >60 >60 mL/min   GFR calc Af Amer >60 >60 mL/min   Anion gap 9 5 - 15   [place] MFM OB Limited  Result Date: 11/21/2019 ----------------------------------------------------------------------  OBSTETRICS REPORT                       (Signed Final 11/21/2019 04:23 pm) ---------------------------------------------------------------------- Patient Info  ID #:       [id number]                          D.O.B.:  [birth year] (24 yrs)  Name:       Michaela Williams                  Visit Date: 11/21/2019 03:28 pm ---------------------------------------------------------------------- Performed By  Attending:        Johnell Comings MD         Secondary Phy.:   [facility] MAU/Triage  Performed By:     Berlinda Last          [facility]                    [place]  Referred By:      Darlina Rumpf ---------------------------------------------------------------------- Orders  #  Description                           Code        Ordered By  1  [place] MFM OB  LIMITED                     94801.65    Mallie Snooks ----------------------------------------------------------------------  #  Order #                     Accession #                Episode #  1  [id number]                   [id number]                 [id number]  ---------------------------------------------------------------------- Indications  Vaginal bleeding in pregnancy, third trimester O46.93  [redacted] weeks gestation of pregnancy                Z3A.28  Pelvic pain affecting pregnancy in third       O26.893  trimester ---------------------------------------------------------------------- Fetal Evaluation  Num Of Fetuses:         1  Fetal Heart Rate(bpm):  129  Cardiac Activity:       Observed  Presentation:           Pilar Plate breech  Placenta:               Posterior  P. Cord Insertion:      Visualized  Amniotic Fluid  AFI FV:      Within normal limits  AFI Sum(cm)     %Tile       Largest Pocket(cm)  14.6            50          5  RUQ(cm)       RLQ(cm)       LUQ(cm)        LLQ(cm)  5             2.8           3.7            3.1  Comment:    No placental abruption or previa identified. ---------------------------------------------------------------------- OB History  Gravidity:    2         Term:   1  Living:       1 ---------------------------------------------------------------------- Gestational Age  LMP:           28w 5d        Date:  05/04/19                 EDD:   02/08/20  Best:          28w 5d     Det. By:  LMP  (05/04/19)          EDD:   02/08/20 ---------------------------------------------------------------------- Anatomy  Stomach:               Appears normal, left   Bladder:                Appears normal                         sided ---------------------------------------------------------------------- Cervix Uterus Adnexa  Cervix  Not visualized (advanced GA >24wks)  Adnexa  No abnormality visualized. ---------------------------------------------------------------------- Comments  A limited ultrasound performed today shows that the fetus is  in the breech presentation.  There was normal amniotic fluid noted. ----------------------------------------------------------------------                   Johnell Comings, MD Electronically Signed Final Report   11/21/2019 04:23  pm ----------------------------------------------------------------------  Assessment and Plan  --25 y.o. G2P1001 at [redacted]w[redacted]d --Reactive tracing, closed cervix --S/p 4.5 hours continuous monitoring in MAU --Vaginal spotting, blood type A POS. No bleeding in MAU --No concerning findings on MFM OB Limited --Flexeril added as sensitivity on allergy list --Patient at baseline engagement, sensation, proprioception prior to discharge --Denies pain prior to discharge --Urine culture in work --Discharge home in stable condition in wheelchair  F/U: --[facility]: next appointment 11/30/2019  SDarlina Rumpf CO[place]ee5/17/2021, 7:24 PM

## 2019-11-21 NOTE — Discharge Instructions (Signed)

## 2019-11-22 LAB — GC/CHLAMYDIA PROBE AMP (~~LOC~~) NOT AT ARMC
Chlamydia: NEGATIVE
Comment: NEGATIVE
Comment: NORMAL
Neisseria Gonorrhea: NEGATIVE

## 2019-11-23 LAB — CULTURE, OB URINE: Culture: 70000 — AB

## 2019-11-30 ENCOUNTER — Telehealth (INDEPENDENT_AMBULATORY_CARE_PROVIDER_SITE_OTHER): Payer: 59 | Admitting: Advanced Practice Midwife

## 2019-11-30 ENCOUNTER — Encounter: Payer: Self-pay | Admitting: Advanced Practice Midwife

## 2019-11-30 DIAGNOSIS — O99891 Other specified diseases and conditions complicating pregnancy: Secondary | ICD-10-CM

## 2019-11-30 DIAGNOSIS — Z3480 Encounter for supervision of other normal pregnancy, unspecified trimester: Secondary | ICD-10-CM

## 2019-11-30 DIAGNOSIS — R519 Headache, unspecified: Secondary | ICD-10-CM

## 2019-11-30 DIAGNOSIS — O99343 Other mental disorders complicating pregnancy, third trimester: Secondary | ICD-10-CM

## 2019-11-30 DIAGNOSIS — M549 Dorsalgia, unspecified: Secondary | ICD-10-CM

## 2019-11-30 DIAGNOSIS — O26893 Other specified pregnancy related conditions, third trimester: Secondary | ICD-10-CM

## 2019-11-30 DIAGNOSIS — Z3A30 30 weeks gestation of pregnancy: Secondary | ICD-10-CM

## 2019-11-30 NOTE — Progress Notes (Signed)
Virtual ROB    CC: tension in calf muscles   Pt notes getting migraine HA's and blurred vision.
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
## 2019-12-06 ENCOUNTER — Encounter (HOSPITAL_COMMUNITY): Payer: Self-pay | Admitting: Family Medicine

## 2019-12-06 ENCOUNTER — Inpatient Hospital Stay (HOSPITAL_COMMUNITY)
Admission: AD | Admit: 2019-12-06 | Discharge: 2019-12-06 | Disposition: A | Discharge status: Home / Self Care | Payer: 59 | Attending: Family Medicine | Admitting: Family Medicine

## 2019-12-06 ENCOUNTER — Other Ambulatory Visit: Payer: Self-pay

## 2019-12-06 DIAGNOSIS — Z888 Allergy status to other drugs, medicaments and biological substances status: Secondary | ICD-10-CM | POA: Insufficient documentation

## 2019-12-06 DIAGNOSIS — Y999 Unspecified external cause status: Secondary | ICD-10-CM | POA: Insufficient documentation

## 2019-12-06 DIAGNOSIS — Y929 Unspecified place or not applicable: Secondary | ICD-10-CM | POA: Insufficient documentation

## 2019-12-06 DIAGNOSIS — Z3A30 30 weeks gestation of pregnancy: Secondary | ICD-10-CM | POA: Diagnosis not present

## 2019-12-06 DIAGNOSIS — Z3689 Encounter for other specified antenatal screening: Secondary | ICD-10-CM

## 2019-12-06 DIAGNOSIS — O99891 Other specified diseases and conditions complicating pregnancy: Secondary | ICD-10-CM

## 2019-12-06 DIAGNOSIS — W51XXXA Accidental striking against or bumped into by another person, initial encounter: Secondary | ICD-10-CM | POA: Insufficient documentation

## 2019-12-06 DIAGNOSIS — R112 Nausea with vomiting, unspecified: Secondary | ICD-10-CM | POA: Diagnosis not present

## 2019-12-06 DIAGNOSIS — S3991XA Unspecified injury of abdomen, initial encounter: Secondary | ICD-10-CM

## 2019-12-06 DIAGNOSIS — W500XXA Accidental hit or strike by another person, initial encounter: Secondary | ICD-10-CM

## 2019-12-06 DIAGNOSIS — R109 Unspecified abdominal pain: Secondary | ICD-10-CM

## 2019-12-06 DIAGNOSIS — R42 Dizziness and giddiness: Secondary | ICD-10-CM | POA: Diagnosis not present

## 2019-12-06 DIAGNOSIS — S3092XA Unspecified superficial injury of abdominal wall, initial encounter: Secondary | ICD-10-CM | POA: Insufficient documentation

## 2019-12-06 DIAGNOSIS — Y939 Activity, unspecified: Secondary | ICD-10-CM | POA: Insufficient documentation

## 2019-12-06 DIAGNOSIS — O9A213 Injury, poisoning and certain other consequences of external causes complicating pregnancy, third trimester: Secondary | ICD-10-CM | POA: Insufficient documentation

## 2019-12-06 MED ORDER — ONDANSETRON 4 MG PO TBDP
4.0000 mg | ORAL_TABLET | Freq: Once | ORAL | Status: AC
  Administered 2019-12-06: 4 mg via ORAL
  Filled 2019-12-06: qty 1
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
## 2019-12-06 NOTE — Discharge Instructions (Signed)
Abdominal Pain During Pregnancy  Abdominal pain is common during pregnancy, and has many possible causes. Some causes are more serious than others, and sometimes the cause is not known. Abdominal pain can be a sign that labor is starting. It can also be caused by normal growth and stretching of muscles and ligaments during pregnancy. Always tell your health care provider if you have any abdominal pain. Follow these instructions at home:  Do not have sex or put anything in your vagina until your pain goes away completely.  Get plenty of rest until your pain improves.  Drink enough fluid to keep your urine pale yellow.  Take over-the-counter and prescription medicines only as told by your health care provider.  Keep all follow-up visits as told by your health care provider. This is important. Contact a health care provider if:  Your pain continues or gets worse after resting.  You have lower abdominal pain that: ? Comes and goes at regular intervals. ? Spreads to your back. ? Is similar to menstrual cramps.  You have pain or burning when you urinate. Get help right away if:  You have a fever or chills.  You have vaginal bleeding.  You are leaking fluid from your vagina.  You are passing tissue from your vagina.  You have vomiting or diarrhea that lasts for more than 24 hours.  Your baby is moving less than usual.  You feel very weak or faint.  You have shortness of breath.  You develop severe pain in your upper abdomen. Summary  Abdominal pain is common during pregnancy, and has many possible causes.  If you experience abdominal pain during pregnancy, tell your health care provider right away.  Follow your health care provider's home care instructions and keep all follow-up visits as directed. This information is not intended to replace advice given to you by your health care provider. Make sure you discuss any questions you have with your health care  provider. Document Revised: 10/11/2018 Document Reviewed: 09/25/2016 Elsevier Patient Education  2020 Elsevier Inc.  

## 2019-12-23 ENCOUNTER — Telehealth: Payer: Self-pay | Admitting: Advanced Practice Midwife

## 2019-12-23 ENCOUNTER — Encounter: Payer: 59 | Admitting: Advanced Practice Midwife

## 2019-12-23 NOTE — Telephone Encounter (Signed)
Patient was scheduled for in person visit at 0850 today but did not present to clinic. Called on phone number of record, no answer, no voicemail. Will f/u with MyChart message.  Mallie Snooks, MSN, CNM Certified Nurse Midwife, [facility] for [employer], [place] [facility] 12/23/19 9:33 AM

## 2019-12-28 ENCOUNTER — Encounter: Payer: Self-pay | Admitting: Obstetrics

## 2019-12-28 ENCOUNTER — Ambulatory Visit (INDEPENDENT_AMBULATORY_CARE_PROVIDER_SITE_OTHER): Payer: 59 | Admitting: Women's Health

## 2019-12-28 ENCOUNTER — Other Ambulatory Visit: Payer: Self-pay | Admitting: Obstetrics and Gynecology

## 2019-12-28 ENCOUNTER — Encounter: Payer: Self-pay | Admitting: Women's Health

## 2019-12-28 ENCOUNTER — Ambulatory Visit (INDEPENDENT_AMBULATORY_CARE_PROVIDER_SITE_OTHER): Payer: 59 | Admitting: Clinical

## 2019-12-28 ENCOUNTER — Other Ambulatory Visit: Payer: Self-pay

## 2019-12-28 VITALS — BP 110/76 | HR 93 | Wt 159.6 lb

## 2019-12-28 DIAGNOSIS — Z3480 Encounter for supervision of other normal pregnancy, unspecified trimester: Secondary | ICD-10-CM

## 2019-12-28 DIAGNOSIS — F319 Bipolar disorder, unspecified: Secondary | ICD-10-CM

## 2019-12-28 DIAGNOSIS — O26893 Other specified pregnancy related conditions, third trimester: Secondary | ICD-10-CM

## 2019-12-28 DIAGNOSIS — O99343 Other mental disorders complicating pregnancy, third trimester: Secondary | ICD-10-CM

## 2019-12-28 DIAGNOSIS — F3181 Bipolar II disorder: Secondary | ICD-10-CM

## 2019-12-28 DIAGNOSIS — F314 Bipolar disorder, current episode depressed, severe, without psychotic features: Secondary | ICD-10-CM

## 2019-12-28 DIAGNOSIS — O26843 Uterine size-date discrepancy, third trimester: Secondary | ICD-10-CM | POA: Insufficient documentation

## 2019-12-28 DIAGNOSIS — Z3A34 34 weeks gestation of pregnancy: Secondary | ICD-10-CM

## 2019-12-28 DIAGNOSIS — D649 Anemia, unspecified: Secondary | ICD-10-CM

## 2019-12-28 DIAGNOSIS — O99013 Anemia complicating pregnancy, third trimester: Secondary | ICD-10-CM | POA: Insufficient documentation

## 2019-12-28 DIAGNOSIS — R519 Headache, unspecified: Secondary | ICD-10-CM | POA: Insufficient documentation

## 2019-12-28 MED ORDER — TRAZODONE HCL 50 MG PO TABS
25.0000 mg | ORAL_TABLET | Freq: Every evening | ORAL | 3 refills | Status: DC | PRN
Start: 2019-12-28 — End: 2020-02-11

## 2019-12-28 MED ORDER — ARIPIPRAZOLE 2 MG PO TABS: 2.0000 mg | ORAL_TABLET | Freq: Every day | ORAL | 3 refills | Status: DC

## 2019-12-28 MED ORDER — FERROUS SULFATE 325 (65 FE) MG PO TABS: 325.0000 mg | ORAL_TABLET | Freq: Every day | ORAL | 3 refills | Status: DC

## 2019-12-28 NOTE — BH Specialist Note (Signed)
Integrated Behavioral Health via Telemedicine Video Publishing rights manager) Visit  Patient and/or legal guardian verbally consented to [facility] services about presenting concerns and psychiatric consultation as appropriate.   12/28/2019 [name] [id number]  Number of [place] visits: 1 Session Start time: 9:01  Session End time: 9:21 Total time: 20  Referring Provider: Vernice Jefferson, NP Type of Visit: Video Patient/Family location: [facility] Provider location: [facility] [place] [facility]  All persons participating in visit: Patient Michaela Williams and [place]    Confirmed patient's address: Yes  Confirmed patient's phone number: Yes  Any changes to demographics: No, but may need to be reached via her father's number [phone]) or mother's number [phone]).  Confirmed patient's insurance: Yes  Any changes to patient's insurance: No   Discussed confidentiality: Yes   I connected with [name]  by a video enabled telemedicine application ([employer]) and verified that I am speaking with the correct person using two identifiers.     I discussed the limitations of evaluation and management by telemedicine and the availability of in person appointments.  I discussed that the purpose of this visit is to provide behavioral health care while limiting exposure to the novel coronavirus.   Discussed there is a possibility of technology failure and discussed alternative modes of communication if that failure occurs.  I discussed that engaging in this virtual visit, they consent to the provision of behavioral healthcare and the services will be billed under their insurance.  Patient and/or legal guardian expressed understanding and consented to virtual visit: Yes   PRESENTING CONCERNS: Patient and/or family reports the following symptoms/concerns: Pt states her primary concern today is being  unmedicated for bipolar disorder for 3 years, with symptoms of both depression and anxiety increasing during current pregnancy. Pt has taken Abilify since 24yo. Pt is sleeping in 45 minute increments, has not eaten since last night, migraine current, and has passive SI with no intent or plan since 24yo.  Duration of problem: Ongoing, with increase in current pregnancy; Severity of problem: severe  STRENGTHS (Protective Factors/Coping Skills): Open to treatment  GOALS ADDRESSED: Patient will: 1.  Reduce symptoms of: anxiety, depression, insomnia and mood instability  2.  Increase knowledge and/or ability of: stress reduction  3.  Demonstrate ability to: Increase healthy adjustment to current life circumstances and Increase motivation to adhere to plan of care  INTERVENTIONS: Interventions utilized:  Solution-Focused Strategies and Psychoeducation and/or Health Education Standardized Assessments completed: C-SSRS Short, GAD-7 and PHQ 9  ASSESSMENT: Patient currently experiencing Bipolar 1 disorder (as previously diagnosed via psychiatry).   Patient may benefit from psychoeducation and brief therapeutic interventions regarding coping with symptoms of escalating depression and anxiety, related to bipolar disorder .  PLAN: 1. Follow up with behavioral health clinician on : One week for virtual visit; one day for phone follow up. Call [facility] at [phone] if needed prior to scheduled visit. 2. Behavioral recommendations:  -Follow safety plan, as discussed -Begin taking BH medication, as prescribed, starting tonight -Continue taking prenatal vitamins again, as recommended by medical provider -Focus on self-care today: when home from son's appointment, eat a full meal, stay hydrated; prioritize healthy sleep tonight  3. Referral(s): [place] (In Clinic) and Community Resources:  BH resources/Crisis numbers  I discussed the assessment and treatment plan with the  patient and/or parent/guardian. They were provided an opportunity to ask questions and all were answered. They agreed with the plan  and demonstrated an understanding of the instructions.   They were advised to call back or seek an in-person evaluation if the symptoms worsen or if the condition fails to improve as anticipated.  [name] [facility]  Depression screen [facility] 2/9 12/28/2019 05/03/2019 04/05/2019 12/22/2018 12/07/2018  Decreased Interest 3 0 2 3 3   Down, Depressed, Hopeless 2 - 3 3 3   PHQ - 2 Score 5 0 5 6 6   Altered sleeping 3 - 3 3 3   Tired, decreased energy 3 - 3 3 3   Change in appetite 3 - 1 0 3  Feeling bad or failure about yourself  3 - 3 2 3   Trouble concentrating 3 - 3 3 3   Moving slowly or fidgety/restless 3 - 2 3 3   Suicidal thoughts 2 - 2 2 0  PHQ-9 Score 25 - 22 22 24   Difficult doing work/chores Very difficult - Extremely dIfficult Extremely dIfficult Very difficult   GAD 7 : Generalized Anxiety Score 12/28/2019 05/14/2017  Nervous, Anxious, on Edge 3 3  Control/stop worrying 3 3  Worry too much - different things 3 3  Trouble relaxing 3 3  Restless 3 1  Easily annoyed or irritable 3 2  Afraid - awful might happen 3 2  Total GAD 7 Score 21 17  Anxiety Difficulty - Somewhat difficult

## 2019-12-28 NOTE — Patient Instructions (Signed)
Behavioral Health Resources:   What if I or someone I know is in crisis?  . If you are thinking about harming yourself or having thoughts of suicide, or if you know someone who is, seek help right away.  . Call your doctor or mental health care provider.  . Call 911 or go to a hospital emergency room to get immediate help, or ask a friend or family member to help you do these things.  . Call the [place] National Suicide Prevention Lifeline's toll-free, 24-hour hotline at 1-800-273-TALK (249)888-2074) or TTY: 1-800-799-4 TTY 517-197-8752) to talk to a trained counselor.  . If you are in crisis, make sure you are not left alone.   . If someone else is in crisis, make sure he or she is not left alone   24 Hour :   [place] National Suicide Hotline: 424-209-9984  Therapeutic Alternative Mobile Crisis: 718-727-9014   [facility]  [address], [place], [place] [address]  ([phone] or [phone]  [facility] [employer] (Domestic Violence, Rape & Victim Assistance)  [phone]  [place]  [address], [place]  [address]   [phone] or [phone]   [place]: [phone] (8am-4pm) or [phone] (after hours)           [facility], [address] [place], [place] Fax: [phone] www.[email or web address]  *Interpreters available *Accepts Medicaid, Medicare, uninsured  [facility]   Mon-Fri: 8am-5pm [address] [place], [place] [phone](phone); [phone]) BloggerCourse.com  *Accepts Medicare  [facility] [place], Fri: 8am-4pm [address] [place], [place] (phone); [phone] (fax) TaskTown.es  *[place] Mon-Fri: 9am-5pm  [address], [place], [place] (phone); [phone]  [email or web address]  *Accepts Medicaid  [facility] [address] [place], [place]  SalonLookup.es   [facility] [place], 8:30am-12pm/1pm-2:30pm [address], [place], [place] (phone); [phone] (fax) www.fspcares.org  *Accepts Medicaid, sliding-scale*Bilingual services available  [facility] Mon-Fri, 8am-7pm [place], [place]  [phone](phone); ([phone]) www.[email or web address]  *Accepts Medicaid *Bilingual services available  [facility] Mon-Fri: 8am-5pm, Saturday by appointment only [place], [place], [place] (phone); [phone] (fax) [email or web address]   [facility] [address], [place], [place], [place] [email or web address]  *Free & reduced services for uninsured and underinsured individuals *Bilingual services for Spanish-speaking clients 21 and under  [facility], [address], [place], [place]); [phone]) RunningConvention.de  *Bring your own interpreter at first visit *Accepts Medicare and [facility]  [place] Mon-Fri: 9am-5:30pm [address], [place], [place], [place] (phone), [phone] (fax) After hours crisis line: ([phone] www.[email or web address]  *Accepts Medicare and Medicaid  [facility], 8am-6pm [address], [place], [place] (phone); [phone] (fax) http://[email or web address]  *Subsidized costs available  [facility] Mon-Fri: 8am-4pm [address], [place],  [place] [phone](phone); [phone]) www.p[email or web address]  *Accepts Medicaid  [facility] Same day access hours: Mon-Fri, 8:30-3pm Crisis hours: Mon-Fri, 8am-5pm [place], [place] Same day access hours: Mon-Fri, 8:30-3pm Crisis hours: Mon-Fri, 8am-8pm [address], [place], [place] (phone); [phone] (fax) www.[email or web address]  *Accepts Medicaid and Medicare  [facility], [place], Fri: 9am-9pm Tues, Thurs: 9am-6pm [address],  [place], [place] (phone); [phone] (fax) https://[email or web address]  *(Accepts Medicare and Medicaid; payment plans available)*Bilingual services available  [facility] [address], [place], [place] (phone); [phone] (fax) [email or web address] [address] [place], [place], [place]  [email or web address]  *Bilingual services available  [facility]) Mon-Thurs: 8am-8pm [address] [place], [place], [place] (phone); [phone] (fax) [email or web address]  *Accepts Medicaid*Bilingual services available  [place] [address] [place], [place], [place] (phone) DeadConnect.[email or web address]  *Accepts Medicaid *Bilingual services available  [facility] Mon-Fri, 9am-4:45pm [address], [place], [place] (phone); [phone] (fax) http://[email or web address]-[email or web address]  *Accepts Medicare  [facility] Mon-Thurs: 8:30-8pm, Fri: 8:30am-7pm [address] [place], [place] (3rd floor) [phone] (phone); [phone] (fax) VIPinterview.[email or web address]  *Accepts Medicaid; income-based reduced rates available  [facility] Mon-Fri: 8am-5pm [address], [place], [place],  [place] (phone); [phone] (fax) [email or web address]://[email or web address]  *Accepts Medicaid*Bilingual services available  [place], [place], [place], [place] (phone); [phone] (fax) www.[email or web address]  *Free emergency housing and clinical services for youth in crisis  [facility] ([place])  [address] [place] [phone] www.[email or web address]  *Provides direct services to individuals in recovery from mental illness, including support groups, recovery skills classes, and one on one peer support  [employer] on [address]) [place] helpline: (765)293-7126  https://namiguilford.org  *A community hub for information relating to local resources and services for the friends and families of individuals living alongside a mental health condition, as well as the individuals themselves. Classes and support groups also provided
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
## 2019-12-28 NOTE — Patient Instructions (Addendum)
Maternity Assessment Unit (MAU)  The Maternity Assessment Unit (MAU) is located at the [facility] [place] [facility]. The address is: [address] [place], [place], [place] [address]. Please see map below for additional directions.    The Maternity Assessment Unit is designed to help you during your pregnancy, and for up to 6 weeks after delivery, with any pregnancy- or postpartum-related emergencies, if you think you are in labor, or if your water has broken. For example, if you experience nausea and vomiting, vaginal bleeding, severe abdominal or pelvic pain, elevated blood pressure or other problems related to your pregnancy or postpartum time, please come to the Maternity Assessment Unit for assistance.        Preterm Labor and Birth Information  The normal length of a pregnancy is 39-41 weeks. Preterm labor is when labor starts before 37 completed weeks of pregnancy. What are the risk factors for preterm labor? Preterm labor is more likely to occur in women who:  Have certain infections during pregnancy such as a bladder infection, sexually transmitted infection, or infection inside the uterus (chorioamnionitis).  Have a shorter-than-normal cervix.  Have gone into preterm labor before.  Have had surgery on their cervix.  Are younger than age 64 or older than age 26.  Are African American.  Are pregnant with twins or multiple babies (multiple gestation).  Take street drugs or smoke while pregnant.  Do not gain enough weight while pregnant.  Became pregnant shortly after having been pregnant. What are the symptoms of preterm labor? Symptoms of preterm labor include:  Cramps similar to those that can happen during a menstrual period. The cramps may happen with diarrhea.  Pain in the abdomen or lower back.  Regular uterine contractions that may feel like tightening of the abdomen.  A feeling of increased pressure in the  pelvis.  Increased watery or bloody mucus discharge from the vagina.  Water breaking (ruptured amniotic sac). Why is it important to recognize signs of preterm labor? It is important to recognize signs of preterm labor because babies who are born prematurely may not be fully developed. This can put them at an increased risk for:  Long-term (chronic) heart and lung problems.  Difficulty immediately after birth with regulating body systems, including blood sugar, body temperature, heart rate, and breathing rate.  Bleeding in the brain.  Cerebral palsy.  Learning difficulties.  Death. These risks are highest for babies who are born before 38 weeks of pregnancy. How is preterm labor treated? Treatment depends on the length of your pregnancy, your condition, and the health of your baby. It may involve:  Having a stitch (suture) placed in your cervix to prevent your cervix from opening too early (cerclage).  Taking or being given medicines, such as: ? Hormone medicines. These may be given early in pregnancy to help support the pregnancy. ? Medicine to stop contractions. ? Medicines to help mature the baby's lungs. These may be prescribed if the risk of delivery is high. ? Medicines to prevent your baby from developing cerebral palsy. If the labor happens before 34 weeks of pregnancy, you may need to stay in the hospital. What should I do if I think I am in preterm labor? If you think that you are going into preterm labor, call your health care provider right away. How can I prevent preterm labor in future pregnancies? To increase your chance of having a full-term pregnancy:  Do not use any tobacco products, such as  cigarettes, chewing tobacco, and e-cigarettes. If you need help quitting, ask your health care provider.  Do not use street drugs or medicines that have not been prescribed to you during your pregnancy.  Talk with your health care provider before taking any herbal  supplements, even if you have been taking them regularly.  Make sure you gain a healthy amount of weight during your pregnancy.  Watch for infection. If you think that you might have an infection, get it checked right away.  Make sure to tell your health care provider if you have gone into preterm labor before. This information is not intended to replace advice given to you by your health care provider. Make sure you discuss any questions you have with your health care provider. Document Revised: 10/15/2018 Document Reviewed: 11/14/2015 Elsevier Patient Education  [place].         Group B Streptococcus Test During Pregnancy Why am I having this test? Routine testing, also called screening, for group B streptococcus (GBS) is recommended for all pregnant women between the 36th and 37th week of pregnancy. GBS is a type of bacteria that can be passed from mother to baby during childbirth. Screening will help guide whether or not you will need treatment during labor and delivery to prevent complications such as:  An infection in your uterus during labor.  An infection in your uterus after delivery.  A serious infection in your baby after delivery, such as pneumonia, meningitis, or sepsis. GBS screening is not often done before 36 weeks of pregnancy unless you go into labor prematurely. What happens if I have group B streptococcus? If testing shows that you have GBS, your health care provider will recommend treatment with IV antibiotics during labor and delivery. This treatment significantly decreases the risk of complications for you and your baby. If you have a planned C-section and you have GBS, you may not need to be treated with antibiotics because GBS is usually passed to babies after labor starts and your water breaks. If you are in labor or your water breaks before your C-section, it is possible for GBS to get into your uterus and be passed to your baby, so you might need  treatment. Is there a chance I may not need to be tested? You may not need to be tested for GBS if:  You have a urine test that shows GBS before 36 to 37 weeks.  You had a baby with GBS infection after a previous delivery. In these cases, you will automatically be treated for GBS during labor and delivery. What is being tested? This test is done to check if you have group B streptococcus in your vagina or rectum. What kind of sample is taken? To collect samples for this test, your health care provider will swab your vagina and rectum with a cotton swab. The sample is then sent to the lab to see if GBS is present. What happens during the test?   You will remove your clothing from the waist down.  You will lie down on an exam table in the same position as you would for a pelvic exam.  Your health care provider will swab your vagina and rectum to collect samples for a culture test.  You will be able to go home after the test and do all your usual activities. How are the results reported? The test results are reported as positive or negative. What do the results mean?  A positive test means you are  at risk for passing GBS to your baby during labor and delivery. Your health care provider will recommend that you are treated with an IV antibiotic during labor and delivery.  A negative test means you are at very low risk of passing GBS to your baby. There is still a low risk of passing GBS to your baby because sometimes test results may report that you do not have a condition when you do (false-negative result) or there is a chance that you may become infected with GBS after the test is done. You most likely will not need to be treated with an antibiotic during labor and delivery. Talk with your health care provider about what your results mean. Questions to ask your health care provider Ask your health care provider, or the department that is doing the test:  When will my results be  ready?  How will I get my results?  What are my treatment options? Summary  Routine testing (screening) for group B streptococcus (GBS) is recommended for all pregnant women between the 36th and 37th week of pregnancy.  GBS is a type of bacteria that can be passed from mother to baby during childbirth.  If testing shows that you have GBS, your health care provider will recommend that you are treated with IV antibiotics during labor and delivery. This treatment almost always prevents infection in newborns. This information is not intended to replace advice given to you by your health care provider. Make sure you discuss any questions you have with your health care provider. Document Revised: 10/14/2018 Document Reviewed: 07/21/2018 Elsevier Patient Education  [place].

## 2019-12-28 NOTE — Progress Notes (Signed)
Case reviewed with integrated behavioral health team and recommendations were made for Rx abilify and trazodone. Both prescriptions were e-prescribed. Patient will follow up in 1 week to assess symptoms

## 2019-12-28 NOTE — Progress Notes (Signed)
ROB, c/o pelvic and back pain 5-9/10 x 4 days, NV, migraines, blurry vision x 2 weeks.  Denies dysuria, fever, chills.

## 2020-01-03 ENCOUNTER — Inpatient Hospital Stay (HOSPITAL_COMMUNITY)
Admission: AD | Admit: 2020-01-03 | Discharge: 2020-01-03 | Disposition: A | Discharge status: Home / Self Care | Payer: 59 | Attending: Obstetrics and Gynecology | Admitting: Obstetrics and Gynecology

## 2020-01-03 ENCOUNTER — Other Ambulatory Visit: Payer: Self-pay

## 2020-01-03 ENCOUNTER — Encounter (HOSPITAL_COMMUNITY): Payer: Self-pay | Admitting: Obstetrics and Gynecology

## 2020-01-03 DIAGNOSIS — Z833 Family history of diabetes mellitus: Secondary | ICD-10-CM | POA: Diagnosis not present

## 2020-01-03 DIAGNOSIS — Z803 Family history of malignant neoplasm of breast: Secondary | ICD-10-CM | POA: Diagnosis not present

## 2020-01-03 DIAGNOSIS — R109 Unspecified abdominal pain: Secondary | ICD-10-CM

## 2020-01-03 DIAGNOSIS — D649 Anemia, unspecified: Secondary | ICD-10-CM | POA: Diagnosis not present

## 2020-01-03 DIAGNOSIS — O26899 Other specified pregnancy related conditions, unspecified trimester: Secondary | ICD-10-CM

## 2020-01-03 DIAGNOSIS — O26893 Other specified pregnancy related conditions, third trimester: Secondary | ICD-10-CM | POA: Diagnosis not present

## 2020-01-03 DIAGNOSIS — R1013 Epigastric pain: Secondary | ICD-10-CM | POA: Insufficient documentation

## 2020-01-03 DIAGNOSIS — Z8249 Family history of ischemic heart disease and other diseases of the circulatory system: Secondary | ICD-10-CM | POA: Diagnosis not present

## 2020-01-03 DIAGNOSIS — Z87891 Personal history of nicotine dependence: Secondary | ICD-10-CM | POA: Diagnosis not present

## 2020-01-03 DIAGNOSIS — Z888 Allergy status to other drugs, medicaments and biological substances status: Secondary | ICD-10-CM | POA: Insufficient documentation

## 2020-01-03 DIAGNOSIS — Z853 Personal history of malignant neoplasm of breast: Secondary | ICD-10-CM | POA: Diagnosis not present

## 2020-01-03 DIAGNOSIS — Z87440 Personal history of urinary (tract) infections: Secondary | ICD-10-CM | POA: Diagnosis not present

## 2020-01-03 DIAGNOSIS — Z3A34 34 weeks gestation of pregnancy: Secondary | ICD-10-CM | POA: Diagnosis not present

## 2020-01-03 DIAGNOSIS — Z79899 Other long term (current) drug therapy: Secondary | ICD-10-CM | POA: Diagnosis not present

## 2020-01-03 LAB — URINALYSIS, ROUTINE W REFLEX MICROSCOPIC
Bacteria, UA: NONE SEEN
Bilirubin Urine: NEGATIVE
Glucose, UA: NEGATIVE mg/dL
Hgb urine dipstick: NEGATIVE
Ketones, ur: 20 mg/dL — AB
Nitrite: NEGATIVE
Protein, ur: NEGATIVE mg/dL
Specific Gravity, Urine: 1.014 (ref 1.005–1.030)
pH: 6 (ref 5.0–8.0)

## 2020-01-03 LAB — CBC
HCT: 30.7 % — ABNORMAL LOW (ref 36.0–46.0)
Hemoglobin: 9.8 g/dL — ABNORMAL LOW (ref 12.0–15.0)
MCH: 28.7 pg (ref 26.0–34.0)
MCHC: 31.9 g/dL (ref 30.0–36.0)
MCV: 90 fL (ref 80.0–100.0)
Platelets: 216 10*3/uL (ref 150–400)
RBC: 3.41 MIL/uL — ABNORMAL LOW (ref 3.87–5.11)
RDW: 13.6 % (ref 11.5–15.5)
WBC: 11.8 10*3/uL — ABNORMAL HIGH (ref 4.0–10.5)
nRBC: 0 % (ref 0.0–0.2)

## 2020-01-03 NOTE — MAU Note (Signed)
.   Michaela Williams is a 24 y.o. at [redacted]w[redacted]d here in MAU reporting: dizziness and sharp pain in her lower abdomen that started today. Denies any VB or LOF. +FM  Onset of complaint: today, can't state a time Pain score: 8 Vitals:   01/03/20 1848  BP: 108/70  Pulse: (!) 110  Resp: 18  Temp: 98.3 F (36.8 C)  SpO2: 99%     FHT:143 Lab orders placed from triage: ua
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
## 2020-01-04 ENCOUNTER — Telehealth: Payer: Self-pay | Admitting: Clinical

## 2020-01-04 ENCOUNTER — Ambulatory Visit: Payer: 59 | Attending: Women's Health

## 2020-01-04 DIAGNOSIS — Z3A35 35 weeks gestation of pregnancy: Secondary | ICD-10-CM | POA: Diagnosis not present

## 2020-01-04 DIAGNOSIS — O4693 Antepartum hemorrhage, unspecified, third trimester: Secondary | ICD-10-CM | POA: Diagnosis not present

## 2020-01-04 DIAGNOSIS — O26843 Uterine size-date discrepancy, third trimester: Secondary | ICD-10-CM

## 2020-01-04 DIAGNOSIS — O26893 Other specified pregnancy related conditions, third trimester: Secondary | ICD-10-CM | POA: Diagnosis not present

## 2020-01-04 NOTE — Telephone Encounter (Signed)
[facility] Management Phone Note  MRN: [id number] NAME: Michaela Williams  Attempt [facility] call: "Call cannot be completed at this time", so unable to leave voice message.   [name] Dyanne Yorks, LCSW

## 2020-01-05 ENCOUNTER — Telehealth: Payer: Self-pay | Admitting: Clinical

## 2020-01-05 NOTE — Telephone Encounter (Signed)
"  Call cannot be completed at this time", so unable to leave voice message; Pt needs f/u visit with [facility] Michaela Williams at [facility] scheduled as soon as possible.

## 2020-01-08 DIAGNOSIS — H5213 Myopia, bilateral: Secondary | ICD-10-CM | POA: Diagnosis not present

## 2020-01-11 ENCOUNTER — Encounter: Payer: 59 | Admitting: Obstetrics and Gynecology

## 2020-01-11 ENCOUNTER — Encounter: Payer: Self-pay | Admitting: Obstetrics and Gynecology

## 2020-01-11 ENCOUNTER — Other Ambulatory Visit (HOSPITAL_COMMUNITY)
Admission: RE | Admit: 2020-01-11 | Discharge: 2020-01-11 | Disposition: A | Discharge status: Home / Self Care | Payer: 59 | Source: Ambulatory Visit | Attending: Obstetrics and Gynecology | Admitting: Obstetrics and Gynecology

## 2020-01-11 ENCOUNTER — Other Ambulatory Visit: Payer: Self-pay

## 2020-01-11 ENCOUNTER — Ambulatory Visit (INDEPENDENT_AMBULATORY_CARE_PROVIDER_SITE_OTHER): Payer: 59 | Admitting: Obstetrics and Gynecology

## 2020-01-11 VITALS — BP 109/72 | HR 108 | Wt 159.2 lb

## 2020-01-11 DIAGNOSIS — Z3480 Encounter for supervision of other normal pregnancy, unspecified trimester: Secondary | ICD-10-CM | POA: Insufficient documentation

## 2020-01-11 DIAGNOSIS — Z3A36 36 weeks gestation of pregnancy: Secondary | ICD-10-CM
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
## 2020-01-11 NOTE — Progress Notes (Signed)
Pt is here for ROB, [redacted]w[redacted]d.

## 2020-01-12 LAB — CERVICOVAGINAL ANCILLARY ONLY
Chlamydia: NEGATIVE
Comment: NEGATIVE
Comment: NORMAL
Neisseria Gonorrhea: NEGATIVE

## 2020-01-13 LAB — STREP GP B NAA: Strep Gp B NAA: NEGATIVE

## 2020-01-19 ENCOUNTER — Other Ambulatory Visit: Payer: Self-pay

## 2020-01-19 ENCOUNTER — Ambulatory Visit (INDEPENDENT_AMBULATORY_CARE_PROVIDER_SITE_OTHER): Payer: 59 | Admitting: Obstetrics and Gynecology

## 2020-01-19 VITALS — BP 110/79 | HR 182 | Temp 98.7°F | Wt 162.0 lb

## 2020-01-19 DIAGNOSIS — O99013 Anemia complicating pregnancy, third trimester: Secondary | ICD-10-CM

## 2020-01-19 DIAGNOSIS — Z3A37 37 weeks gestation of pregnancy: Secondary | ICD-10-CM

## 2020-01-19 DIAGNOSIS — O26893 Other specified pregnancy related conditions, third trimester: Secondary | ICD-10-CM

## 2020-01-19 DIAGNOSIS — Z3480 Encounter for supervision of other normal pregnancy, unspecified trimester: Secondary | ICD-10-CM

## 2020-01-19 DIAGNOSIS — D649 Anemia, unspecified: Secondary | ICD-10-CM

## 2020-01-19 DIAGNOSIS — Z62810 Personal history of physical and sexual abuse in childhood: Secondary | ICD-10-CM

## 2020-01-19 DIAGNOSIS — Z8659 Personal history of other mental and behavioral disorders: Secondary | ICD-10-CM

## 2020-01-19 DIAGNOSIS — F314 Bipolar disorder, current episode depressed, severe, without psychotic features: Secondary | ICD-10-CM

## 2020-01-19 DIAGNOSIS — R519 Headache, unspecified: Secondary | ICD-10-CM

## 2020-01-19 DIAGNOSIS — F3181 Bipolar II disorder: Secondary | ICD-10-CM

## 2020-01-19 DIAGNOSIS — O99343 Other mental disorders complicating pregnancy, third trimester: Secondary | ICD-10-CM

## 2020-01-19 DIAGNOSIS — O26843 Uterine size-date discrepancy, third trimester: Secondary | ICD-10-CM
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
## 2020-01-23 ENCOUNTER — Telehealth: Payer: Self-pay | Admitting: Clinical

## 2020-01-23 NOTE — Telephone Encounter (Signed)
3rd attempt to contact pt to set up f/u appointment, "call cannot be completed at this time".

## 2020-01-26 ENCOUNTER — Ambulatory Visit (INDEPENDENT_AMBULATORY_CARE_PROVIDER_SITE_OTHER): Payer: 59 | Admitting: Women's Health

## 2020-01-26 ENCOUNTER — Encounter: Payer: Self-pay | Admitting: Women's Health

## 2020-01-26 ENCOUNTER — Other Ambulatory Visit: Payer: Self-pay

## 2020-01-26 ENCOUNTER — Telehealth: Payer: Self-pay | Admitting: Clinical

## 2020-01-26 VITALS — BP 124/82 | HR 92 | Wt 162.0 lb

## 2020-01-26 DIAGNOSIS — O99013 Anemia complicating pregnancy, third trimester: Secondary | ICD-10-CM

## 2020-01-26 DIAGNOSIS — Z9189 Other specified personal risk factors, not elsewhere classified: Secondary | ICD-10-CM

## 2020-01-26 DIAGNOSIS — Z3480 Encounter for supervision of other normal pregnancy, unspecified trimester: Secondary | ICD-10-CM

## 2020-01-26 DIAGNOSIS — O26843 Uterine size-date discrepancy, third trimester: Secondary | ICD-10-CM

## 2020-01-26 DIAGNOSIS — D649 Anemia, unspecified: Secondary | ICD-10-CM

## 2020-01-26 DIAGNOSIS — Z3A38 38 weeks gestation of pregnancy: Secondary | ICD-10-CM

## 2020-01-26 NOTE — Telephone Encounter (Signed)
Follow up call: pt states she is not taking BH medications, and prefers to begin postpartum. Pt verbally consents for [facility] to talk to Clinical Social Workers inpatient at [facility]  to check on pt mood after delivery. Pt agrees to postpartum mood check with [facility] for [facility] at [facility] on 02/23/20; May call at either [phone] (Keatin Benham's direct number) or [phone] (main office) to reschedule, if needed.
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
## 2020-01-27 ENCOUNTER — Encounter (HOSPITAL_COMMUNITY): Payer: Self-pay | Admitting: Obstetrics and Gynecology

## 2020-01-27 ENCOUNTER — Inpatient Hospital Stay (HOSPITAL_COMMUNITY)
Admission: AD | Admit: 2020-01-27 | Discharge: 2020-01-28 | Disposition: A | Discharge status: Home / Self Care | Payer: 59 | Attending: Obstetrics and Gynecology | Admitting: Obstetrics and Gynecology

## 2020-01-27 DIAGNOSIS — Z20822 Contact with and (suspected) exposure to covid-19: Secondary | ICD-10-CM | POA: Insufficient documentation

## 2020-01-27 DIAGNOSIS — O26893 Other specified pregnancy related conditions, third trimester: Secondary | ICD-10-CM | POA: Insufficient documentation

## 2020-01-27 DIAGNOSIS — O471 False labor at or after 37 completed weeks of gestation: Secondary | ICD-10-CM

## 2020-01-27 DIAGNOSIS — O479 False labor, unspecified: Secondary | ICD-10-CM | POA: Diagnosis not present

## 2020-01-27 DIAGNOSIS — R531 Weakness: Secondary | ICD-10-CM | POA: Diagnosis not present

## 2020-01-27 DIAGNOSIS — Z87891 Personal history of nicotine dependence: Secondary | ICD-10-CM | POA: Insufficient documentation

## 2020-01-27 DIAGNOSIS — Z853 Personal history of malignant neoplasm of breast: Secondary | ICD-10-CM | POA: Insufficient documentation

## 2020-01-27 DIAGNOSIS — O99513 Diseases of the respiratory system complicating pregnancy, third trimester: Secondary | ICD-10-CM | POA: Insufficient documentation

## 2020-01-27 DIAGNOSIS — F329 Major depressive disorder, single episode, unspecified: Secondary | ICD-10-CM | POA: Insufficient documentation

## 2020-01-27 DIAGNOSIS — J45909 Unspecified asthma, uncomplicated: Secondary | ICD-10-CM | POA: Insufficient documentation

## 2020-01-27 DIAGNOSIS — Z3A38 38 weeks gestation of pregnancy: Secondary | ICD-10-CM | POA: Diagnosis not present

## 2020-01-27 DIAGNOSIS — R519 Headache, unspecified: Secondary | ICD-10-CM | POA: Insufficient documentation

## 2020-01-27 DIAGNOSIS — R42 Dizziness and giddiness: Secondary | ICD-10-CM | POA: Diagnosis not present

## 2020-01-27 DIAGNOSIS — Z3689 Encounter for other specified antenatal screening: Secondary | ICD-10-CM

## 2020-01-27 DIAGNOSIS — I959 Hypotension, unspecified: Secondary | ICD-10-CM | POA: Diagnosis not present

## 2020-01-27 DIAGNOSIS — O99343 Other mental disorders complicating pregnancy, third trimester: Secondary | ICD-10-CM | POA: Insufficient documentation

## 2020-01-27 DIAGNOSIS — Z79899 Other long term (current) drug therapy: Secondary | ICD-10-CM | POA: Insufficient documentation

## 2020-01-27 DIAGNOSIS — G4489 Other headache syndrome: Secondary | ICD-10-CM | POA: Diagnosis not present

## 2020-01-27 LAB — CBC
Hematocrit: 33.4 % — ABNORMAL LOW (ref 34.0–46.6)
Hemoglobin: 11 g/dL — ABNORMAL LOW (ref 11.1–15.9)
MCH: 28.9 pg (ref 26.6–33.0)
MCHC: 32.9 g/dL (ref 31.5–35.7)
MCV: 88 fL (ref 79–97)
Platelets: 214 10*3/uL (ref 150–450)
RBC: 3.81 x10E6/uL (ref 3.77–5.28)
RDW: 13.7 % (ref 11.7–15.4)
WBC: 10.9 10*3/uL — ABNORMAL HIGH (ref 3.4–10.8)

## 2020-01-27 MED ORDER — ACETAMINOPHEN 500 MG PO TABS
1000.0000 mg | ORAL_TABLET | Freq: Once | ORAL | Status: AC
  Administered 2020-01-27: 1000 mg via ORAL
  Filled 2020-01-27: qty 2

## 2020-01-27 NOTE — MAU Note (Addendum)
EMS arrival . Mild ctx off and on all day/ got very strong about 1 hr ago.Pt husband stated that they where getting in the car and her mom was holding her and she had a bad contraction and yelled and then her saw her on her back on the ground ( mom lowered her down. Pt was unsure what had happened just remembered being the ground. (possible passed out?) Denies any vag bleeding or leaking at this time. Good fetal movement reported. Also c/o a bad headache since  Started before she came to the hospital around 8:40.

## 2020-01-27 NOTE — MAU Provider Note (Signed)
History     CSN: [id number]  Arrival date and time: 01/27/20 2215   First Provider Initiated Contact with Patient 01/27/20 2249      Chief Complaint  Patient presents with  . Fall  . Contractions  . Headache   Michaela Williams is a 24 y.o. G2P1001 at 56w2dwho receives care at [facility].  She presents today for Fall, Contractions, and Headache. Patient's SO reports they were about to get in the car to go to the hospital.  He reports she was being helped into the car and "yelled contraction and then was guided down onto the grass by her mother."  Patient denies hitting her abdomen or head during this incident.  Patient reports that she has been experiencing a headache for about one hour that she is unsure of whether she was having prior to the contractions.  Patient endorses fetal movement and contractions, but is unsure of how often they are occurring.  SO reports onset was about 0830.     OB History    Gravida  2   Para  1   Term  1   Preterm  0   AB  0   Living  1     SAB  0   TAB  0   Ectopic  0   Multiple  0   Live Births  1           Past Medical History:  Diagnosis Date  . Anemia   . Asthma   . breast ca dx'd 05/2017   left  . Depression    no meds in 3 yrs  . Headache   . Infection    UTI  . Seizures ([place] 2014   "seizure like activity", reaction to medicaiton, none since    Past Surgical History:  Procedure Laterality Date  . ESOPHAGOGASTRODUODENOSCOPY (EGD) WITH PROPOFOL N/A 06/03/2017   Procedure: ESOPHAGOGASTRODUODENOSCOPY (EGD) WITH PROPOFOL;  Surgeon: SLadene Artist MD;  Location: [facility];  Service: Endoscopy;  Laterality: N/A;    Family History  Adopted: Yes  Problem Relation Age of Onset  . Mental illness Mother   . Cancer Mother   . Breast cancer Mother   . Cancer Maternal Grandmother   . Hypertension Maternal Grandmother   . Diabetes Maternal Grandmother   . Cancer Maternal Aunt   . Breast cancer Maternal Aunt   .  Breast cancer Sister   . Breast cancer Maternal Aunt   . Breast cancer Maternal Aunt   . Breast cancer Maternal Aunt   . Breast cancer Maternal Aunt     Social History   Tobacco Use  . Smoking status: Former Smoker    Types: Cigarettes    Quit date: 12/19/2014    Years since quitting: 5.1  . Smokeless tobacco: Never Used  . Tobacco comment: age 24 Vaping Use  . Vaping Use: Former  Substance Use Topics  . Alcohol use: Not Currently    Comment: occasionally  . Drug use: No    Allergies:  Allergies  Allergen Reactions  . Flexeril [Cyclobenzaprine]     Patient had a near syncopal event following Flexeril PO in MAU 11/21/2019  . Lexapro [Escitalopram] Other (See Comments)    Didn't like how it made her feel  . Lithium Other (See Comments)    SEIZURES, Childhood reaction    Medications Prior to Admission  Medication Sig Dispense Refill Last Dose  . ferrous sulfate 325 (65 FE) MG tablet Take 1  tablet (325 mg total) by mouth daily. 30 tablet 3 01/27/2020 at Unknown time  . Prenatal Vit-Fe Fumarate-FA (PRENATAL PO) Take by mouth.   01/27/2020 at Unknown time  . traZODone (DESYREL) 50 MG tablet Take 0.5 tablets (25 mg total) by mouth at bedtime as needed for sleep. 30 tablet 3 Past Week at Unknown time  . ARIPiprazole (ABILIFY) 2 MG tablet Take 1 tablet (2 mg total) by mouth daily. (Patient not taking: Reported on 01/11/2020) 30 tablet 3   . Blood Pressure Monitoring (BLOOD PRESSURE KIT) DEVI 1 kit by Does not apply route once a week. Check Blood Pressure regularly and record readings into the Babyscripts App.  Large Cuff.  DX O90.0 1 each 0   . Elastic Bandages & Supports (COMFORT FIT MATERNITY SUPP SM) MISC Wear as directed. 1 each 0     Review of Systems  Eyes: Negative for visual disturbance.  Respiratory: Negative for cough and shortness of breath.   Gastrointestinal: Positive for nausea (All day). Negative for vomiting.  Genitourinary: Negative for difficulty urinating,  dysuria, vaginal bleeding and vaginal discharge.  Neurological: Positive for headaches. Negative for dizziness and light-headedness.   Physical Exam   Blood pressure 115/69, pulse 101, temperature 98.4 F (36.9 C), resp. rate 18, last menstrual period 05/04/2019.  Physical Exam Vitals reviewed.  Constitutional:      Appearance: She is well-developed.  HENT:     Head: Normocephalic and atraumatic.  Pulmonary:     Effort: Pulmonary effort is normal. No respiratory distress.  Abdominal:     Comments: Gravid--fundal height appears AGA, Soft resting tone.  Mild tenderness with contractions.    Genitourinary:    Comments: Cervical Exam: 4.5/50/-3 Skin:    General: Skin is warm and dry.  Neurological:     Mental Status: She is alert and oriented to person, place, and time.  Psychiatric:        Mood and Affect: Mood is anxious and depressed.        Speech: Speech normal.     Fetal Assessment 120 bpm, Mod Var, -Decels, +Accels Toco: Ctx palpate moderate to strong  MAU Course   Results for orders placed or performed during the hospital encounter of 01/27/20 (from the past 24 hour(s))  SARS Coronavirus 2 by RT PCR (hospital order, performed in [facility] hospital lab) Nasopharyngeal Nasopharyngeal Swab     Status: None   Collection Time: 01/28/20  2:06 AM   Specimen: Nasopharyngeal Swab  Result Value Ref Range   SARS Coronavirus 2 NEGATIVE NEGATIVE   No results found.  MDM PE EFM Pain Medication Assessment and Plan  24 year old G2P1001  SIUP at 38.2weeks Cat I FT Headache Contractions  -POC Reviewed.  -Exam performed with extensive prompting-patient tolerated well.  -Findings discussed. -Will give tylenol for headache. -Exam as above. -Will monitor and reassess. -NST reactive.   [name] MSN, CNM 01/27/2020, 10:49 PM   Reassessment (1:55 AM)  -Cervical exam with minimal change after 2 hours. -Patient reports improvement in HA.  -Discussed discharge  to home and patient agreeable -Reviewed labor precautions.  -Patient offered and accepts pain medication. -Will give Morphine 62m IV now. -Nurse informed okay to complete Covid test now.  -Encouraged to call or return to MAU if symptoms worsen or with the onset of new symptoms. -Discharged to home in stable condition.  JMaryann ConnersMSN, CNM Advanced Practice Provider, [facility] [employer] [facility]

## 2020-01-28 DIAGNOSIS — O26893 Other specified pregnancy related conditions, third trimester: Secondary | ICD-10-CM | POA: Diagnosis not present

## 2020-01-28 DIAGNOSIS — Z3A38 38 weeks gestation of pregnancy: Secondary | ICD-10-CM | POA: Diagnosis not present

## 2020-01-28 DIAGNOSIS — O99343 Other mental disorders complicating pregnancy, third trimester: Secondary | ICD-10-CM | POA: Diagnosis not present

## 2020-01-28 DIAGNOSIS — Z79899 Other long term (current) drug therapy: Secondary | ICD-10-CM | POA: Diagnosis not present

## 2020-01-28 DIAGNOSIS — Z87891 Personal history of nicotine dependence: Secondary | ICD-10-CM | POA: Diagnosis not present

## 2020-01-28 DIAGNOSIS — J45909 Unspecified asthma, uncomplicated: Secondary | ICD-10-CM | POA: Diagnosis not present

## 2020-01-28 DIAGNOSIS — R519 Headache, unspecified: Secondary | ICD-10-CM | POA: Diagnosis not present

## 2020-01-28 DIAGNOSIS — F329 Major depressive disorder, single episode, unspecified: Secondary | ICD-10-CM | POA: Diagnosis not present

## 2020-01-28 DIAGNOSIS — Z853 Personal history of malignant neoplasm of breast: Secondary | ICD-10-CM | POA: Diagnosis not present

## 2020-01-28 DIAGNOSIS — Z20822 Contact with and (suspected) exposure to covid-19: Secondary | ICD-10-CM | POA: Diagnosis not present

## 2020-01-28 DIAGNOSIS — O99513 Diseases of the respiratory system complicating pregnancy, third trimester: Secondary | ICD-10-CM | POA: Diagnosis not present

## 2020-01-28 LAB — SARS CORONAVIRUS 2 BY RT PCR (HOSPITAL ORDER, PERFORMED IN ~~LOC~~ HOSPITAL LAB): SARS Coronavirus 2: NEGATIVE

## 2020-01-28 MED ORDER — MORPHINE SULFATE (PF) 4 MG/ML IV SOLN
4.0000 mg | Freq: Once | INTRAVENOUS | Status: AC
  Administered 2020-01-28: 4 mg via INTRAVENOUS
  Filled 2020-01-28: qty 1

## 2020-01-28 NOTE — Discharge Instructions (Signed)

## 2020-01-29 ENCOUNTER — Inpatient Hospital Stay (HOSPITAL_COMMUNITY)
Admission: AD | Admit: 2020-01-29 | Discharge: 2020-01-29 | Disposition: A | Discharge status: Home / Self Care | Payer: 59 | Attending: Obstetrics & Gynecology | Admitting: Obstetrics & Gynecology

## 2020-01-29 ENCOUNTER — Encounter (HOSPITAL_COMMUNITY): Payer: Self-pay | Admitting: Obstetrics & Gynecology

## 2020-01-29 ENCOUNTER — Other Ambulatory Visit: Payer: Self-pay

## 2020-01-29 DIAGNOSIS — Z3A38 38 weeks gestation of pregnancy: Secondary | ICD-10-CM

## 2020-01-29 DIAGNOSIS — Z3689 Encounter for other specified antenatal screening: Secondary | ICD-10-CM

## 2020-01-29 DIAGNOSIS — R110 Nausea: Secondary | ICD-10-CM | POA: Insufficient documentation

## 2020-01-29 DIAGNOSIS — O471 False labor at or after 37 completed weeks of gestation: Secondary | ICD-10-CM

## 2020-01-29 DIAGNOSIS — O26893 Other specified pregnancy related conditions, third trimester: Secondary | ICD-10-CM | POA: Diagnosis not present

## 2020-01-29 NOTE — MAU Note (Signed)
I have communicated with Gavin Pound, CNM and reviewed vital signs:  Vitals:   01/29/20 0202 01/29/20 0216  BP: 114/81 123/74  Pulse: (!) 110 104  Resp: 20   Temp: 98.8 F (37.1 C)   SpO2: 100%     Vaginal exam:  Dilation: 4.5 Effacement (%): 50 Cervical Position: Posterior Station: -3 Presentation: Vertex Exam by:: Gavin Pound, CNM,   Also reviewed contraction pattern and that non-stress test is reactive.  It has been documented that patient is contracting every 6-9 minutes with no cervical change since she was here yesterday not indicating active labor.  Patient denies any other complaints. The patient wished to go home rather than stay for an hour and be monitored.  Based on this report provider has given order for discharge.  A discharge order and diagnosis entered by a provider.   Labor discharge instructions reviewed with patient.

## 2020-01-29 NOTE — MAU Provider Note (Addendum)
None    S: Ms. LESLEA VOWLES is a 24 y.o. G2P1001 at [redacted]w[redacted]d  who presents to MAU today complaining of contractions "for several hours." She denies vaginal bleeding. She denies LOF. She reports normal fetal movement. Of note, patient does not appear to be in acute distress.  Patient does reports some nausea.   O: BP 123/74   Pulse 104   Temp 98.8 F (37.1 C) (Oral)   Resp 20   LMP 05/04/2019 (Exact Date)   SpO2 100%  GENERAL: Well-developed, well-nourished female in no acute distress.  HEAD: Normocephalic, atraumatic.  CHEST: Normal effort of breathing, regular heart rate ABDOMEN: Soft, nontender, gravid  Cervical exam:  Dilation: 4.5 Effacement (%): Thick Cervical Position: Posterior Station: -3 Presentation: Vertex Exam by:: Gavin Pound, CNM   Fetal Monitoring: Baseline: 135 Variability: Moderate Accelerations: Present 15x15 Decelerations: None Contractions: Q6-71min   A: SIUP at [redacted]w[redacted]d  False labor  NST Reactive Nausea  P: -Given Early Labor Options:  A: Home with or w/o therapeutic rest B: Ambulate and reassess C: Pain medication and reassess  -Patient opts for discharge and declines medication. -Offered and declines antiemetic.  -Brief discussion with patient and SO regarding Labor. -Encouraged to use self-awareness and observation when attempting to decide when to report to hospital. -Reassured that many false alarms may occur before actual labor diagnosed. -Encouraged to call or return to MAU if symptoms worsen or with the onset of new symptoms. -Discharged to home in stable condition.   Gavin Pound, CNM 01/29/2020 2:43 AM

## 2020-01-29 NOTE — MAU Note (Signed)
Pt here with reports of contractions every few minutes for the past several hours. She denies LOF or vaginal bleeding. Reports good fetal movement. Cervix 4.5cm last night.

## 2020-01-30 NOTE — Patient Instructions (Signed)
Suicidal Feelings: How to Help Yourself Suicide is when you end your own life. There are many things you can do to help yourself feel better when struggling with these feelings. Many services and people are available to support you and others who struggle with similar feelings.  If you ever feel like you may hurt yourself or others, or have thoughts about taking your own life, get help right away. To get help:  Call your local emergency services (911 in the [place]  The [employer]'s health and human services helpline (211 in the [place]  Go to your nearest emergency department.  Call a suicide hotline to speak with a trained counselor. The following suicide hotlines are available in the [place]: ? 1-800-273-TALK (1-800-273-8255). ? 1-800-SUICIDE (1-800-784-2433). ? 1-888-628-9454. This is a hotline for Spanish speakers. ? 1-800-799-4889. This is a hotline for TTY users. ? 1-866-4-U-TREVOR (1-866-488-7386). This is a hotline for lesbian, gay, bisexual, transgender, or questioning youth. ? For a list of hotlines in [place], visit www.suicide.org/hotlines/international/[place]-suicide-hotlines.html  Contact a crisis center or a local suicide prevention center. To find a crisis center or suicide prevention center: ? Call your local hospital, clinic, community service organization, mental health center, social service provider, or health department. Ask for help with connecting to a crisis center. ? For a list of crisis centers in the [place], visit: suicidepreventionlifeline.org ? For a list of crisis centers in [place], visit: suicideprevention.ca How to help yourself feel better   Promise yourself that you will not do anything extreme when you have suicidal feelings. Remember, there is hope. Many people have gotten through suicidal thoughts and feelings, and you can too. If you have had these feelings before, remind yourself that you can get through them again.  Let family, friends,  teachers, or counselors know how you are feeling. Try not to separate yourself from those who care about you and want to help you. Talk with someone every day, even if you do not feel sociable. Face-to-face conversation is best to help them understand your feelings.  Contact a mental health care provider and work with this person regularly.  Make a safety plan that you can follow during a crisis. Include phone numbers of suicide prevention hotlines, mental health professionals, and trusted friends and family members you can call during an emergency. Save these numbers on your phone.  If you are thinking of taking a lot of medicine, give your medicine to someone who can give it to you as prescribed. If you are on antidepressants and are concerned you will overdose, tell your health care provider so that he or she can give you safer medicines.  Try to stick to your routines. Follow a schedule every day. Make self-care a priority.  Make a list of realistic goals, and cross them off when you achieve them. Accomplishments can give you a sense of worth.  Wait until you are feeling better before doing things that you find difficult or unpleasant.  Do things that you have always enjoyed to take your mind off your feelings. Try reading a book, or listening to or playing music. Spending time outside, in nature, may help you feel better. Follow these instructions at home:   Visit your primary health care provider every year for a checkup.  Work with a mental health care provider as needed.  Eat a well-balanced diet, and eat regular meals.  Get plenty of rest.  Exercise if you are able. Just 30 minutes of exercise each day can   help you feel better.  Take over-the-counter and prescription medicines only as told by your health care provider. Ask your mental health care provider about the possible side effects of any medicines you are taking.  Do not use alcohol or drugs, and remove these substances  from your home.  Remove weapons, poisons, knives, and other deadly items from your home. General recommendations  Keep your living space well lit.  When you are feeling well, write yourself a letter with tips and support that you can read when you are not feeling well.  Remember that life's difficulties can be sorted out with help. Conditions can be treated, and you can learn behaviors and ways of thinking that will help you. Where to find more information  National Suicide Prevention Lifeline: www.suicidepreventionlifeline.org  Hopeline: www.hopeline.[place] for Suicide Prevention: PromotionalLoans.co.za  [employer] (for lesbian, gay, bisexual, transgender, or questioning youth): www.thetrevorproject.org Contact a health care provider if:  You feel as though you are a burden to others.  You feel agitated, angry, vengeful, or have extreme mood swings.  You have withdrawn from family and friends. Get help right away if:  You are talking about suicide or wishing to die.  You start making plans for how to commit suicide.  You feel that you have no reason to live.  You start making plans for putting your affairs in order, saying goodbye, or giving your possessions away.  You feel guilt, shame, or unbearable pain, and it seems like there is no way out.  You are frequently using drugs or alcohol.  You are engaging in risky behaviors that could lead to death. If you have any of these symptoms, get help right away. Call emergency services, go to your nearest emergency department or crisis center, or call a suicide crisis helpline. Summary  Suicide is when you take your own life.  Promise yourself that you will not do anything extreme when you have suicidal feelings.  Let family, friends, teachers, or counselors know how you are feeling.  Get help right away if you feel as though life is getting too tough to handle and you are thinking about suicide. This  information is not intended to replace advice given to you by your health care provider. Make sure you discuss any questions you have with your health care provider. Document Revised: 10/14/2018 Document Reviewed: 02/03/2017 Elsevier Patient Education  [place].        Signs and Symptoms of Labor Labor is your body's natural process of moving your baby, placenta, and umbilical cord out of your uterus. The process of labor usually starts when your baby is full-term, between 64 and 40 weeks of pregnancy. How will I know when I am close to going into labor? As your body prepares for labor and the birth of your baby, you may notice the following symptoms in the weeks and days before true labor starts:  Having a strong desire to get your home ready to receive your new baby. This is called nesting. Nesting may be a sign that labor is approaching, and it may occur several weeks before birth. Nesting may involve cleaning and organizing your home.  Passing a small amount of thick, bloody mucus out of your vagina (normal bloody show or losing your mucus plug). This may happen more than a week before labor begins, or it might occur right before labor begins as the opening of the cervix starts to widen (dilate). For some women, the entire  mucus plug passes at once. For others, smaller portions of the mucus plug may gradually pass over several days.  Your baby moving (dropping) lower in your pelvis to get into position for birth (lightening). When this happens, you may feel more pressure on your bladder and pelvic bone and less pressure on your ribs. This may make it easier to breathe. It may also cause you to need to urinate more often and have problems with bowel movements.  Having "practice contractions" (Braxton Hicks contractions) that occur at irregular (unevenly spaced) intervals that are more than 10 minutes apart. This is also called false labor. False labor contractions are common after  exercise or sexual activity, and they will stop if you change position, rest, or drink fluids. These contractions are usually mild and do not get stronger over time. They may feel like: ? A backache or back pain. ? Mild cramps, similar to menstrual cramps. ? Tightening or pressure in your abdomen. Other early symptoms that labor may be starting soon include:  Nausea or loss of appetite.  Diarrhea.  Having a sudden burst of energy, or feeling very tired.  Mood changes.  Having trouble sleeping. How will I know when labor has begun? Signs that true labor has begun may include:  Having contractions that come at regular (evenly spaced) intervals and increase in intensity. This may feel like more intense tightening or pressure in your abdomen that moves to your back. ? Contractions may also feel like rhythmic pain in your upper thighs or back that comes and goes at regular intervals. ? For first-time mothers, this change in intensity of contractions often occurs at a more gradual pace. ? Women who have given birth before may notice a more rapid progression of contraction changes.  Having a feeling of pressure in the vaginal area.  Your water breaking (rupture of membranes). This is when the sac of fluid that surrounds your baby breaks. When this happens, you will notice fluid leaking from your vagina. This may be clear or blood-tinged. Labor usually starts within 24 hours of your water breaking, but it may take longer to begin. ? Some women notice this as a gush of fluid. ? Others notice that their underwear repeatedly becomes damp. Follow these instructions at home:   When labor starts, or if your water breaks, call your health care provider or nurse care line. Based on your situation, they will determine when you should go in for an exam.  When you are in early labor, you may be able to rest and manage symptoms at home. Some strategies to try at home include: ? Breathing and relaxation  techniques. ? Taking a warm bath or shower. ? Listening to music. ? Using a heating pad on the lower back for pain. If you are directed to use heat:  Place a towel between your skin and the heat source.  Leave the heat on for 20-30 minutes.  Remove the heat if your skin turns bright red. This is especially important if you are unable to feel pain, heat, or cold. You may have a greater risk of getting burned. Get help right away if:  You have painful, regular contractions that are 5 minutes apart or less.  Labor starts before you are [redacted] weeks along in your pregnancy.  You have a fever.  You have a headache that does not go away.  You have bright red blood coming from your vagina.  You do not feel your baby moving.  You  have a sudden onset of: ? Severe headache with vision problems. ? Nausea, vomiting, or diarrhea. ? Chest pain or shortness of breath. These symptoms may be an emergency. If your health care provider recommends that you go to the hospital or birth center where you plan to deliver, do not drive yourself. Have someone else drive you, or call emergency services (911 in the [place] Summary  Labor is your body's natural process of moving your baby, placenta, and umbilical cord out of your uterus.  The process of labor usually starts when your baby is full-term, between 58 and 40 weeks of pregnancy.  When labor starts, or if your water breaks, call your health care provider or nurse care line. Based on your situation, they will determine when you should go in for an exam. This information is not intended to replace advice given to you by your health care provider. Make sure you discuss any questions you have with your health care provider. Document Revised: 03/23/2017 Document Reviewed: 11/28/2016 Elsevier Patient Education  [place].

## 2020-01-31 ENCOUNTER — Ambulatory Visit (INDEPENDENT_AMBULATORY_CARE_PROVIDER_SITE_OTHER): Payer: 59 | Admitting: Obstetrics and Gynecology

## 2020-01-31 ENCOUNTER — Encounter: Payer: Self-pay | Admitting: Obstetrics and Gynecology

## 2020-01-31 ENCOUNTER — Other Ambulatory Visit: Payer: Self-pay

## 2020-01-31 VITALS — BP 120/84 | HR 123 | Wt 162.2 lb

## 2020-01-31 DIAGNOSIS — Z3483 Encounter for supervision of other normal pregnancy, third trimester: Secondary | ICD-10-CM

## 2020-01-31 DIAGNOSIS — Z3480 Encounter for supervision of other normal pregnancy, unspecified trimester: Secondary | ICD-10-CM
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
## 2020-01-31 NOTE — Patient Instructions (Signed)

## 2020-02-02 ENCOUNTER — Encounter (HOSPITAL_COMMUNITY): Payer: Self-pay | Admitting: Obstetrics and Gynecology

## 2020-02-02 ENCOUNTER — Telehealth: Payer: Self-pay | Admitting: Clinical

## 2020-02-02 ENCOUNTER — Inpatient Hospital Stay (HOSPITAL_COMMUNITY)
Admission: AD | Admit: 2020-02-02 | Discharge: 2020-02-02 | Disposition: A | Discharge status: Home / Self Care | Payer: 59 | Attending: Obstetrics and Gynecology | Admitting: Obstetrics and Gynecology

## 2020-02-02 ENCOUNTER — Encounter: Payer: 59 | Admitting: Obstetrics and Gynecology

## 2020-02-02 DIAGNOSIS — Z87440 Personal history of urinary (tract) infections: Secondary | ICD-10-CM | POA: Diagnosis not present

## 2020-02-02 DIAGNOSIS — Z888 Allergy status to other drugs, medicaments and biological substances status: Secondary | ICD-10-CM | POA: Diagnosis not present

## 2020-02-02 DIAGNOSIS — O99513 Diseases of the respiratory system complicating pregnancy, third trimester: Secondary | ICD-10-CM | POA: Insufficient documentation

## 2020-02-02 DIAGNOSIS — F329 Major depressive disorder, single episode, unspecified: Secondary | ICD-10-CM | POA: Insufficient documentation

## 2020-02-02 DIAGNOSIS — O368130 Decreased fetal movements, third trimester, not applicable or unspecified: Secondary | ICD-10-CM | POA: Insufficient documentation

## 2020-02-02 DIAGNOSIS — Z803 Family history of malignant neoplasm of breast: Secondary | ICD-10-CM | POA: Diagnosis not present

## 2020-02-02 DIAGNOSIS — J45909 Unspecified asthma, uncomplicated: Secondary | ICD-10-CM | POA: Diagnosis not present

## 2020-02-02 DIAGNOSIS — Z79899 Other long term (current) drug therapy: Secondary | ICD-10-CM | POA: Insufficient documentation

## 2020-02-02 DIAGNOSIS — R109 Unspecified abdominal pain: Secondary | ICD-10-CM | POA: Insufficient documentation

## 2020-02-02 DIAGNOSIS — O479 False labor, unspecified: Secondary | ICD-10-CM

## 2020-02-02 DIAGNOSIS — Z3A39 39 weeks gestation of pregnancy: Secondary | ICD-10-CM

## 2020-02-02 DIAGNOSIS — O26893 Other specified pregnancy related conditions, third trimester: Secondary | ICD-10-CM | POA: Insufficient documentation

## 2020-02-02 DIAGNOSIS — Z853 Personal history of malignant neoplasm of breast: Secondary | ICD-10-CM | POA: Insufficient documentation

## 2020-02-02 DIAGNOSIS — O99343 Other mental disorders complicating pregnancy, third trimester: Secondary | ICD-10-CM | POA: Diagnosis not present

## 2020-02-02 DIAGNOSIS — O471 False labor at or after 37 completed weeks of gestation: Secondary | ICD-10-CM | POA: Insufficient documentation

## 2020-02-02 DIAGNOSIS — Z87891 Personal history of nicotine dependence: Secondary | ICD-10-CM | POA: Diagnosis not present

## 2020-02-02 NOTE — MAU Provider Note (Signed)
Chief Complaint:  Contractions and Decreased Fetal Movement   First Provider Initiated Contact with Patient 02/02/20 0230     HPI: Michaela Williams is a 24 y.o. G2P1001 at 39w1dwho presents to maternity admissions reporting decreased fetal movement and painful contractions. . She denies LOF, vaginal bleeding, vaginal itching/burning, urinary symptoms, h/a, dizziness, n/v, diarrhea, constipation or fever/chills.    Abdominal Pain The current episode started today. The onset quality is gradual. The problem occurs intermittently. The problem has been unchanged. The quality of the pain is cramping. Pertinent negatives include no constipation, diarrhea, dysuria, fever, frequency or myalgias. Nothing aggravates the pain. The pain is relieved by nothing. She has tried nothing for the symptoms.    RN Note: Ctxs all day that are stronger. Small amt bloody show. Baby has not moved much today. Denies LOF. 5cm last sve.   Past Medical History: Past Medical History:  Diagnosis Date  . Anemia   . Asthma   . breast ca dx'd 05/2017   left  . Depression    no meds in 3 yrs  . Headache   . Infection    UTI  . Seizures (HCC) 2014   "seizure like activity", reaction to medicaiton, none since    Past obstetric history: OB History  Gravida Para Term Preterm AB Living  2 1 1 0 0 1  SAB TAB Ectopic Multiple Live Births  0 0 0 0 1    # Outcome Date GA Lbr Len/2nd Weight Sex Delivery Anes PTL Lv  2 Current           1 Term 02/04/17 [redacted]w[redacted]d  3232 g M Vag-Spont  N LIV    Past Surgical History: Past Surgical History:  Procedure Laterality Date  . ESOPHAGOGASTRODUODENOSCOPY (EGD) WITH PROPOFOL N/A 06/03/2017   Procedure: ESOPHAGOGASTRODUODENOSCOPY (EGD) WITH PROPOFOL;  Surgeon: Stark, Malcolm T, MD;  Location: [facility];  Service: Endoscopy;  Laterality: N/A;    Family History: Family History  Adopted: Yes  Problem Relation Age of Onset  . Mental illness Mother   . Cancer Mother   . Breast  cancer Mother   . Cancer Maternal Grandmother   . Hypertension Maternal Grandmother   . Diabetes Maternal Grandmother   . Cancer Maternal Aunt   . Breast cancer Maternal Aunt   . Breast cancer Sister   . Breast cancer Maternal Aunt   . Breast cancer Maternal Aunt   . Breast cancer Maternal Aunt   . Breast cancer Maternal Aunt     Social History: Social History   Tobacco Use  . Smoking status: Former Smoker    Types: Cigarettes    Quit date: 12/19/2014    Years since quitting: 5.1  . Smokeless tobacco: Never Used  . Tobacco comment: age 18  Vaping Use  . Vaping Use: Former  Substance Use Topics  . Alcohol use: Not Currently    Comment: occasionally  . Drug use: No    Allergies:  Allergies  Allergen Reactions  . Flexeril [Cyclobenzaprine]     Patient had a near syncopal event following Flexeril PO in MAU 11/21/2019  . Lexapro [Escitalopram] Other (See Comments)    Didn't like how it made her feel  . Lithium Other (See Comments)    SEIZURES, Childhood reaction    Meds:  Medications Prior to Admission  Medication Sig Dispense Refill Last Dose  . Blood Pressure Monitoring (BLOOD PRESSURE KIT) DEVI 1 kit by Does not apply route once a week. Check Blood Pressure   regularly and record readings into the [employer] App.  Large Cuff.  DX O90.0 1 each 0   . Elastic Bandages & Supports (COMFORT FIT MATERNITY SUPP SM) MISC Wear as directed. 1 each 0   . ferrous sulfate 325 (65 FE) MG tablet Take 1 tablet (325 mg total) by mouth daily. 30 tablet 3   . Prenatal Vit-Fe Fumarate-FA (PRENATAL PO) Take by mouth.     . traZODone (DESYREL) 50 MG tablet Take 0.5 tablets (25 mg total) by mouth at bedtime as needed for sleep. 30 tablet 3     I have reviewed patient's Past Medical Hx, Surgical Hx, Family Hx, Social Hx, medications and allergies.   ROS:  Review of Systems  Constitutional: Negative for fever.  Gastrointestinal: Positive for abdominal pain. Negative for constipation and  diarrhea.  Genitourinary: Negative for dysuria and frequency.  Musculoskeletal: Negative for myalgias.   Other systems negative  Physical Exam   Patient Vitals for the past 24 hrs:  BP Temp Pulse Resp Height Weight  02/02/20 0155 111/72 98.4 F (36.9 C) 101 20 5' 1" (1.549 m) 73.9 kg   Constitutional: Well-developed, well-nourished female in no acute distress.  Cardiovascular: normal rate and rhythm Respiratory: normal effort, clear to auscultation bilaterally GI: Abd soft, non-tender, gravid appropriate for gestational age.   No rebound or guarding. MS: Extremities nontender, no edema, normal ROM Neurologic: Alert and oriented x 4.  GU: Neg CVAT.  PELVIC EXAM: Dilation: 4 Effacement (%): Thick Cervical Position: Posterior Station: -3 Presentation: Vertex Exam by::  , CNM  FHT:  Baseline 140 , moderate variability, accelerations present, no decelerations Contractions: q 7-9 mins Irregular     Labs: No results found for this or any previous visit (from the past 24 hour(s)). A/Positive/-- (01/29 1108)  Imaging:    MAU Course/MDM: NST reviewed and is very reactive.  Treatments in MAU included EFM Cervix had not changed much from office exam  Offered recheck after monitoring and patient declines.  Is scheduled for IOL next week .    Assessment: Single IUP at [redacted]w[redacted]d Decreased fetal movement, now feels movement Irregular uterine contractions  Plan: Discharge home Labor precautions and fetal kick counts Follow up in Office for prenatal visits   Encouraged to return here or to other Urgent Care/ED if she develops worsening of symptoms, increase in pain, fever, or other concerning symptoms.   Pt stable at time of discharge.    CNM, MSN Certified Nurse-Midwife 02/02/2020 2:31 AM 

## 2020-02-02 NOTE — MAU Note (Signed)
Ctxs all day that are stronger. Small amt bloody show. Baby has not moved much today. Denies LOF. 5cm last sve.

## 2020-02-02 NOTE — Progress Notes (Signed)
Hansel Feinstein CNM in to discuss d/c plan with pt. Written and verbal d/c instructions given and understanding voiced.

## 2020-02-02 NOTE — Telephone Encounter (Signed)
Attempt f/u call with pt: "call cannot be completed at this time", so unable to leave voice message.

## 2020-02-02 NOTE — Discharge Instructions (Signed)
Labor and Vaginal Delivery  Vaginal delivery means that you give birth by pushing your baby out of your birth canal (vagina). A team of health care providers will help you before, during, and after vaginal delivery. Birth experiences are unique for every woman and every pregnancy, and birth experiences vary depending on where you choose to give birth. What happens when I arrive at the birth center or hospital? Once you are in labor and have been admitted into the hospital or birth center, your health care provider may:  Review your pregnancy history and any concerns that you have.  Insert an IV into one of your veins. This may be used to give you fluids and medicines.  Check your blood pressure, pulse, temperature, and heart rate (vital signs).  Check whether your bag of water (amniotic sac) has broken (ruptured).  Talk with you about your birth plan and discuss pain control options. Monitoring Your health care provider may monitor your contractions (uterine monitoring) and your baby's heart rate (fetal monitoring). You may need to be monitored:  Often, but not continuously (intermittently).  All the time or for long periods at a time (continuously). Continuous monitoring may be needed if: ? You are taking certain medicines, such as medicine to relieve pain or make your contractions stronger. ? You have pregnancy or labor complications. Monitoring may be done by:  Placing a special stethoscope or a handheld monitoring device on your abdomen to check your baby's heartbeat and to check for contractions.  Placing monitors on your abdomen (external monitors) to record your baby's heartbeat and the frequency and length of contractions.  Placing monitors inside your uterus through your vagina (internal monitors) to record your baby's heartbeat and the frequency, length, and strength of your contractions. Depending on the type of monitor, it may remain in your uterus or on your baby's head  until birth.  Telemetry. This is a type of continuous monitoring that can be done with external or internal monitors. Instead of having to stay in bed, you are able to move around during telemetry. Physical exam Your health care provider may perform frequent physical exams. This may include:  Checking how and where your baby is positioned in your uterus.  Checking your cervix to determine: ? Whether it is thinning out (effacing). ? Whether it is opening up (dilating). What happens during labor and delivery?  Normal labor and delivery is divided into the following three stages: Stage 1  This is the longest stage of labor.  This stage can last for hours or days.  Throughout this stage, you will feel contractions. Contractions generally feel mild, infrequent, and irregular at first. They get stronger, more frequent (about every 2-3 minutes), and more regular as you move through this stage.  This stage ends when your cervix is completely dilated to 4 inches (10 cm) and completely effaced. Stage 2  This stage starts once your cervix is completely effaced and dilated and lasts until the delivery of your baby.  This stage may last from 20 minutes to 2 hours.  This is the stage where you will feel an urge to push your baby out of your vagina.  You may feel stretching and burning pain, especially when the widest part of your baby's head passes through the vaginal opening (crowning).  Once your baby is delivered, the umbilical cord will be clamped and cut. This usually occurs after waiting a period of 1-2 minutes after delivery.  Your baby will be placed on your   bare chest (skin-to-skin contact) in an upright position and covered with a warm blanket. Watch your baby for feeding cues, like rooting or sucking, and help the baby to your breast for his or her first feeding. Stage 3  This stage starts immediately after the birth of your baby and ends after you deliver the placenta.  This  stage may take anywhere from 5 to 30 minutes.  After your baby has been delivered, you will feel contractions as your body expels the placenta and your uterus contracts to control bleeding. What can I expect after labor and delivery?  After labor is over, you and your baby will be monitored closely until you are ready to go home to ensure that you are both healthy. Your health care team will teach you how to care for yourself and your baby.  You and your baby will stay in the same room (rooming in) during your hospital stay. This will encourage early bonding and successful breastfeeding.  You may continue to receive fluids and medicines through an IV.  Your uterus will be checked and massaged regularly (fundal massage).  You will have some soreness and pain in your abdomen, vagina, and the area of skin between your vaginal opening and your anus (perineum).  If an incision was made near your vagina (episiotomy) or if you had some vaginal tearing during delivery, cold compresses may be placed on your episiotomy or your tear. This helps to reduce pain and swelling.  You may be given a squirt bottle to use instead of wiping when you go to the bathroom. To use the squirt bottle, follow these steps: ? Before you urinate, fill the squirt bottle with warm water. Do not use hot water. ? After you urinate, while you are sitting on the toilet, use the squirt bottle to rinse the area around your urethra and vaginal opening. This rinses away any urine and blood. ? Fill the squirt bottle with clean water every time you use the bathroom.  It is normal to have vaginal bleeding after delivery. Wear a sanitary pad for vaginal bleeding and discharge. Summary  Vaginal delivery means that you will give birth by pushing your baby out of your birth canal (vagina).  Your health care provider may monitor your contractions (uterine monitoring) and your baby's heart rate (fetal monitoring).  Your health care  provider may perform a physical exam.  Normal labor and delivery is divided into three stages.  After labor is over, you and your baby will be monitored closely until you are ready to go home. This information is not intended to replace advice given to you by your health care provider. Make sure you discuss any questions you have with your health care provider. Document Revised: 07/28/2017 Document Reviewed: 07/28/2017 Elsevier Patient Education  2020 [employer]  

## 2020-02-03 ENCOUNTER — Ambulatory Visit: Payer: 59 | Attending: Women's Health

## 2020-02-03 ENCOUNTER — Other Ambulatory Visit: Payer: Self-pay | Admitting: Advanced Practice Midwife

## 2020-02-03 ENCOUNTER — Encounter: Payer: Self-pay | Admitting: *Deleted

## 2020-02-03 ENCOUNTER — Other Ambulatory Visit: Payer: Self-pay

## 2020-02-03 ENCOUNTER — Ambulatory Visit: Payer: 59 | Admitting: *Deleted

## 2020-02-03 VITALS — BP 105/68 | HR 102

## 2020-02-03 DIAGNOSIS — O26843 Uterine size-date discrepancy, third trimester: Secondary | ICD-10-CM

## 2020-02-03 DIAGNOSIS — O26893 Other specified pregnancy related conditions, third trimester: Secondary | ICD-10-CM | POA: Diagnosis not present

## 2020-02-03 DIAGNOSIS — O4693 Antepartum hemorrhage, unspecified, third trimester: Secondary | ICD-10-CM | POA: Diagnosis not present

## 2020-02-03 DIAGNOSIS — O322XX9 Maternal care for transverse and oblique lie, other fetus: Secondary | ICD-10-CM | POA: Diagnosis not present

## 2020-02-03 DIAGNOSIS — Z3A39 39 weeks gestation of pregnancy: Secondary | ICD-10-CM

## 2020-02-03 DIAGNOSIS — Z362 Encounter for other antenatal screening follow-up: Secondary | ICD-10-CM | POA: Diagnosis not present

## 2020-02-06 ENCOUNTER — Other Ambulatory Visit (HOSPITAL_COMMUNITY)
Admission: RE | Admit: 2020-02-06 | Discharge: 2020-02-06 | Disposition: A | Discharge status: Home / Self Care | Payer: 59 | Source: Ambulatory Visit | Attending: Family Medicine | Admitting: Family Medicine

## 2020-02-06 DIAGNOSIS — Z20822 Contact with and (suspected) exposure to covid-19: Secondary | ICD-10-CM | POA: Insufficient documentation

## 2020-02-06 DIAGNOSIS — Z01812 Encounter for preprocedural laboratory examination: Secondary | ICD-10-CM | POA: Insufficient documentation

## 2020-02-06 LAB — SARS CORONAVIRUS 2 (TAT 6-24 HRS): SARS Coronavirus 2: NEGATIVE

## 2020-02-07 ENCOUNTER — Encounter: Payer: Self-pay | Admitting: Obstetrics

## 2020-02-07 ENCOUNTER — Other Ambulatory Visit: Payer: Self-pay

## 2020-02-07 ENCOUNTER — Ambulatory Visit (INDEPENDENT_AMBULATORY_CARE_PROVIDER_SITE_OTHER): Payer: 59 | Admitting: Obstetrics

## 2020-02-07 ENCOUNTER — Other Ambulatory Visit: Payer: Self-pay | Admitting: Family Medicine

## 2020-02-07 VITALS — BP 120/81 | HR 106 | Wt 165.3 lb

## 2020-02-07 DIAGNOSIS — Z3480 Encounter for supervision of other normal pregnancy, unspecified trimester: Secondary | ICD-10-CM

## 2020-02-07 NOTE — Progress Notes (Signed)
Patient presents for ROB. Patient wants to start back on depression medications right after delivery. She plans to breastfeed and would like to start on a medication that is safe while nursing.
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
## 2020-02-08 ENCOUNTER — Inpatient Hospital Stay (HOSPITAL_COMMUNITY): Payer: 59

## 2020-02-08 ENCOUNTER — Inpatient Hospital Stay (HOSPITAL_COMMUNITY): Payer: 59 | Admitting: Anesthesiology

## 2020-02-08 ENCOUNTER — Inpatient Hospital Stay (HOSPITAL_COMMUNITY)
Admission: AD | Admit: 2020-02-08 | Discharge: 2020-02-11 | DRG: 806 | Disposition: A | Discharge status: Home / Self Care | Payer: 59 | Attending: Obstetrics and Gynecology | Admitting: Obstetrics and Gynecology

## 2020-02-08 ENCOUNTER — Encounter (HOSPITAL_COMMUNITY): Payer: Self-pay | Admitting: Obstetrics and Gynecology

## 2020-02-08 DIAGNOSIS — Z30430 Encounter for insertion of intrauterine contraceptive device: Secondary | ICD-10-CM

## 2020-02-08 DIAGNOSIS — Z20822 Contact with and (suspected) exposure to covid-19: Secondary | ICD-10-CM | POA: Diagnosis present

## 2020-02-08 DIAGNOSIS — O480 Post-term pregnancy: Principal | ICD-10-CM | POA: Diagnosis present

## 2020-02-08 DIAGNOSIS — Z87891 Personal history of nicotine dependence: Secondary | ICD-10-CM | POA: Diagnosis not present

## 2020-02-08 DIAGNOSIS — O99344 Other mental disorders complicating childbirth: Secondary | ICD-10-CM | POA: Diagnosis present

## 2020-02-08 DIAGNOSIS — O99893 Other specified diseases and conditions complicating puerperium: Secondary | ICD-10-CM | POA: Diagnosis present

## 2020-02-08 DIAGNOSIS — Z9189 Other specified personal risk factors, not elsewhere classified: Secondary | ICD-10-CM

## 2020-02-08 DIAGNOSIS — Z8659 Personal history of other mental and behavioral disorders: Secondary | ICD-10-CM

## 2020-02-08 DIAGNOSIS — J45909 Unspecified asthma, uncomplicated: Secondary | ICD-10-CM | POA: Diagnosis present

## 2020-02-08 DIAGNOSIS — F314 Bipolar disorder, current episode depressed, severe, without psychotic features: Secondary | ICD-10-CM | POA: Diagnosis present

## 2020-02-08 DIAGNOSIS — Z62810 Personal history of physical and sexual abuse in childhood: Secondary | ICD-10-CM

## 2020-02-08 DIAGNOSIS — I952 Hypotension due to drugs: Secondary | ICD-10-CM | POA: Diagnosis not present

## 2020-02-08 DIAGNOSIS — F4321 Adjustment disorder with depressed mood: Secondary | ICD-10-CM

## 2020-02-08 DIAGNOSIS — R262 Difficulty in walking, not elsewhere classified: Secondary | ICD-10-CM | POA: Diagnosis present

## 2020-02-08 DIAGNOSIS — O99345 Other mental disorders complicating the puerperium: Secondary | ICD-10-CM | POA: Diagnosis present

## 2020-02-08 DIAGNOSIS — Z30014 Encounter for initial prescription of intrauterine contraceptive device: Secondary | ICD-10-CM | POA: Diagnosis not present

## 2020-02-08 DIAGNOSIS — F3181 Bipolar II disorder: Secondary | ICD-10-CM | POA: Diagnosis present

## 2020-02-08 DIAGNOSIS — M545 Low back pain: Secondary | ICD-10-CM | POA: Diagnosis present

## 2020-02-08 DIAGNOSIS — Z331 Pregnant state, incidental: Secondary | ICD-10-CM

## 2020-02-08 DIAGNOSIS — Z3A40 40 weeks gestation of pregnancy: Secondary | ICD-10-CM

## 2020-02-08 DIAGNOSIS — T413X5A Adverse effect of local anesthetics, initial encounter: Secondary | ICD-10-CM | POA: Diagnosis not present

## 2020-02-08 DIAGNOSIS — O9A22 Injury, poisoning and certain other consequences of external causes complicating childbirth: Secondary | ICD-10-CM | POA: Diagnosis not present

## 2020-02-08 LAB — TYPE AND SCREEN
ABO/RH(D): A POS
Antibody Screen: NEGATIVE

## 2020-02-08 LAB — RPR: RPR Ser Ql: NONREACTIVE

## 2020-02-08 LAB — CBC
HCT: 33.8 % — ABNORMAL LOW (ref 36.0–46.0)
Hemoglobin: 10.5 g/dL — ABNORMAL LOW (ref 12.0–15.0)
MCH: 27.2 pg (ref 26.0–34.0)
MCHC: 31.1 g/dL (ref 30.0–36.0)
MCV: 87.6 fL (ref 80.0–100.0)
Platelets: 269 10*3/uL (ref 150–400)
RBC: 3.86 MIL/uL — ABNORMAL LOW (ref 3.87–5.11)
RDW: 14.8 % (ref 11.5–15.5)
WBC: 11.3 10*3/uL — ABNORMAL HIGH (ref 4.0–10.5)
nRBC: 0 % (ref 0.0–0.2)

## 2020-02-08 MED ORDER — PRENATAL MULTIVITAMIN CH
1.0000 | ORAL_TABLET | Freq: Every day | ORAL | Status: DC
  Administered 2020-02-08 – 2020-02-11 (×4): 1 via ORAL
  Filled 2020-02-08 (×4): qty 1

## 2020-02-08 MED ORDER — OXYTOCIN-SODIUM CHLORIDE 30-0.9 UT/500ML-% IV SOLN: 2.5000 [IU]/h | INTRAVENOUS | Status: DC

## 2020-02-08 MED ORDER — EPHEDRINE 5 MG/ML INJ
10.0000 mg | INTRAVENOUS | Status: DC | PRN
  Filled 2020-02-08: qty 10

## 2020-02-08 MED ORDER — SODIUM CHLORIDE (PF) 0.9 % IJ SOLN
INTRAMUSCULAR | Status: DC | PRN
  Administered 2020-02-08: 12 mL/h via EPIDURAL

## 2020-02-08 MED ORDER — PHENYLEPHRINE 40 MCG/ML (10ML) SYRINGE FOR IV PUSH (FOR BLOOD PRESSURE SUPPORT)
80.0000 ug | PREFILLED_SYRINGE | INTRAVENOUS | Status: DC | PRN
  Filled 2020-02-08: qty 10

## 2020-02-08 MED ORDER — OXYTOCIN-SODIUM CHLORIDE 30-0.9 UT/500ML-% IV SOLN
1.0000 m[IU]/min | INTRAVENOUS | Status: DC
  Administered 2020-02-08: 2 m[IU]/min via INTRAVENOUS
  Filled 2020-02-08: qty 500

## 2020-02-08 MED ORDER — WITCH HAZEL-GLYCERIN EX PADS: 1.0000 "application " | MEDICATED_PAD | CUTANEOUS | Status: DC | PRN

## 2020-02-08 MED ORDER — TERBUTALINE SULFATE 1 MG/ML IJ SOLN: 0.2500 mg | Freq: Once | INTRAMUSCULAR | Status: DC | PRN

## 2020-02-08 MED ORDER — GADOBUTROL 1 MMOL/ML IV SOLN
7.5000 mL | Freq: Once | INTRAVENOUS | Status: AC | PRN
  Administered 2020-02-08: 7.5 mL via INTRAVENOUS

## 2020-02-08 MED ORDER — HYDROMORPHONE HCL 1 MG/ML IJ SOLN
INTRAMUSCULAR | Status: AC
  Administered 2020-02-08: 1 mg via INTRAVENOUS
  Filled 2020-02-08: qty 1

## 2020-02-08 MED ORDER — LIDOCAINE HCL (PF) 1 % IJ SOLN: 30.0000 mL | INTRAMUSCULAR | Status: DC | PRN

## 2020-02-08 MED ORDER — LACTATED RINGERS IV SOLN: 500.0000 mL | INTRAVENOUS | Status: DC | PRN

## 2020-02-08 MED ORDER — DIPHENHYDRAMINE HCL 50 MG/ML IJ SOLN: 12.5000 mg | INTRAMUSCULAR | Status: DC | PRN

## 2020-02-08 MED ORDER — BUPIVACAINE HCL (PF) 0.25 % IJ SOLN
INTRAMUSCULAR | Status: DC | PRN
  Administered 2020-02-08: 8 mL via EPIDURAL

## 2020-02-08 MED ORDER — PHENYLEPHRINE 40 MCG/ML (10ML) SYRINGE FOR IV PUSH (FOR BLOOD PRESSURE SUPPORT)
80.0000 ug | PREFILLED_SYRINGE | INTRAVENOUS | Status: DC | PRN
  Administered 2020-02-08: 80 ug via INTRAVENOUS

## 2020-02-08 MED ORDER — LACTATED RINGERS IV SOLN: INTRAVENOUS | Status: DC

## 2020-02-08 MED ORDER — BENZOCAINE-MENTHOL 20-0.5 % EX AERO
1.0000 "application " | INHALATION_SPRAY | CUTANEOUS | Status: DC | PRN
  Administered 2020-02-08: 1 via TOPICAL
  Filled 2020-02-08: qty 56

## 2020-02-08 MED ORDER — ACETAMINOPHEN 325 MG PO TABS: 650.0000 mg | ORAL_TABLET | ORAL | Status: DC | PRN

## 2020-02-08 MED ORDER — FENTANYL-BUPIVACAINE-NACL 0.5-0.125-0.9 MG/250ML-% EP SOLN
12.0000 mL/h | EPIDURAL | Status: DC | PRN
  Filled 2020-02-08: qty 250

## 2020-02-08 MED ORDER — SENNOSIDES-DOCUSATE SODIUM 8.6-50 MG PO TABS
2.0000 | ORAL_TABLET | ORAL | Status: DC
  Administered 2020-02-08 – 2020-02-11 (×3): 2 via ORAL
  Filled 2020-02-08 (×3): qty 2

## 2020-02-08 MED ORDER — SOD CITRATE-CITRIC ACID 500-334 MG/5ML PO SOLN: 30.0000 mL | ORAL | Status: DC | PRN

## 2020-02-08 MED ORDER — ONDANSETRON HCL 4 MG PO TABS
4.0000 mg | ORAL_TABLET | ORAL | Status: DC | PRN
  Administered 2020-02-11: 4 mg via ORAL
  Filled 2020-02-08: qty 1

## 2020-02-08 MED ORDER — IBUPROFEN 600 MG PO TABS
600.0000 mg | ORAL_TABLET | Freq: Four times a day (QID) | ORAL | Status: DC
  Administered 2020-02-08 – 2020-02-11 (×13): 600 mg via ORAL
  Filled 2020-02-08 (×14): qty 1

## 2020-02-08 MED ORDER — ONDANSETRON HCL 4 MG/2ML IJ SOLN
4.0000 mg | Freq: Four times a day (QID) | INTRAMUSCULAR | Status: DC | PRN
  Administered 2020-02-08: 4 mg via INTRAVENOUS
  Filled 2020-02-08: qty 2

## 2020-02-08 MED ORDER — EPHEDRINE 5 MG/ML INJ
10.0000 mg | INTRAVENOUS | Status: DC | PRN
  Administered 2020-02-08: 20 mg via INTRAVENOUS

## 2020-02-08 MED ORDER — LACTATED RINGERS IV SOLN: 500.0000 mL | Freq: Once | INTRAVENOUS | Status: DC

## 2020-02-08 MED ORDER — ONDANSETRON HCL 4 MG/2ML IJ SOLN
4.0000 mg | INTRAMUSCULAR | Status: DC | PRN
  Administered 2020-02-08 – 2020-02-11 (×6): 4 mg via INTRAVENOUS
  Filled 2020-02-08 (×6): qty 2

## 2020-02-08 MED ORDER — DIBUCAINE (PERIANAL) 1 % EX OINT: 1.0000 "application " | TOPICAL_OINTMENT | CUTANEOUS | Status: DC | PRN

## 2020-02-08 MED ORDER — HYDROMORPHONE HCL 1 MG/ML IJ SOLN
1.0000 mg | INTRAMUSCULAR | Status: DC | PRN
  Administered 2020-02-08 – 2020-02-11 (×9): 1 mg via INTRAVENOUS
  Filled 2020-02-08 (×9): qty 1

## 2020-02-08 MED ORDER — LEVONORGESTREL 19.5 MCG/DAY IU IUD
INTRAUTERINE_SYSTEM | Freq: Once | INTRAUTERINE | Status: AC
  Administered 2020-02-08: 1 via INTRAUTERINE

## 2020-02-08 MED ORDER — OXYCODONE-ACETAMINOPHEN 5-325 MG PO TABS: 2.0000 | ORAL_TABLET | ORAL | Status: DC | PRN

## 2020-02-08 MED ORDER — COCONUT OIL OIL
1.0000 "application " | TOPICAL_OIL | Status: DC | PRN
  Administered 2020-02-08: 1 via TOPICAL

## 2020-02-08 MED ORDER — FENTANYL CITRATE (PF) 100 MCG/2ML IJ SOLN
50.0000 ug | INTRAMUSCULAR | Status: DC | PRN
  Administered 2020-02-08: 100 ug via INTRAVENOUS
  Filled 2020-02-08 (×2): qty 2

## 2020-02-08 MED ORDER — SIMETHICONE 80 MG PO CHEW: 80.0000 mg | CHEWABLE_TABLET | ORAL | Status: DC | PRN

## 2020-02-08 MED ORDER — LEVONORGESTREL 19.5 MCG/DAY IU IUD
INTRAUTERINE_SYSTEM | INTRAUTERINE | Status: AC
  Filled 2020-02-08: qty 1

## 2020-02-08 MED ORDER — TETANUS-DIPHTH-ACELL PERTUSSIS 5-2.5-18.5 LF-MCG/0.5 IM SUSP: 0.5000 mL | Freq: Once | INTRAMUSCULAR | Status: DC

## 2020-02-08 MED ORDER — OXYTOCIN BOLUS FROM INFUSION
333.0000 mL | Freq: Once | INTRAVENOUS | Status: AC
  Administered 2020-02-08: 333 mL via INTRAVENOUS

## 2020-02-08 MED ORDER — HYDROMORPHONE HCL 1 MG/ML IJ SOLN: 1.0000 mg | Freq: Once | INTRAMUSCULAR | Status: AC

## 2020-02-08 MED ORDER — DIPHENHYDRAMINE HCL 25 MG PO CAPS: 25.0000 mg | ORAL_CAPSULE | Freq: Four times a day (QID) | ORAL | Status: DC | PRN

## 2020-02-08 MED ORDER — OXYCODONE-ACETAMINOPHEN 5-325 MG PO TABS
1.0000 | ORAL_TABLET | ORAL | Status: DC | PRN
  Administered 2020-02-08: 1 via ORAL
  Filled 2020-02-08: qty 1

## 2020-02-08 MED ORDER — LIDOCAINE HCL (PF) 1 % IJ SOLN
INTRAMUSCULAR | Status: DC | PRN
  Administered 2020-02-08: 5 mL via EPIDURAL

## 2020-02-08 MED ORDER — FENTANYL CITRATE (PF) 100 MCG/2ML IJ SOLN
INTRAMUSCULAR | Status: DC | PRN
  Administered 2020-02-08: 100 ug via EPIDURAL

## 2020-02-08 MED ORDER — ACETAMINOPHEN 325 MG PO TABS
650.0000 mg | ORAL_TABLET | ORAL | Status: DC | PRN
  Administered 2020-02-08 – 2020-02-09 (×2): 650 mg via ORAL
  Filled 2020-02-08 (×2): qty 2
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
## 2020-02-08 NOTE — Progress Notes (Signed)
Michaela Williams is a 24 y.o. G2P1001 at [redacted]w[redacted]d admitted for induction of labor due to post-dates.  Subjective: Called to room when fetal heart rate noted to be decreased and patient had symptomatic hypotension with subsequent fetal bradycardia. Nursing staff stated the patient had just been re-dosed for her epidural and they were calling anesthesia back in to assist with treatment.  Objective: BP 123/72   Pulse 68   Temp 98.3 F (36.8 C) (Oral)   Resp 20   Ht 5\' 1"  (1.549 m)   Wt 74.7 kg   LMP 05/04/2019 (Exact Date)   BMI 31.12 kg/m  No intake/output data recorded.  FHT:  FHR: 135 bpm, variability: moderate,  accelerations:  Abscent,  decelerations:  Present mild variable UC:   Regular every 2-4 minutes  SVE:   Dilation: 9 Effacement (%): 90 Station: 0 Exam by:: Ola Spurr, RN  Pitocin @ 6 mu/min  Labs: Lab Results  Component Value Date   WBC 11.3 (H) 02/08/2020   HGB 10.5 (L) 02/08/2020   HCT 33.8 (L) 02/08/2020   MCV 87.6 02/08/2020   PLT 269 02/08/2020    Assessment / Plan: Michaela Williams is a 24 y.o. G2P1001 at [redacted]w[redacted]d here for IOL for post-dates.  #Labor: Active labor progressing rapidly. Notable fetal heart rate deceleration secondary to maternal hypotension related to epidural bolus. Fetal bradycardia and maternal hypotension resolved with Phenylephrine 23mcg and ephedrine 20mg . Pitocin at 87mu/min. AROM PRN. Anticipating vaginal delivery. #Pain: Epidural #FWB: Cat II, reassuring for variability  #GBS/ID: Negative #COVID: Negative #MOF: Breast and bottle #MOC: PP IUD #Anemia in Pregnancy: 11.0>10.5   [place] DO PGY1, Family Medicine Resident 02/08/2020, 5:30 AM

## 2020-02-08 NOTE — Procedures (Signed)
  Post-Placental IUD Insertion Procedure Note  Patient identified, informed consent signed prior to delivery, signed copy in chart, time out was performed.    Vaginal, labial and perineal areas thoroughly inspected for lacerations. No lacerations identified - Liletta IUD inserted.  - IUD inserted with inserter per manufacturer's instructions.    Strings trimmed to the level of the introitus. Patient tolerated procedure well.  Patient given post procedure instructions and IUD care card with expiration date.  Patient is asked to keep IUD strings tucked in her vagina until her postpartum follow up visit in 4-6 weeks. Patient advised to abstain from sexual intercourse and pulling on strings before her follow-up visit. Patient verbalized an understanding of the plan of care and agrees.   Arrie Senate, MD OB Fellow, [facility] 02/08/2020 8:58 AM

## 2020-02-08 NOTE — Discharge Instructions (Signed)

## 2020-02-08 NOTE — Anesthesia Preprocedure Evaluation (Signed)
Anesthesia Evaluation  Patient identified by MRN, date of birth, ID band Patient awake    Reviewed: Allergy & Precautions, NPO status , Patient's Chart, lab work & pertinent test results  Airway Mallampati: II  TM Distance: >3 FB Neck ROM: Full    Dental no notable dental hx. (+) Teeth Intact, Dental Advisory Given   Pulmonary asthma , former smoker,    Pulmonary exam normal breath sounds clear to auscultation       Cardiovascular Exercise Tolerance: Good negative cardio ROS Normal cardiovascular exam Rhythm:Regular Rate:Normal     Neuro/Psych  Headaches, Bipolar Disorder    GI/Hepatic negative GI ROS, Neg liver ROS,   Endo/Other  negative endocrine ROS  Renal/GU negative Renal ROS     Musculoskeletal negative musculoskeletal ROS (+)   Abdominal   Peds  Hematology negative hematology ROS (+) Lab Results      Component                Value               Date                      WBC                      11.3 (H)            02/08/2020                HGB                      10.5 (L)            02/08/2020                HCT                      33.8 (L)            02/08/2020                MCV                      87.6                02/08/2020                PLT                      269                 02/08/2020              Anesthesia Other Findings   Reproductive/Obstetrics (+) Pregnancy                             Anesthesia Physical Anesthesia Plan  ASA: II  Anesthesia Plan: Epidural   Post-op Pain Management:    Induction:   PONV Risk Score and Plan:   Airway Management Planned:   Additional Equipment:   Intra-op Plan:   Post-operative Plan:   Informed Consent: I have reviewed the patients History and Physical, chart, labs and discussed the procedure including the risks, benefits and alternatives for the proposed anesthesia with the patient or authorized  representative who has indicated his/her understanding and acceptance.       Plan Discussed with:   Anesthesia  Plan Comments: (40 wk G2P1 for LEA)        Anesthesia Quick Evaluation

## 2020-02-08 NOTE — Progress Notes (Signed)
Called to bedside by RN  S: Patient in exquisite pain since delivery, can barely move. Reports unable to move legs without pain radiating from low back into her neck and left shoulder.  O: Paraspinal muscles normotonic. Unable to extend neck, barely able to roll to right side without extreme duress.  A/P: 1 mg Dilaudid given with some improvement in pain Discussed with Dr. Jillyn Hidden, rec MRI cervical/thoracic/lumbar spine to r/o subdermal hematoma as she is s/p epidural Dr. Kennon Rounds made aware  Merilyn Baba, DO OB Fellow, [facility] 02/08/2020 1:51 PM
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
## 2020-02-08 NOTE — Anesthesia Procedure Notes (Signed)
Epidural Patient location during procedure: OB Start time: 02/08/2020 3:49 AM End time: 02/08/2020 4:06 AM  Staffing Anesthesiologist: Barnet Glasgow, MD Performed: anesthesiologist   Preanesthetic Checklist Completed: patient identified, IV checked, site marked, risks and benefits discussed, surgical consent, monitors and equipment checked, pre-op evaluation and timeout performed  Epidural Patient position: sitting Prep: DuraPrep and site prepped and draped Patient monitoring: continuous pulse ox and blood pressure Approach: midline Location: L3-L4 Injection technique: LOR air  Needle:  Needle type: Tuohy  Needle gauge: 17 G Needle length: 9 cm and 9 Needle insertion depth: 6 cm Catheter type: closed end flexible Catheter size: 19 Gauge Catheter at skin depth: 12 cm Test dose: negative  Assessment Events: blood not aspirated, injection not painful, no injection resistance, no paresthesia and negative IV test  Additional Notes Patient identified. Risks/Benefits/Options discussed with patient including but not limited to bleeding, infection, nerve damage, paralysis, failed block, incomplete pain control, headache, blood pressure changes, nausea, vomiting, reactions to medication both or allergic, itching and postpartum back pain. Confirmed with bedside nurse the patient's most recent platelet count. Confirmed with patient that they are not currently taking any anticoagulation, have any bleeding history or any family history of bleeding disorders. Patient expressed understanding and wished to proceed. All questions were answered. Sterile technique was used throughout the entire procedure. Please see nursing notes for vital signs. Test dose was given through epidural needle and negative prior to continuing to dose epidural or start infusion. Warning signs of high block given to the patient including shortness of breath, tingling/numbness in hands, complete motor block, or any concerning  symptoms with instructions to call for help. Patient was given instructions on fall risk and not to get out of bed. All questions and concerns addressed with instructions to call with any issues. 2 Attempt (S) . Patient tolerated procedure well.

## 2020-02-08 NOTE — Progress Notes (Signed)
Attempted to get pt up out of bed to use the restroom but pt began screaming in pain. Pt says she is having severe upper and lower back pain that has steadily been getting worse after delivery. Pt was unable to sit up on the side of the bed and asked to lay back down. Pt in tears at this point. Dr. Jillyn Hidden and Dr. Kennon Rounds made aware. MD to send her fellow to assess patient.

## 2020-02-08 NOTE — Progress Notes (Signed)
Although uneventful lumbar epidural placement unlikely to support thoracic MRI findings, At the behest of Dr Elonda Husky ,  Neurosurgery consulted to follow up MRI findings. Spoke with Dr Charlett Blake. He will look at studies and follow up with faculty practice.    Ishmael Holter. Valma Cava, M.D.
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
## 2020-02-09 ENCOUNTER — Encounter (HOSPITAL_COMMUNITY): Payer: Self-pay | Admitting: Obstetrics and Gynecology

## 2020-02-09 MED ORDER — METAXALONE 800 MG PO TABS
800.0000 mg | ORAL_TABLET | Freq: Three times a day (TID) | ORAL | Status: DC
  Administered 2020-02-09 – 2020-02-11 (×7): 800 mg via ORAL
  Filled 2020-02-09 (×9): qty 1

## 2020-02-09 NOTE — Lactation Note (Addendum)
This note was copied from a baby's chart. Lactation Consultation Note  Patient Name: Michaela Williams IOMBT'D Date: 02/09/2020 Reason for consult: Follow-up assessment;Term  P2 mother whose infant is now 20 hours old.  This is a term baby at 40+0 weeks.  Mother breast fed her first child (now 25 years old) for 6 months.  Mother has gone through breast cancer treatment prior to becoming pregnant.  She believe she may have until the "winter time" before she gets any further treatment.  She will have a couple of appointments with professionals after discharge to discuss treatment plan.  Mother's feeding preference is breast/bottle.  Mother had been breast feeding for approximately 1/2 hour when I arrived.  Baby was fussy and I offered to assist.  Mother's breasts are soft and non tender and nipples are everted and intact.  Mother was in a laid back position due to pain to her head/neck area from labor.    Assisted baby to latch effectively to the right breast.  She immediately began sucking rhythmically and mother denied pain with latching.  She did feel uterine contractions.  Reviewed basic breast feeding concepts with her while baby fed well for an additional 8 minutes.  Discussed how to "break the suction" when removing baby from the breast.  Nipple rounded after feeding and baby fell asleep STS on mother's chest.  Discussed breast feeding only while in the hospital.  Mother agreeable to this.  She mentioned that her son had a very difficulty time taking an artificial nipple during transitioning.  Due to mother needing to build up a good milk supply prior to beginning more cancer treatment, I suggested she pump for 15 minutes after breast feeding.  Mother has pumped once so far.  She is willing to do this and desires to get as much milk as possible prior to treatment.  Mother does not have a DEBP for home use.  She planned on using her manual pump.  Explained that her manual pump will not be effective  in obtaining a good milk supply.  She has Multimedia programmer and [facility].  After further discussion, she will plan on obtaining a DEBP from insurance and accepting the formula package from [facility].  She was appreciative of the help and receptive to learning.  Father present and assisting in the baby's care.  Mother will call as needed for further assistance.   Maternal Data    Feeding Feeding Type: Breast Fed  LATCH Score Latch: Grasps breast easily, tongue down, lips flanged, rhythmical sucking.  Audible Swallowing: A few with stimulation  Type of Nipple: Everted at rest and after stimulation  Comfort (Breast/Nipple): Soft / non-tender  Hold (Positioning): Assistance needed to correctly position infant at breast and maintain latch.  LATCH Score: 8  Interventions Interventions: Breast feeding basics reviewed;Assisted with latch;Skin to skin;Breast massage;Hand express;Breast compression;Adjust position;Position options;Support pillows  Lactation Tools Discussed/Used     Consult Status Consult Status: Follow-up Date: 02/10/20 Follow-up type: In-patient    [name] R Taiyana Kissler 02/09/2020, 8:53 AM

## 2020-02-09 NOTE — Progress Notes (Signed)
Paged [phone] to notify PT of order. Also called the acute inpatient therapy number [phone] and left voicemail. Waiting to hear back.

## 2020-02-09 NOTE — Progress Notes (Signed)
Post Partum Day 1 Subjective: Still having posterior chain pain from occiput to sacrum worse in neck  Objective: Blood pressure (!) 111/58, pulse 86, temperature 98.5 F (36.9 C), temperature source Oral, resp. rate 18, height 5\' 1"  (1.549 m), weight 74.7 kg, last menstrual period 05/04/2019, SpO2 97 %, unknown if currently breastfeeding.  Physical Exam:  General: alert, cooperative and mild distress Lochia: appropriate Uterine Fundus: firm Incision:  DVT Evaluation: No evidence of DVT seen on physical exam.  No focal neurological symptoms or signs  MR CERVICAL SPINE W WO CONTRAST  Result Date: 02/08/2020 CLINICAL DATA:  Acute or progressive myelopathy. Exquisite spinal pain. Recent childbirth with epidural anesthesia. EXAM: MRI CERVICAL SPINE WITHOUT AND WITH CONTRAST TECHNIQUE: Multiplanar and multiecho pulse sequences of the cervical spine, to include the craniocervical junction and cervicothoracic junction, were obtained without and with intravenous contrast. CONTRAST:  7.83mL GADAVIST GADOBUTROL 1 MMOL/ML IV SOLN COMPARISON:  None. FINDINGS: Alignment: Normal Vertebrae: Normal Cord: Cord is self appears normal.  See below. Posterior Fossa, vertebral arteries, paraspinal tissues: Normal Disc levels: No evidence of pre-existing disc disease. There is abnormal subdural fluid surrounding and compressing the thecal sac. Some epidural contrast enhancement occurs. No evidence of a discrete abscess in the cervical region. There is some susceptibility artifact within the subarachnoid space dorsal to the cord, notable on the axial GRE images at C2-3 and C6-7 that could be blood or air. Because it is in the dependent subarachnoid space, blood is favored. IMPRESSION: Subdural fluid extending all the way up to C1, surrounding and compressing the thecal sac. No evidence of focal subdural hematoma in the cervical region. Small amount of susceptibility artifact in the dorsal subarachnoid space in the cervical  region, favored to represent blood due to the dependent position. See results of thoracic and lumbar studies. Electronically Signed   By: Nelson Chimes M.D.   On: 02/08/2020 19:19   MR THORACIC SPINE W WO CONTRAST  Result Date: 02/08/2020 CLINICAL DATA:  Back pain. Myelopathy, acute or progressive. Childbirth today with epidural anesthesia. EXAM: MRI THORACIC WITHOUT AND WITH CONTRAST TECHNIQUE: Multiplanar and multiecho pulse sequences of the thoracic spine were obtained without and with intravenous contrast. CONTRAST:  7.74mL GADAVIST GADOBUTROL 1 MMOL/ML IV SOLN COMPARISON:  Cervical and lumbar studies same day FINDINGS: MRI THORACIC SPINE FINDINGS Alignment:  Normal Vertebrae: Normal Cord: No intrinsic cord abnormality seen. Subdural and epidural fluid surrounding the thecal sac, effacing the subarachnoid space surrounding the cord. No evidence of cord deformity or myelopathic signal. Foci of very low signal seen in the dorsal subdural/epidural space that could be air bubbles or small areas of blood accumulation. No evidence of dominant hematoma. Paraspinal and other soft tissues: Otherwise negative Disc levels: No evidence of pre-existing disc pathology. IMPRESSION: Subdural and possibly some epidural fluid surrounding and constricting the thecal sac, primarily dorsal to the thecal sac. Few small areas of air bubbles or possibly small areas of blood accumulation. No dominant hematoma. Electronically Signed   By: Nelson Chimes M.D.   On: 02/08/2020 19:28   MR Lumbar Spine W Wo Contrast  Result Date: 02/08/2020 CLINICAL DATA:  Epidural anesthesia for childbirth today. Severe pain. EXAM: MRI LUMBAR SPINE WITHOUT AND WITH CONTRAST TECHNIQUE: Multiplanar and multiecho pulse sequences of the lumbar spine were obtained without and with intravenous contrast. CONTRAST:  7.31mL GADAVIST GADOBUTROL 1 MMOL/ML IV SOLN COMPARISON:  Cervical and thoracic studies same day. FINDINGS: Segmentation:  5 lumbar type vertebral  bodies. Alignment:  Normal Vertebrae:  Normal Conus medullaris and cauda equina: Conus location is difficult to establish because of crowding of the neural structures. Probably L1-2. Paraspinal and other soft tissues: Post gravid uterus. Disc levels: No degenerative disc disease or facet disease. Normal size spinal canal. Subdural fluid surrounding and compressing the thecal sac. Areas of signal loss in the dorsal subdural and or epidural region could relate 2 air bubbles or some hemorrhage. IMPRESSION: Subdural fluid surrounding constricting the thecal sac, crowding the nerve roots together. Blood and or air in the dependent subdural up and possibly epidural region. No focal compressive hematoma. Electronically Signed   By: Nelson Chimes M.D.   On: 02/08/2020 19:23    Recent Labs    02/08/20 0030  HGB 10.5*  HCT 33.8*    Assessment/Plan:  Thanks to Dr Marcello Moores for seeing patient last evening  Assessment:  24 yo F with recent epidural anesthesia for delivery today who has subdural fluid collection consistent with subdural injection of anesthesia.  It is likely her neuropathic pain is resulting from mild traction on her nerve roots seen with shift of her cord anteriorly.  Plan:  - no neurosurgical intervention indicated - her subdural fluid collection will very likely resorb with time, though this may take days to weeks, but clinically improvement is likely to be seen sooner than that. - if she has persistent symptoms after 2 weeks, a repeat MRI of her cervical and thoracic spine can be performed. - she and her husband verbalized understanding and agreement Will add skelaxin to her pain management Encourage ambulation  Consider physical therapy consultation evaluation if her pain complaints do not improve    LOS: 1 day   Florian Buff 02/09/2020, 8:38 AM

## 2020-02-09 NOTE — BH Specialist Note (Deleted)
Integrated Behavioral Health via Telemedicine Video (Caregility) Visit  02/09/2020 Michaela Williams [id number]  Number of Integrated Behavioral Health visits: 2 Session Start time: 1:15***  Session End time: 1:45*** Total time: {IBH Total WUXL:24401027}  Referring Provider: Vernice Jefferson, NP Type of Visit: Video Patient/Family location: Home [facility] Provider location: [facility] [place] [facility]  All persons participating in visit: Patient Michaela Williams and Michaela Williams ***   Discussed confidentiality: Yes ***  I connected with Michaela Williams and/or Michaela Williams's {family members:20773} by a video enabled telemedicine application ([place]) and verified that I am speaking with the correct person using two identifiers.    I discussed that engaging in this virtual visit, they consent to the provision of behavioral healthcare and the services will be billed under their insurance.   Patient and/or legal guardian expressed understanding and consented to virtual visit: Yes   PRESENTING CONCERNS: Patient and/or family reports the following symptoms/concerns: *** Duration of problem: ***; Severity of problem: {Mild/Moderate/Severe:20260}  STRENGTHS (Protective Factors/Coping Skills): ***treatment ***  GOALS ADDRESSED: Patient will: 1.  Reduce symptoms of: {IBH Symptoms:21014056}  2.  Increase knowledge and/or ability of: {IBH Patient Tools:21014057}  3.  Demonstrate ability to: {IBH Goals:21014053}  INTERVENTIONS: Interventions utilized:  {IBH Interventions:21014054} Standardized Assessments completed: {IBH Screening Tools:21014051}  ASSESSMENT: Patient currently experiencing Bipolar 1 disorder***.   Patient may benefit from continued psychoeducation and brief therapeutic interventions regarding coping with symptoms of *** .  PLAN: 1. Follow up with behavioral health clinician on : *** 2. Behavioral recommendations:   -*** -*** 3. Referral(s): {IBH Referrals:21014055}  I discussed the assessment and treatment plan with the patient and/or parent/guardian. They were provided an opportunity to ask questions and all were answered. They agreed with the plan and demonstrated an understanding of the instructions.   They were advised to call back or seek an in-person evaluation if the symptoms worsen or if the condition fails to improve as anticipated.   Confirmed patient's address: Yes  Confirmed patient's phone number: Yes  Any changes to demographics: No   Confirmed patient's insurance: Yes  Any changes to patient's insurance: No   I discussed the limitations of evaluation and management by telemedicine and the availability of in person appointments.  I discussed that the purpose of this visit is to provide behavioral health care while limiting exposure to the novel coronavirus.   Discussed there is a possibility of technology failure and discussed alternative modes of communication if that failure occurs.  [name] Brendt Dible
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
## 2020-02-09 NOTE — Anesthesia Postprocedure Evaluation (Signed)
Anesthesia Post Note  Patient: Michaela Williams  Procedure(s) Performed: AN AD HOC LABOR EPIDURAL     Anesthesia Post Evaluation The patient stated that she was too tired to speak with me this morning. Her support person stated that they were both exhausted and are not sure exactly what has been explained to them.  I will defer the evaluation to the attending anesthesiologist. Last Vitals:  Vitals:   02/09/20 0406 02/09/20 0733  BP: 106/64 (!) 111/58  Pulse: 85 86  Resp: 20 18  Temp: 37 C 36.9 C  SpO2:  97%    Last Pain:  Vitals:   02/09/20 0733  TempSrc: Oral  PainSc:    Pain Goal: Patients Stated Pain Goal: 3 (02/09/20 1225)                 [name]

## 2020-02-09 NOTE — Progress Notes (Signed)
This RN and an NT attempted to ambulate pt to bathroom. Pt stated that her back was too painful and felt like it gave out on her. Pt did dangle on the side of the bed and tolerated fairly. Transferred pt from bed to chair and pt could not tolerate laying back in the chair because her neck was hurting her too bad. Pt was then transferred from the chair to the bed for her to be more comfortable. Dr. Kennon Rounds made aware. Verbal order received for PT eval.

## 2020-02-10 MED ORDER — SERTRALINE HCL 50 MG PO TABS
25.0000 mg | ORAL_TABLET | Freq: Every day | ORAL | Status: DC
  Administered 2020-02-10 – 2020-02-11 (×2): 25 mg via ORAL
  Filled 2020-02-10 (×2): qty 1

## 2020-02-10 NOTE — Progress Notes (Signed)
Notified by social work that the pt has current thoughts of harming herself, but has no active plan. Notified Dr. Kennon Rounds and awaiting orders. Notified house coverage RN as well.

## 2020-02-10 NOTE — Progress Notes (Signed)
I saw Michaela Williams this afternoon as follow up for anesthesiology. She is still complaining of having difficulty ambulating due lower back pain radiating to her neck. She said she has attempted to use a walker but is too unsteady on her feet. PT has been consulted and will obtain a walker for home use.I reviewed her MRI of her spine. She had some fluid around the thecal sac from her lumbar spine up to her cervical spine. This could be a normal finding from an epidural placed for labor. There was no neural compression noted by the radiologist but I have seen similar symptoms in patient with decreased compliance of the epidural space. This should resolve with time. I reassured the patient and the patient's mother. I feel like she will continue to improve daily and should completely recover with time. I will follow up with her in the am.

## 2020-02-10 NOTE — Progress Notes (Signed)
CSW acknowledged consult and attempted to meet with MOB however, MOB was about to eat and FOB was present feeding infant. CSW agreed to come back at a later time.  Abundio Miu, [place] Worker [facility] Cell#: [phone]

## 2020-02-10 NOTE — Progress Notes (Signed)
I offered listening presence to [facility] and she was able to share some with me.  She said that she often processes things on her own because that is how she had to do things as a child and that it is hard to reach out for resources now because she is used to coping on her own.  We talked about her love for her children and how she wants to parent them and we talked about some of her formative experiences as a child and about her experience with bipolar disorder.  She is learning to trust herself and is willing to reach out for support now.  I gave her information for counseling resources including the number for [facility] whom she has seen at her outpatient appointments.    [place], [place] Pager, [phone] 5:08 PM

## 2020-02-10 NOTE — Addendum Note (Signed)
Addendum  created 02/10/20 1707 by Josephine Igo, MD   Clinical Note Signed

## 2020-02-10 NOTE — TOC Transition Note (Signed)
Transition of Care [facility]) - CM/SW Discharge Note   Patient Details  Name: Michaela Williams MRN: [id number] Date of Birth: 01/11/1996  [facility]) CM/SW Contact:  Fuller Mandril, RN Phone Number: 02/10/2020, 2:12 PM   Clinical Narrative:    RNCM consulted regarding home health services and durable medical equipment for post partum patient.  RNCM placed referral to several home health agencies and is awaiting approval/denial.  RNCM contacted Zack Blank of [place] to deliver rolling walker to pt at bedside prior to discharge.   Final next level of care: [place] Barriers to Discharge: Equipment Delay, No [place] will accept this patient, Barriers Resolved   Patient Goals and CMS Choice        Discharge Placement                       Discharge Plan and Services                DME Arranged: Walker rolling DME Agency: [employer] Date DME Agency Contacted: 02/10/20 Time DME Agency Contacted: 2919 Representative spoke with at DME Agency: [place] Interventions     Readmission Risk Interventions No flowsheet data found.
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
## 2020-02-10 NOTE — Lactation Note (Signed)
This note was copied from a baby's chart. Lactation Consultation Note  Patient Name: Michaela Williams HKGOV'P Date: 02/10/2020 Reason for consult: Follow-up assessment  P2 mother whose infant is now 27 hours old.  This is a term baby at 40+0 weeks.  Mother breast fed her first child (now 25 years old) for 6 months.  Mother has been going through breast cancer treatment prior to becoming pregnant.  Mother' preference is breast/bottle.  Mother has also had some difficulty with her epidural with this pregnancy.  She informed me that she has had severe head and neck pain and, at one time, could not feel her left leg at all.  She has regained sensation to her lower extremiities.  She is moving her head more and sitting upright today.    Baby had just finished a 10 minute feeding on the left breast when I arrived.  Reviewed how to stimulate baby to keep her actively awake and sucking.  Suggested mother begin pumping after feedings to help obtain and maintain a good milk supply due to mother not being very mobile and having a hard time to consistently breast feed and manipulate baby during feedings.  Her right breast is more full this morning than her left breast.  Mother is happy to begin pumping.  Reviewed pump and set up and observed mother for 5 minutes prior to departure.  She was already obtaining a few mls of EBM from both breasts. Mother aware of milk storage times and to feed her EBM prior to giving any formula supplementation.  Praised her accomplishments and provided emotional support.  Encouraged her to continue her hard work with breast feeding.  Mother appreciative of all help and education given.  She has been able to check with her parents insurance company and will be able to obtain a DEBP for home use.  She plans to obtain the formula package from [facility].  Father present and asleep in the chair.  Asked mother to call for any further questions/concerns.  RN updated.   Maternal Data     Feeding Feeding Type: Breast Fed  LATCH Score                   Interventions    Lactation Tools Discussed/Used     Consult Status Consult Status: Follow-up Date: 02/11/20 Follow-up type: In-patient    Merel Santoli R Marlene Beidler 02/10/2020, 9:16 AM

## 2020-02-10 NOTE — Evaluation (Signed)
Physical Therapy Evaluation Patient Details Name: Michaela Williams MRN: [id number] DOB: 1995/09/21 Today's Date: 02/10/2020   History of Present Illness  Pt s/p vaginal delivery on 02/08/20. Pt with epidural. Pt with severe neck and back pain after delivery. Neurosurgery consulted. 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   Clinical Impression  Pt presents to PT with limited mobility due to severe neck and back pain. Expect progress will be limited until pain begins improving. Pt agreeable to therapy and motivated to work toward return to independence. Lives in second story apartment and will have to negotiate a flight of steps to get in.     Follow Up Recommendations Home health PT;Supervision for mobility/OOB    Equipment Recommendations  Rolling walker with 5" wheels    Recommendations for Other Services       Precautions / Restrictions Precautions Precautions: Fall      Mobility  Bed Mobility Overal bed mobility: Needs Assistance Bed Mobility: Sidelying to Sit;Sit to Sidelying   Sidelying to sit: Mod assist;HOB elevated     Sit to sidelying: Min assist;HOB elevated General bed mobility comments: Assist to elevate trunk into sitting. Assit to bring legs up into bed returning to supine. Verbal cues for technique  Transfers Overall transfer level: Needs assistance Equipment used: Rolling walker (2 wheeled) Transfers: Sit to/from Stand Sit to Stand: Mod assist         General transfer comment: Assist to bring hips up and for balance. Pt unable to fully extend trunk and neck in midline. Slight trunk rotation to the rt  Ambulation/Gait Ambulation/Gait assistance: Mod assist Gait Distance (Feet): 2 Feet Assistive device: Rolling walker (2 wheeled) Gait Pattern/deviations: Step-to pattern;Decreased step length -  right;Decreased step length - left;Shuffle;Trunk flexed Gait velocity: decr Gait velocity interpretation: <1.31 ft/sec, indicative of household ambulator General Gait Details: Assist to side step af couple of feet up the side of the bed.   Stairs            Wheelchair Mobility    Modified Rankin (Stroke Patients Only)       Balance Overall balance assessment: Needs assistance Sitting-balance support: Feet supported;No upper extremity supported Sitting balance-Leahy Scale: Fair Sitting balance - Comments: dynamic movement limited due to pain   Standing balance support: Bilateral upper extremity supported Standing balance-Leahy Scale: Poor Standing balance comment: walker and min assist for static standing. Static standing x 2 for ~60-90 sec                             Pertinent Vitals/Pain Pain Assessment: Faces Faces Pain Scale: Hurts whole lot Pain Location: neck and back Pain Descriptors / Indicators: Guarding;Grimacing;Shooting Pain Intervention(s): Limited activity within patient's tolerance;Monitored during session;Premedicated before session;Repositioned;Relaxation    Home Living Family/patient expects to be discharged to:: Private residence   Available Help at Discharge: Family (father of baby reports he is off work x 2 weeks and can help) Type of Home: Apartment Home Access: Stairs to enter Entrance Stairs-Rails: Right Entrance Stairs-Number of Steps: flight Home Layout: One level Home Equipment: None      Prior Function Level of Independence: Independent               Hand Dominance        Extremity/Trunk Assessment   Upper Extremity Assessment  Upper Extremity Assessment: RUE deficits/detail;LUE deficits/detail RUE: Unable to fully assess due to pain LUE: Unable to fully assess due to pain    Lower Extremity Assessment Lower Extremity Assessment: RLE deficits/detail;LLE deficits/detail RLE: Unable to fully assess due to  pain LLE: Unable to fully assess due to pain    Cervical / Trunk Assessment Cervical / Trunk Assessment: Other exceptions Cervical / Trunk Exceptions: severely limited by pain. Head in forward position. Unable to extend to neutral  Communication   Communication: No difficulties  Cognition Arousal/Alertness: Awake/alert Behavior During Therapy: WFL for tasks assessed/performed Overall Cognitive Status: Within Functional Limits for tasks assessed                                        General Comments      Exercises     Assessment/Plan    PT Assessment Patient needs continued PT services  PT Problem List Decreased activity tolerance;Decreased mobility;Pain       PT Treatment Interventions DME instruction;Gait training;Stair training;Functional mobility training;Therapeutic activities;Therapeutic exercise;Patient/family education    PT Goals (Current goals can be found in the Care Plan section)  Acute Rehab PT Goals Patient Stated Goal: get better and go home PT Goal Formulation: With patient Time For Goal Achievement: 02/17/20 Potential to Achieve Goals: Fair    Frequency Min 3X/week   Barriers to discharge Inaccessible home environment Lives in second story apt    Co-evaluation               AM-PAC PT "6 Clicks" Mobility  Outcome Measure Help needed turning from your back to your side while in a flat bed without using bedrails?: A Lot Help needed moving from lying on your back to sitting on the side of a flat bed without using bedrails?: A Lot Help needed moving to and from a bed to a chair (including a wheelchair)?: Total Help needed standing up from a chair using your arms (e.g., wheelchair or bedside chair)?: A Lot Help needed to walk in hospital room?: Total Help needed climbing 3-5 steps with a railing? : Total 6 Click Score: 9    End of Session Equipment Utilized During Treatment: Gait belt Activity Tolerance: Patient limited by  pain Patient left: in bed;with call bell/phone within reach;with family/visitor present Nurse Communication: Mobility status PT Visit Diagnosis: Other abnormalities of gait and mobility (R26.89);Pain Pain - part of body:  (back and nec)    Time: 1102-1117 PT Time Calculation (min) (ACUTE ONLY): 27 min   Charges:   PT Evaluation $PT Eval Moderate Complexity: 1 Mod PT Treatments $Therapeutic Activity: 8-22 mins        [place] Pager [phone] Office [place] 02/10/2020, 10:27 AM

## 2020-02-10 NOTE — Progress Notes (Signed)
Physical Therapy Treatment Patient Details Name: Michaela Williams MRN: [id number] DOB: [birth year] Today's Date: 02/10/2020    History of Present Illness Pt s/p vaginal delivery on 02/08/20. Pt with epidural. Pt with severe neck and back pain after delivery. Neurosurgery consulted. 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     PT Comments    Saw pt for second session. Pt making steady progress but continues to be severely limited with mobility due to neck/back pain. Able to amb with walker 5' which is better than this AM. Unable to tolerate sitting in a chair due to unable to get neck into comfortable position.  Pt lives in second story apartment and needs to be able to negotiate stairs to return home. Explored option of pt possibly staying at parents home but neither patient nor mother see that as an option.   Follow Up Recommendations  Home health PT;Supervision for mobility/OOB (when pt able to negotiate stairs into 2nd story apartment)     Equipment Recommendations  Rolling walker with 5" wheels    Recommendations for Other Services       Precautions / Restrictions Precautions Precautions: Fall    Mobility  Bed Mobility Overal bed mobility: Needs Assistance Bed Mobility: Sidelying to Sit;Sit to Sidelying   Sidelying to sit: Min guard;HOB elevated     Sit to sidelying: Min guard;HOB elevated General bed mobility comments: Incr time and effort  Transfers Overall transfer level: Needs assistance Equipment used: Rolling walker (2 wheeled) Transfers: Sit to/from [facility] Sit to Stand: Min assist Stand pivot transfers: Min assist       General transfer comment: assist to bring hips up. Incr time to rise and to extend trunk. Neck with forward flexion  Ambulation/Gait Ambulation/Gait assistance:  Min assist Gait Distance (Feet): 5 Feet (forward and backward) Assistive device: Rolling walker (2 wheeled) Gait Pattern/deviations: Step-to pattern;Decreased step length - right;Decreased step length - left;Trunk flexed;Narrow base of support;Decreased stride length Gait velocity: decr Gait velocity interpretation: <1.31 ft/sec, indicative of household ambulator General Gait Details: Assist for support.    Stairs             Wheelchair Mobility    Modified Rankin (Stroke Patients Only)       Balance Overall balance assessment: Needs assistance Sitting-balance support: Feet supported;No upper extremity supported Sitting balance-Leahy Scale: Fair Sitting balance - Comments: dynamic movement limited due to pain   Standing balance support: Bilateral upper extremity supported Standing balance-Leahy Scale: Poor Standing balance comment: walker and min assist for static standing.                             Cognition Arousal/Alertness: Awake/alert Behavior During Therapy: WFL for tasks assessed/performed Overall Cognitive Status: Within Functional Limits for tasks assessed                                        Exercises Other Exercises Other Exercises: This AM instructed in chin tucks and AROM of neck rotation.  Other Exercises: Instructed in gentle back extension in supine    General Comments        Pertinent Vitals/Pain Pain Assessment: Faces Pain Score: 9  (with mobility, 5/10 at rest) Pain  Location: neck Pain Descriptors / Indicators: Guarding;Grimacing;Aching Pain Intervention(s): Limited activity within patient's tolerance;Monitored during session;Repositioned    Home Living                      Prior Function            PT Goals (current goals can now be found in the care plan section) Acute Rehab PT Goals Patient Stated Goal: get better and go home Progress towards PT goals: Progressing toward goals     Frequency    Min 3X/week      PT Plan Current plan remains appropriate    Co-evaluation              AM-PAC PT "6 Clicks" Mobility   Outcome Measure  Help needed turning from your back to your side while in a flat bed without using bedrails?: A Little Help needed moving from lying on your back to sitting on the side of a flat bed without using bedrails?: A Little Help needed moving to and from a bed to a chair (including a wheelchair)?: A Little Help needed standing up from a chair using your arms (e.g., wheelchair or bedside chair)?: A Little Help needed to walk in hospital room?: A Little Help needed climbing 3-5 steps with a railing? : Total 6 Click Score: 16    End of Session   Activity Tolerance: Patient limited by pain Patient left: in bed;with call bell/phone within reach;with family/visitor present Nurse Communication: Mobility status PT Visit Diagnosis: Other abnormalities of gait and mobility (R26.89);Pain Pain - part of body:  (back and nec)     Time: 5956-3875 PT Time Calculation (min) (ACUTE ONLY): 23 min  Charges:  $Gait Training: 8-22 mins                     [place] Pager [phone] Office [place] 02/10/2020, 4:40 PM

## 2020-02-10 NOTE — Progress Notes (Addendum)
Post Partum Day 2 Subjective: still with pain in her back and having to sit up to be comfortable. Unable to walk yesterday.  Objective: Blood pressure 112/70, pulse 73, temperature 98.3 F (36.8 C), temperature source Oral, resp. rate 18, height 5\' 1"  (1.549 m), weight 74.7 kg, last menstrual period 05/04/2019, SpO2 99 %, unknown if currently breastfeeding.  Physical Exam:  General: alert, cooperative and appears stated age Lochia: appropriate Uterine Fundus: firm DVT Evaluation: No evidence of DVT seen on physical exam.  Recent Labs    02/08/20 0030  HGB 10.5*  HCT 33.8*    Assessment/Plan: Breastfeeding and Contraception IUD placed immedicately after placenta delivered  PT consult today to see what might be needed to assist with ambulation. Discussed with anesthesia, they will see her today. Hopefully, will be able to d/c in am.   LOS: 2 days   Michaela Williams 02/10/2020, 10:28 AM

## 2020-02-11 DIAGNOSIS — F4321 Adjustment disorder with depressed mood: Secondary | ICD-10-CM

## 2020-02-11 MED ORDER — OXYCODONE-ACETAMINOPHEN 5-325 MG PO TABS: 1.0000 | ORAL_TABLET | Freq: Four times a day (QID) | ORAL | 0 refills | Status: DC | PRN

## 2020-02-11 MED ORDER — IBUPROFEN 600 MG PO TABS: 600.0000 mg | ORAL_TABLET | Freq: Four times a day (QID) | ORAL | 0 refills | Status: DC

## 2020-02-11 MED ORDER — SERTRALINE HCL 25 MG PO TABS: 25.0000 mg | ORAL_TABLET | Freq: Every day | ORAL | 2 refills | Status: DC

## 2020-02-11 MED ORDER — METAXALONE 800 MG PO TABS: 800.0000 mg | ORAL_TABLET | Freq: Three times a day (TID) | ORAL | 1 refills | Status: DC

## 2020-02-11 NOTE — Progress Notes (Addendum)
Notified Dr. Darleene Cleaver (psych day consult MD) of psych consult for this pt.
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
## 2020-02-11 NOTE — TOC Progression Note (Signed)
Transition of Care [facility]) - Progression Note    Patient Details  Name: Michaela Williams MRN: [id number] Date of Birth: January 29, 1996  [facility] CM/SW Contact  Claudie Leach, RN 02/11/2020, 3:08 PM  Clinical Narrative:    Patient to d/c home alone with boyfriend to assist.  HHPT ordered for patient.   [facility]: declined due to postpartum status [facility]: declined due to psych history [facility]: accepted pending insurance benefits.  They will inform patient on Monday if they are in network with her insurance plan.    The patient is on her father's insurance.  Father's DOB is 07/11/63.  Patient's address and mobile # in epic confirmed.  Patient would like for [facility] agency to call [phone].    If [place] [facility] is not in network with her insurance, she understands to call [facility] for an appointment.  It is unlikely that any other [employer] will accept her referral due to [place], postpartum status, and psych history.  She confirms that she has transportation and would be able to go to outpatient rehab if no HH options available.  Referral sent to Outpatient Rehab.         Barriers to Discharge: No Barriers Identified  Expected Discharge Plan and Services           Expected Discharge Date: 02/11/20               DME Arranged: Gilford Rile rolling DME Agency: [employer] Date DME Agency Contacted: 02/10/20 Time DME Agency Contacted: [id number] Representative spoke with at DME Agency: [place]: PT [place]: [place] ([place]) Date [place]: 02/11/20 Time [place]: [id number] Representative spoke with at [place]: Corene Cornea

## 2020-02-11 NOTE — Progress Notes (Signed)
CSW was contacted by Michaela Williams after psych consult completed. Michaela Williams informed CSW that Dr. Darleene Cleaver requested this CSW provided MOB with [facility] and psychiatry/medication resources. CSW visited MOB at bedside to offer support and resources. On arrival, CSW introduced self and stated purpose for visit. FOB and infant Michaela Williams were present, however, FOB stepped out of room to offer MOB privacy during [place] visit. MOB and FOB were pleasant during visit.   CSW confirmed MOB's intent to maintained Michaela Williams appointment with [facility]. MOB stated plan to attend.  During 02/09/20 CSW consult with CSW Michaela Williams, [place] shared preference regarding Michaela and requested to have a new Michaela.  Michaela Williams made a new referral for outpatient counseling for MOB and Michaela Williams) agreed to make contact with MOB regarding scheduling a new appointment. This CSW confirmed MOB understanding on new referral. This CSW confirmed with RN that MOB will discharge with Rx for Zoloft. RN stated MOB will dc with Rx.  MOB confirmed understanding, plan to fill Rx, and followup with one of psychiartrist on list provided by CSW.CSW also provided MOB with new copy of New Mom Checklist for Maternal Mental Health Help and Perinatal Mood Disorders Resource list for [facility].  This CSW offered to refer MOB to [facility] program for additional parenting support. MOB agreed to referral.  CSW made referral via fax.    CSW identifies no further need for intervention and no barriers to discharge at this time.  Michaela Williams, MSW, [place] Clinical Social Worker [phone]

## 2020-02-11 NOTE — Progress Notes (Signed)
Pt was asleep when I arrived; her SO [name] was bedside caring for their baby. I offered to come back when pt is awake and would like to come back. Please page if assistance is needed. [place], [place]   02/11/20 1400  Clinical Encounter Type  Visited With Family

## 2020-02-11 NOTE — Progress Notes (Signed)
Physical Therapy Treatment Patient Details Name: Michaela Williams MRN: [id number] DOB: 05/01/1996 Today's Date: 02/11/2020    History of Present Illness Pt s/p vaginal delivery on 02/08/20. Pt with epidural. Pt with severe neck and back pain after delivery. Neurosurgery consulted. 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     PT Comments    Pt making steady progress. Able to amb in room and to bathroom with assistance. Tolerating slightly more movement of neck. Worked on passive range of motion of neck in supine. Instructed pt to amb in room and to bathroom with staff or father of the baby throughout the day. Instructed pt to sit on EOB for brief periods throughout the day. Will continue to see working toward enough mobility so patient can negotiate stairs into her 2nd story apartment.    Follow Up Recommendations  Home health PT;Supervision for mobility/OOB (when pt able to negotiate stairs into 2nd story apartment)     Equipment Recommendations  Rolling walker with 5" wheels    Recommendations for Other Services       Precautions / Restrictions Precautions Precautions: Fall    Mobility  Bed Mobility Overal bed mobility: Modified Independent Bed Mobility: Sidelying to Sit;Sit to Sidelying   Sidelying to sit: Modified independent (Device/Increase time);HOB elevated     Sit to sidelying: Modified independent (Device/Increase time);HOB elevated General bed mobility comments: Incr time and effort and use of bed rail but no assist  Transfers Overall transfer level: Needs assistance Equipment used: Rolling walker (2 wheeled) Transfers: Sit to/from Stand Sit to Stand: Min assist         General transfer comment: assist with stability when rising. Incr time to rise and to extend trunk.    Ambulation/Gait Ambulation/Gait assistance: Min assist Gait Distance (Feet): 20 Feet (20' x 1, 10' x 1) Assistive device: Rolling walker (2 wheeled) Gait Pattern/deviations: Step-to pattern;Decreased step length - right;Decreased step length - left;Trunk flexed;Narrow base of support Gait velocity: decr Gait velocity interpretation: <1.31 ft/sec, indicative of household ambulator General Gait Details: Assist for support.    Stairs             Wheelchair Mobility    Modified Rankin (Stroke Patients Only)       Balance Overall balance assessment: Needs assistance Sitting-balance support: Feet supported;No upper extremity supported Sitting balance-Leahy Scale: Fair Sitting balance - Comments: dynamic movement limited due to pain   Standing balance support: Single extremity supported;During functional activity Standing balance-Leahy Scale: Poor Standing balance comment: UE support and min guard for static standing.                             Cognition Arousal/Alertness: Awake/alert Behavior During Therapy: WFL for tasks assessed/performed Overall Cognitive Status: Within Functional Limits for tasks assessed                                        Exercises Other Exercises Other Exercises: Instructed pt in active lateral neck rotation. Other Exercises: Reinforced use of chin tucks Other Exercises: Instructed to lie in supine for periods to allow gravity to facilitate supported neck extension    General Comments        Pertinent Vitals/Pain Pain Assessment:  Faces Faces Pain Scale: Hurts even more Pain Location: neck and back Pain Descriptors / Indicators: Guarding;Grimacing;Aching Pain Intervention(s): Premedicated before session;Repositioned;Monitored during session;Limited activity within patient's tolerance;Relaxation    Home Living                      Prior Function            PT Goals (current goals can now be  found in the care plan section) Acute Rehab PT Goals Patient Stated Goal: get better and go home Progress towards PT goals: Progressing toward goals    Frequency    Min 3X/week      PT Plan Current plan remains appropriate    Co-evaluation              AM-PAC PT "6 Clicks" Mobility   Outcome Measure  Help needed turning from your back to your side while in a flat bed without using bedrails?: A Little Help needed moving from lying on your back to sitting on the side of a flat bed without using bedrails?: A Little Help needed moving to and from a bed to a chair (including a wheelchair)?: A Little Help needed standing up from a chair using your arms (e.g., wheelchair or bedside chair)?: A Little Help needed to walk in hospital room?: A Little Help needed climbing 3-5 steps with a railing? : Total 6 Click Score: 16    End of Session   Activity Tolerance: Patient limited by pain Patient left: in bed;with call bell/phone within reach;with family/visitor present Nurse Communication: Mobility status PT Visit Diagnosis: Other abnormalities of gait and mobility (R26.89);Pain Pain - part of body:  (back and neck)     Time: 2683-4196 PT Time Calculation (min) (ACUTE ONLY): 34 min  Charges:  $Gait Training: 8-22 mins $Therapeutic Activity: 8-22 mins                     [place] Pager [phone] Office [place] 02/11/2020, 10:36 AM

## 2020-02-11 NOTE — Progress Notes (Signed)
Discharge instructions and prescriptions given to pt. Discussed post vaginal delivery care, signs and symptoms to report to the MD, upcoming appointments, and meds. Pt verbalizes understanding and has no questions or concerns at this time. Pt has all necessary supplies and equipment for discharge. Pt discharged home with baby in stable condition.

## 2020-02-11 NOTE — Progress Notes (Signed)
Physical Therapy Treatment Patient Details Name: Michaela Williams MRN: [id number] DOB: 1996/07/02 Today's Date: 02/11/2020    History of Present Illness Pt s/p vaginal delivery on 02/08/20. Pt with epidural. Pt with severe neck and back pain after delivery. Neurosurgery consulted. 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     PT Comments    Pt seen for second session to address stairs. Pt able to go up down a portable step. Easier for pt to go up the step than down. Incr pain stepping down with LLE so had pt step down with RLE with better results. Pt will have assistance of father of the baby and her father to get in at home. Father of the baby present during session.    Follow Up Recommendations  Home health PT;Supervision for mobility/OOB     Equipment Recommendations  Rolling walker with 5" wheels    Recommendations for Other Services       Precautions / Restrictions Precautions Precautions: Fall    Mobility  Bed Mobility Overal bed mobility: Modified Independent Bed Mobility: Sidelying to Sit;Sit to Sidelying   Sidelying to sit: Modified independent (Device/Increase time);HOB elevated     Sit to sidelying: Modified independent (Device/Increase time);HOB elevated General bed mobility comments: Incr time and effort and use of bed rail but no assist  Transfers Overall transfer level: Needs assistance Equipment used: Rolling walker (2 wheeled) Transfers: Sit to/from Stand Sit to Stand: Min guard         General transfer comment: assist for safety. Incr time to rise and to extend trunk.   Ambulation/Gait Ambulation/Gait assistance: Min assist Gait Distance (Feet): 10 Feet Assistive device: Rolling walker (2 wheeled) Gait Pattern/deviations: Step-to pattern;Decreased step length - right;Decreased step length -  left;Trunk flexed;Narrow base of support Gait velocity: decr Gait velocity interpretation: <1.31 ft/sec, indicative of household ambulator General Gait Details: Assist for support.    Stairs Stairs: Yes Stairs assistance: Min assist Stair Management: Two rails;Step to pattern;Forwards Number of Stairs: 1 General stair comments: Use portable step and walker to simulate stair with rails. Pt initially tried to step down with LLE and had incr back pain with that. Able to step down with rt foot.    Wheelchair Mobility    Modified Rankin (Stroke Patients Only)       Balance Overall balance assessment: Needs assistance Sitting-balance support: Feet supported;No upper extremity supported Sitting balance-Leahy Scale: Fair Sitting balance - Comments: dynamic movement limited due to pain   Standing balance support: Single extremity supported;During functional activity Standing balance-Leahy Scale: Poor Standing balance comment: UE support and min guard for static standing.                             Cognition Arousal/Alertness: Awake/alert Behavior During Therapy: WFL for tasks assessed/performed Overall Cognitive Status: Within Functional Limits for tasks assessed                                        Exercises      General Comments        Pertinent Vitals/Pain Pain Assessment: Faces Faces Pain Scale: Hurts whole lot Pain Location: neck and back Pain Descriptors / Indicators: Guarding;Grimacing;Aching Pain Intervention(s): Monitored during  session;Repositioned    Home Living                      Prior Function            PT Goals (current goals can now be found in the care plan section) Acute Rehab PT Goals Patient Stated Goal: get better and go home Progress towards PT goals: Progressing toward goals    Frequency    Min 3X/week      PT Plan Current plan remains appropriate    Co-evaluation               AM-PAC PT "6 Clicks" Mobility   Outcome Measure  Help needed turning from your back to your side while in a flat bed without using bedrails?: A Little Help needed moving from lying on your back to sitting on the side of a flat bed without using bedrails?: A Little Help needed moving to and from a bed to a chair (including a wheelchair)?: A Little Help needed standing up from a chair using your arms (e.g., wheelchair or bedside chair)?: A Little Help needed to walk in hospital room?: A Little Help needed climbing 3-5 steps with a railing? : A Lot 6 Click Score: 17    End of Session   Activity Tolerance: Patient limited by pain Patient left: in bed;with call bell/phone within reach;with family/visitor present Nurse Communication: Mobility status PT Visit Diagnosis: Other abnormalities of gait and mobility (R26.89);Pain Pain - part of body:  (back and neck)     Time: 3419-6222 PT Time Calculation (min) (ACUTE ONLY): 14 min  Charges:  $Gait Training: 8-22 mins                     [place] Pager [phone] Office [place] 02/11/2020, 4:54 PM

## 2020-02-11 NOTE — Lactation Note (Signed)
This note was copied from a baby's chart. Lactation Consultation Note  Patient Name: Girl Asya Derryberry WSFKC'L Date: 02/11/2020 Reason for consult: Follow-up assessment;Term  LC in to visit with P2 Mom of term baby at 86 hrs old.  Baby is at 4% weight loss.  She is breastfeeding and formula feeding.  Mom has a DEBP set up at bedside.  Pump parts drying on paper towel by sink.  Provided a separate bin to place pump parts on counter away from sink.  Soap provided for washing.   Mom laid back in bed with baby stretch out latched well to breast.  Baby feeding well.  When baby came off, nipple rounded and not pinched.  Hand expressed drops of transitional milk.    Encouraged more breastfeeding than formula to avoid engorgement, recommended keeping baby STS and offering breast often with cues.  Mom aware of importance of pumping if baby receives formula.    Mom just had PT visit due to difficulty following her epidural anesthesia in labor.  Mom states she is doing much better today.  Mom with history of bipolar depression and suicidal ideation requiring a sitter while here in hospital.    Mom knows to ask for help prn. Maternal Data    Feeding Feeding Type: Breast Fed  LATCH Score Latch: Grasps breast easily, tongue down, lips flanged, rhythmical sucking.  Audible Swallowing: Spontaneous and intermittent  Type of Nipple: Everted at rest and after stimulation  Comfort (Breast/Nipple): Soft / non-tender  Hold (Positioning): No assistance needed to correctly position infant at breast.  LATCH Score: 10  Interventions Interventions: Breast feeding basics reviewed;DEBP;Hand express;Skin to skin;Breast massage  Lactation Tools Discussed/Used Tools: Pump;Bottle Breast pump type: Double-Electric Breast Pump   Consult Status Consult Status: Follow-up Date: 02/12/20 Follow-up type: In-patient    [place] John 02/11/2020, 10:38 AM

## 2020-02-13 ENCOUNTER — Telehealth: Payer: Self-pay | Admitting: *Deleted

## 2020-02-13 NOTE — Telephone Encounter (Signed)
Attempted to contact pt x 2 to complete transition of care assessment; message states "call can not be completed at this time"; unable to leave message.  Lenor Coffin, RN, BSN, [place] Patient [place] [phone]

## 2020-02-14 ENCOUNTER — Telehealth: Payer: Self-pay | Admitting: *Deleted

## 2020-02-14 NOTE — Telephone Encounter (Signed)
2nd attempted to contact pt to complete transition of care assessment. Message states "call can not be completed at this time." Unable to leave message.  Lenor Coffin, RN, BSN, [place] Patient [place] [phone]

## 2020-02-15 ENCOUNTER — Encounter (HOSPITAL_COMMUNITY): Payer: Self-pay | Admitting: Family Medicine

## 2020-02-15 ENCOUNTER — Inpatient Hospital Stay (HOSPITAL_COMMUNITY)
Admission: AD | Admit: 2020-02-15 | Discharge: 2020-02-15 | Disposition: A | Discharge status: Home / Self Care | Payer: Medicaid Other | Attending: Family Medicine | Admitting: Family Medicine

## 2020-02-15 DIAGNOSIS — I959 Hypotension, unspecified: Secondary | ICD-10-CM | POA: Diagnosis not present

## 2020-02-15 DIAGNOSIS — F329 Major depressive disorder, single episode, unspecified: Secondary | ICD-10-CM | POA: Diagnosis not present

## 2020-02-15 DIAGNOSIS — M545 Low back pain: Secondary | ICD-10-CM | POA: Diagnosis not present

## 2020-02-15 DIAGNOSIS — R519 Headache, unspecified: Secondary | ICD-10-CM | POA: Diagnosis not present

## 2020-02-15 DIAGNOSIS — M5489 Other dorsalgia: Secondary | ICD-10-CM | POA: Diagnosis not present

## 2020-02-15 DIAGNOSIS — Z87891 Personal history of nicotine dependence: Secondary | ICD-10-CM | POA: Diagnosis not present

## 2020-02-15 DIAGNOSIS — O9089 Other complications of the puerperium, not elsewhere classified: Secondary | ICD-10-CM

## 2020-02-15 DIAGNOSIS — Z79899 Other long term (current) drug therapy: Secondary | ICD-10-CM | POA: Diagnosis not present

## 2020-02-15 DIAGNOSIS — M542 Cervicalgia: Secondary | ICD-10-CM | POA: Diagnosis not present

## 2020-02-15 LAB — BASIC METABOLIC PANEL
Anion gap: 9 (ref 5–15)
BUN: 15 mg/dL (ref 6–20)
CO2: 22 mmol/L (ref 22–32)
Calcium: 8.8 mg/dL — ABNORMAL LOW (ref 8.9–10.3)
Chloride: 109 mmol/L (ref 98–111)
Creatinine, Ser: 0.63 mg/dL (ref 0.44–1.00)
GFR calc Af Amer: >60 mL/min (ref >60)
GFR calc non Af Amer: >60 mL/min (ref >60)
Glucose, Bld: 121 mg/dL — ABNORMAL HIGH (ref 70–99)
Potassium: 4.2 mmol/L (ref 3.5–5.1)
Sodium: 140 mmol/L (ref 135–145)

## 2020-02-15 LAB — CBC
HCT: 37.7 % (ref 36.0–46.0)
Hemoglobin: 11.7 g/dL — ABNORMAL LOW (ref 12.0–15.0)
MCH: 27.3 pg (ref 26.0–34.0)
MCHC: 31 g/dL (ref 30.0–36.0)
MCV: 88.1 fL (ref 80.0–100.0)
Platelets: 369 10*3/uL (ref 150–400)
RBC: 4.28 MIL/uL (ref 3.87–5.11)
RDW: 15.4 % (ref 11.5–15.5)
WBC: 11.2 10*3/uL — ABNORMAL HIGH (ref 4.0–10.5)
nRBC: 0 % (ref 0.0–0.2)

## 2020-02-15 MED ORDER — KETOROLAC TROMETHAMINE 30 MG/ML IJ SOLN
15.0000 mg | Freq: Once | INTRAMUSCULAR | Status: AC
  Administered 2020-02-15: 15 mg via INTRAVENOUS
  Filled 2020-02-15: qty 1

## 2020-02-15 MED ORDER — METOCLOPRAMIDE HCL 5 MG/ML IJ SOLN
10.0000 mg | Freq: Once | INTRAMUSCULAR | Status: AC
  Administered 2020-02-15: 10 mg via INTRAVENOUS
  Filled 2020-02-15: qty 2

## 2020-02-15 MED ORDER — DIPHENHYDRAMINE HCL 25 MG PO CAPS
25.0000 mg | ORAL_CAPSULE | Freq: Once | ORAL | Status: AC
  Administered 2020-02-15: 25 mg via ORAL
  Filled 2020-02-15: qty 1

## 2020-02-15 MED ORDER — LACTATED RINGERS IV BOLUS
1000.0000 mL | Freq: Once | INTRAVENOUS | Status: AC
  Administered 2020-02-15: 1000 mL via INTRAVENOUS

## 2020-02-15 NOTE — MAU Note (Addendum)
Pt is s/p NSVD on 8/4. She arrived to MAU via EMS.  She reports neck and back pain r/t the epidural she had in labor. Also reporting a headache. This began postpartum in the hospital. Patient rating headache 10/10. Neck and back pain is 8/10. She states she did not realize how much pain she was in until yesterday. She believes her pain medicine was masking the pain. She stopped taking the pain medicine on Monday night. Pt took ibuprofen at 1200. Did not get any relief from this.

## 2020-02-15 NOTE — Discharge Instructions (Signed)
-tylenol 1000 mg every 6-8 hours alternating with ibuprofen 800 my every 4-6 hours for headache -ensure you are drinking 1 liter of water daily -eat small meals every 2-3 hours -ensure you are getting adequate sleep -discontinue percocet -continue with physical therapy for neck and back pain -continue to walk, do not stay sedentary in bed/chair -make appt with primary care doctor if symptoms are not improving   General Headache Without Cause A headache is pain or discomfort that is felt around the head or neck area. There are many causes and types of headaches. In some cases, the cause may not be found. Follow these instructions at home: Watch your condition for any changes. Let your doctor know about them. Take these steps to help with your condition: Managing pain      Take over-the-counter and prescription medicines only as told by your doctor.  Lie down in a dark, quiet room when you have a headache.  If told, put ice on your head and neck area: ? Put ice in a plastic bag. ? Place a towel between your skin and the bag. ? Leave the ice on for 20 minutes, 2-3 times per day.  If told, put heat on the affected area. Use the heat source that your doctor recommends, such as a moist heat pack or a heating pad. ? Place a towel between your skin and the heat source. ? Leave the heat on for 20-30 minutes. ? Remove the heat if your skin turns bright red. This is very important if you are unable to feel pain, heat, or cold. You may have a greater risk of getting burned.  Keep lights dim if bright lights bother you or make your headaches worse. Eating and drinking  Eat meals on a regular schedule.  If you drink alcohol: ? Limit how much you use to:  0-1 drink a day for women.  0-2 drinks a day for men. ? Be aware of how much alcohol is in your drink. In the [place] one drink equals one 12 oz bottle of beer (355 mL), one 5 oz glass of wine (148 mL), or one 1 oz glass of hard liquor  (44 mL).  Stop drinking caffeine, or reduce how much caffeine you drink. General instructions   Keep a journal to find out if certain things bring on headaches. For example, write down: ? What you eat and drink. ? How much sleep you get. ? Any change to your diet or medicines.  Get a massage or try other ways to relax.  Limit stress.  Sit up straight. Do not tighten (tense) your muscles.  Do not use any products that contain nicotine or tobacco. This includes cigarettes, e-cigarettes, and chewing tobacco. If you need help quitting, ask your doctor.  Exercise regularly as told by your doctor.  Get enough sleep. This often means 7-9 hours of sleep each night.  Keep all follow-up visits as told by your doctor. This is important. Contact a doctor if:  Your symptoms are not helped by medicine.  You have a headache that feels different than the other headaches.  You feel sick to your stomach (nauseous) or you throw up (vomit).  You have a fever. Get help right away if:  Your headache gets very bad quickly.  Your headache gets worse after a lot of physical activity.  You keep throwing up.  You have a stiff neck.  You have trouble seeing.  You have trouble speaking.  You have pain in  the eye or ear.  Your muscles are weak or you lose muscle control.  You lose your balance or have trouble walking.  You feel like you will pass out (faint) or you pass out.  You are mixed up (confused).  You have a seizure. Summary  A headache is pain or discomfort that is felt around the head or neck area.  There are many causes and types of headaches. In some cases, the cause may not be found.  Keep a journal to help find out what causes your headaches. Watch your condition for any changes. Let your doctor know about them.  Contact a doctor if you have a headache that is different from usual, or if your headache is not helped by medicine.  Get help right away if your headache  gets very bad, you throw up, you have trouble seeing, you lose your balance, or you have a seizure. This information is not intended to replace advice given to you by your health care provider. Make sure you discuss any questions you have with your health care provider. Document Revised: 01/11/2018 Document Reviewed: 01/11/2018 Elsevier Patient Education  [place].

## 2020-02-15 NOTE — MAU Provider Note (Signed)
History     [id number]  Arrival date and time: 02/15/20 1724    Chief Complaint  Patient presents with  . Back Pain  . Neck Pain  . Headache     HPI Michaela Williams is a 24 y.o. PPD #6 uncomplicated SVD with no significant PMHx, who presents for headache since delivery. Headache has been well controlled with dilaudid, while inpatient, and later percocet, after discharge. Stopped pain meds yesterday due to breastfeeding. Headache is improved by lying down, worse when sitting/standing. She rates this headache 10/10, endorses scotomas. No blurry vision. She continues to have neck stiffness as well as thoracic and lumbar back pain. Caffeine hasn't helped with her headache. She did not have issues with hypertension during pregnancy. No cp, sob, palpitations. No fever/chills.   Of note, patient had increased headache and lower extremity weakness post-delivery. Her lower extremity weakness has been stable since this time.  She is mostly sedentary at home and ambulates with a walker. MRI was obtained and demonstrated some epidural fluid surrounding the thecal sac with few small areas of blood accumulation. Anesthesia and neurosurgery were made aware of this, and no intervention was recommended. Patient has been completing PT for this issue since discharge.   Review of progress notes, discharge summary, imaging and labs from records. --/--/A POS (08/04 0030)  OB History    Gravida  2   Para  2   Term  2   Preterm  0   AB  0   Living  2     SAB  0   TAB  0   Ectopic  0   Multiple  0   Live Births  2           Past Medical History:  Diagnosis Date  . Anemia   . Asthma   . breast ca dx'd 05/2017   left  . Depression    no meds in 3 yrs  . Headache   . Infection    UTI  . Seizures ([place]) 2014   "seizure like activity", reaction to medicaiton, none since    Past Surgical History:  Procedure Laterality Date  . ESOPHAGOGASTRODUODENOSCOPY (EGD) WITH PROPOFOL N/A  06/03/2017   Procedure: ESOPHAGOGASTRODUODENOSCOPY (EGD) WITH PROPOFOL;  Surgeon: Ladene Artist, MD;  Location: [facility];  Service: Endoscopy;  Laterality: N/A;    Family History  Adopted: Yes  Problem Relation Age of Onset  . Mental illness Mother   . Cancer Mother   . Breast cancer Mother   . Cancer Maternal Grandmother   . Hypertension Maternal Grandmother   . Diabetes Maternal Grandmother   . Cancer Maternal Aunt   . Breast cancer Maternal Aunt   . Breast cancer Sister   . Breast cancer Maternal Aunt   . Breast cancer Maternal Aunt   . Breast cancer Maternal Aunt   . Breast cancer Maternal Aunt     Social History   Socioeconomic History  . Marital status: Single    Spouse name: Not on file  . Number of children: 1  . Years of education: Not on file  . Highest education level: Not on file  Occupational History  . Occupation: unemployed  Tobacco Use  . Smoking status: Former Smoker    Types: Cigarettes    Quit date: 12/19/2014    Years since quitting: 5.1  . Smokeless tobacco: Never Used  . Tobacco comment: age 22  Vaping Use  . Vaping Use: Former  Substance and Sexual  Activity  . Alcohol use: Not Currently    Comment: occasionally  . Drug use: No  . Sexual activity: Not Currently    Birth control/protection: None  Other Topics Concern  . Not on file  Social History Narrative  . Not on file   Social Determinants of Health   Financial Resource Strain:   . Difficulty of Paying Living Expenses:   Food Insecurity:   . Worried About Charity fundraiser in the Last Year:   . Arboriculturist in the Last Year:   Transportation Needs:   . Film/video editor (Medical):   Marland Kitchen Lack of Transportation (Non-Medical):   Physical Activity:   . Days of Exercise per Week:   . Minutes of Exercise per Session:   Stress:   . Feeling of Stress :   Social Connections:   . Frequency of Communication with Friends and Family:   . Frequency of Social Gatherings with  Friends and Family:   . Attends Religious Services:   . Active Member of Clubs or Organizations:   . Attends Archivist Meetings:   Marland Kitchen Marital Status:   Intimate Partner Violence:   . Fear of Current or Ex-Partner:   . Emotionally Abused:   Marland Kitchen Physically Abused:   . Sexually Abused:     Allergies  Allergen Reactions  . Flexeril [Cyclobenzaprine]     Patient had a near syncopal event following Flexeril PO in MAU 11/21/2019  . Lexapro [Escitalopram] Other (See Comments)    Didn't like how it made her feel  . Lithium Other (See Comments)    SEIZURES, Childhood reaction    No current facility-administered medications on file prior to encounter.   Current Outpatient Medications on File Prior to Encounter  Medication Sig Dispense Refill  . ibuprofen (ADVIL) 600 MG tablet Take 1 tablet (600 mg total) by mouth every 6 (six) hours. 30 tablet 0  . metaxalone (SKELAXIN) 800 MG tablet Take 1 tablet (800 mg total) by mouth 3 (three) times daily. 30 tablet 1  . oxyCODONE-acetaminophen (PERCOCET/ROXICET) 5-325 MG tablet Take 1-2 tablets by mouth every 6 (six) hours as needed. 12 tablet 0  . Prenatal Vit-Fe Fumarate-FA (PRENATAL PO) Take 1 tablet by mouth daily.     . sertraline (ZOLOFT) 25 MG tablet Take 1 tablet (25 mg total) by mouth daily. 30 tablet 2     ROS Pertinent positives and negative per HPI, all others reviewed and negative  Physical Exam   BP 124/79 (BP Location: Right Arm)   Pulse 85   Temp 98 F (36.7 C) (Oral)   Resp 19   SpO2 98% Comment: room air  Physical Exam Vitals and nursing note reviewed. Exam conducted with a chaperone present.  Constitutional:      General: She is not in acute distress.    Appearance: Normal appearance. She is normal weight.  HENT:     Head: Normocephalic and atraumatic.     Nose: Nose normal.     Mouth/Throat:     Mouth: Mucous membranes are moist.     Pharynx: Oropharynx is clear.  Eyes:     Extraocular Movements:  Extraocular movements intact.     Conjunctiva/sclera: Conjunctivae normal.  Cardiovascular:     Rate and Rhythm: Normal rate.     Pulses: Normal pulses.  Pulmonary:     Effort: Pulmonary effort is normal.  Musculoskeletal:        General: Normal range of motion.  Cervical back: Normal range of motion and neck supple.  Skin:    General: Skin is warm and dry.  Neurological:     General: No focal deficit present.     Mental Status: She is alert and oriented to person, place, and time. Mental status is at baseline.     Cranial Nerves: No cranial nerve deficit or dysarthria.     Motor: Weakness (5/5 strenght b/l UE, 4/5 strength b/l LE) present.  Psychiatric:        Mood and Affect: Mood normal.        Behavior: Behavior normal.     Labs No results found for this or any previous visit (from the past 24 hour(s)).  Imaging No results found.  MAU Course  Procedures Lab Orders  No laboratory test(s) ordered today   No orders of the defined types were placed in this encounter.  Imaging Orders  No imaging studies ordered today    MDM moderate  Assessment and Plan  24yo PPD#6 from uncomplicated SVD who presents to the MAU for evaluation of headache.   #Headache Patient presents with continued headache and neck stiffness since discharge.  She was previously taking Percocet every 8 hours with improvement in headache, but discontinued yesterday due to concern with breast-feeding.  Patient has also had decreased p.o. intake since discharge.  VSS.  Neurologic exam overall unremarkable.  Prior imaging reviewed and anesthesia and neurosurgery previously agreed that intervention would not be helpful. Suspect headache is most likely due to Percocet cessation versus dehydration.  Patient does not have a history of migraines, low suspicion for migraine at this time.  Low suspicion for dural headache.  Discussed this case with the anesthesiologist on-call, agreed that this is most likely  not due to her epidural given symptom onset and timing.  Advised that a blood patch would not be warranted at this time and the risks outweigh benefits.  Patient is given 1L LR, Reglan, Benadryl, Toradol with some improvement in her headache.  Patient encouraged to increase hydration, ensure she is eating frequent meals, take Tylenol and ibuprofen for her headache.  She is encouraged to use a heating pad, lidocaine patch, or perform neck stretches for her neck pain.  She is advised to continue physical therapy for her neck pain, back pain, and lower extremity weakness.  Dispo: Discharge home  Roxanne Gates, MD OB Fellow, [facility] 02/15/2020 6:58 PM

## 2020-02-20 ENCOUNTER — Ambulatory Visit (INDEPENDENT_AMBULATORY_CARE_PROVIDER_SITE_OTHER): Payer: Medicaid Other | Admitting: Licensed Clinical Social Worker

## 2020-02-20 DIAGNOSIS — F3181 Bipolar II disorder: Secondary | ICD-10-CM | POA: Diagnosis not present

## 2020-02-20 DIAGNOSIS — O99345 Other mental disorders complicating the puerperium: Secondary | ICD-10-CM

## 2020-02-20 NOTE — BH Specialist Note (Signed)
Integrated Behavioral Health via Telemedicine Video (Caregility) Visit  02/20/2020 Michaela Williams [id number]  Number of [place] visits: 1 Session Start time: 2:00pm Session End time: 2:37 pm Total time: 37 mins via mychart  Referring Provider: Hospital Discharge  Type of Visit: Video Patient/Family location: Home  [facility] Provider location: [facility]  All persons participating in visit: Pt. Michaela Williams and Michaela Williams   Discussed confidentiality: yes  I connected with Michaela Williams and/or Michaela Williams's n/a by a video enabled telemedicine application (Caregility) and verified that I am speaking with the correct person using two identifiers.    I discussed that engaging in this virtual visit, they consent to the provision of behavioral healthcare and the services will be billed under their insurance.   Patient and/or legal guardian expressed understanding and consented to virtual visit: yes  PRESENTING CONCERNS: Patient and/or family reports the following symptoms/concerns: History of depression.  Duration of problem: Since adolescence ; Severity of problem: Moderate   STRENGTHS (Protective Factors/Coping Skills): Family is supportive   GOALS ADDRESSED: Patient will: 1.  Reduce symptoms of: Depression   2.  Increase knowledge and/or ability of: implement coping skills to alleviate symptoms   3.  Demonstrate ability to: self manage symptoms   INTERVENTIONS: Interventions utilized: supportive counseling, and cbt  Standardized Assessments completed:    [employer] from 12/28/2019 in [place] for [place] at [facility]  PHQ-9 Total Score 25      ASSESSMENT: Patient currently experiencing major depression disorder. Michaela Williams is very tearful during today's mychart visit. Michaela Williams reports a history of depression and bipolar disorder. Michaela Williams report father of baby is supportive however she is overwhelmed with limited  mobility and parenting. Michaela Williams reports a history of self harm however she denies current risk of harm to self or others. Michaela Williams reports she is currently taking zoloft.   Patient may benefit from psychiatry   PLAN: 1. Follow up with behavioral health clinician on : two weeks mychart  2. Behavioral recommendations:  3. Referral(s): psychiatry   I discussed the assessment and treatment plan with the patient and/or parent/guardian. They were provided an opportunity to ask questions and all were answered. They agreed with the plan and demonstrated an understanding of the instructions.   They were advised to call back or seek an in-person evaluation if the symptoms worsen or if the condition fails to improve as anticipated.   Confirmed patient's address: no  Confirmed patient's phone number: no Any changes to demographics: no   Confirmed patient's insurance: no  Any changes to patient's insurance: no  I discussed the limitations of evaluation and management by telemedicine and the availability of in person appointments.  I discussed that the purpose of this visit is to provide behavioral health care while limiting exposure to the novel coronavirus.   Discussed there is a possibility of technology failure and discussed alternative modes of communication if that failure occurs.  Michaela Williams

## 2020-02-22 ENCOUNTER — Telehealth: Payer: Self-pay | Admitting: Family Medicine

## 2020-02-22 ENCOUNTER — Telehealth: Payer: Self-pay

## 2020-02-22 NOTE — Telephone Encounter (Signed)
S/w pt about irregular bleeding with IUD

## 2020-02-22 NOTE — Telephone Encounter (Signed)
Physical therapist called in to report pt is recently postpartum and not sleeping. She is experiencing dizziness, numbness on her left side after picking up her child. She reports her neck pain is at 4  Therapist states that pt is fine otherwise

## 2020-02-23 ENCOUNTER — Ambulatory Visit: Payer: 59 | Admitting: Clinical

## 2020-02-23 DIAGNOSIS — Z5329 Procedure and treatment not carried out because of patient's decision for other reasons: Secondary | ICD-10-CM

## 2020-02-23 DIAGNOSIS — Z91199 Patient's noncompliance with other medical treatment and regimen due to unspecified reason: Secondary | ICD-10-CM

## 2020-02-23 NOTE — BH Specialist Note (Signed)
Pt did not arrive to video visit and did not answer the phone ; "Call cannot be completed at this time", so unable to leave message; left MyChart message for patient.

## 2020-02-24 ENCOUNTER — Telehealth: Payer: Self-pay | Admitting: Family Medicine

## 2020-02-24 NOTE — Telephone Encounter (Signed)
FYI:Pt's mom called in stating her daughter was having trouble walking and was using a walker. She feels this is because of the epidural the pt got when she delievered her baby on 02/09/20. She was wanting an appointment with [place]. I reached out to [facility] and told Paula(pt's mom) to take her daughter to the ER-this can not wait. Pt also needs to f/u with her GYN.

## 2020-03-01 ENCOUNTER — Other Ambulatory Visit: Payer: Self-pay

## 2020-03-01 ENCOUNTER — Encounter: Payer: Self-pay | Admitting: Obstetrics and Gynecology

## 2020-03-01 ENCOUNTER — Ambulatory Visit (INDEPENDENT_AMBULATORY_CARE_PROVIDER_SITE_OTHER): Payer: Medicaid Other | Admitting: Obstetrics and Gynecology

## 2020-03-01 ENCOUNTER — Telehealth: Payer: Self-pay | Admitting: Family Medicine

## 2020-03-01 DIAGNOSIS — Z7409 Other reduced mobility: Secondary | ICD-10-CM

## 2020-03-01 NOTE — Telephone Encounter (Signed)
Copied from [place] [phone]. Topic: Quick Communication - Home Health Verbal Orders >> Mar 01, 2020  9:18 AM Jodie Echevaria wrote: Caller/Agency: Merry Proud / [place] Number: [phone] [place] to [facility] Requesting OT/PT/Skilled Nursing/Social Work/Speech Therapy: PT  Frequency: 2 w 2 and 1 w 2 to taper off

## 2020-03-01 NOTE — Telephone Encounter (Signed)
Called left a msg on machine given approval for the OT/PT/Skilled Nursing?social work/Speech therapy:PT for this patient if they had any further question give our office a call

## 2020-03-01 NOTE — Progress Notes (Addendum)
[place] Partum Visit Note  Michaela Williams is a 24 y.o. G67P2002 female who presents for a postpartum visit. She is 3 weeks postpartum following a normal spontaneous vaginal delivery.  I have fully reviewed the prenatal and intrapartum course. The delivery was at 40 gestational weeks.  Anesthesia: epidural. Postpartum course has been complicated due to left sided weakness from epidural. Patient has undergone physical therapy treatment with improvement in her weakness, however, it is not completely resolved. Baby is doing well. Baby is feeding by breast. Bleeding thin lochia. Bowel function is normal. Bladder function is normal. Patient is not sexually active. Contraception method is IUD. Postpartum depression screening: positive.     Edinburgh Postnatal Depression Scale - 03/01/20 1123      Edinburgh Postnatal Depression Scale:  In the Past 7 Days   I have been able to laugh and see the funny side of things. 1    I have looked forward with enjoyment to things. 3    I have blamed myself unnecessarily when things went wrong. 2    I have been anxious or worried for no good reason. 3    I have felt scared or panicky for no good reason. 2    Things have been getting on top of me. 1    I have been so unhappy that I have had difficulty sleeping. 3    I have felt sad or miserable. 3    I have been so unhappy that I have been crying. 1    The thought of harming myself has occurred to me. 3    Edinburgh Postnatal Depression Scale Total 22               Review of Systems Pertinent items noted in HPI and remainder of comprehensive ROS otherwise negative.    Objective:  Blood pressure 119/83, pulse 97, height 5\' 1"  (1.549 m), weight 137 lb (62.1 kg), currently breastfeeding.  General:  alert, cooperative and no distress   Breasts:  inspection negative, no nipple discharge or bleeding, no masses or nodularity palpable  Lungs: clear to auscultation bilaterally  Heart:  regular rate and rhythm    Abdomen: soft, non-tender; bowel sounds normal; no masses,  no organomegaly   Vulva:  normal  Vagina: Patient unable to tolerate exam  Cervix:    Corpus:   Adnexa:    Rectal Exam: Not performed.        Assessment:    Normal postpartum exam. Pap smear not done at today's visit.   Plan:   Essential components of care per ACOG recommendations:  1.  Mood and well being: Patient with positive depression screening today. Reviewed local resources for support.  - Patient currently on zoloft and closely followed bu behavioral health. Patient was previously on Abilify and plans to restart when she stops breastfeeding in 4 months. Patient is aware that she may need to discontinue breastfeeding for the benefit of her mental health. - Patient does not use tobacco.   2. Infant care and feeding:  -Patient currently breastmilk feeding? Yes Discussed return to work and pumping. If needed, patient was provided letter for work to allow for every 2-3 hr pumping breaks, and to be granted a private location to express breastmilk and refrigerated area to store breastmilk. Reviewed importance of draining breast regularly to support lactation.   3. Sexuality, contraception and birth spacing - Patient does not want a pregnancy in the next year.    4. Sleep and  fatigue -Encouraged family/partner/community support of 4 hrs of uninterrupted sleep to help with mood and fatigue  5. Physical Recovery  - Discussed patients delivery and complications - Patient is safe to resume physical and sexual activity  6.  Health Maintenance - Last pap smear done 07/2019 and was normal with negative HPV.  7. Chronic Disease - Patient with limited mobility s/p SVD with continued use of a walker - Pelvic ultrasound ordered to evaluate IUD location. Patient unable to tolerate exam  Mora Bellman, MD [facility]

## 2020-03-07 ENCOUNTER — Ambulatory Visit: Payer: 59 | Admitting: Women's Health

## 2020-03-09 ENCOUNTER — Other Ambulatory Visit: Payer: Self-pay | Admitting: Obstetrics and Gynecology

## 2020-03-09 DIAGNOSIS — Z92241 Personal history of systemic steroid therapy: Secondary | ICD-10-CM

## 2020-03-09 DIAGNOSIS — G8194 Hemiplegia, unspecified affecting left nondominant side: Secondary | ICD-10-CM

## 2020-03-13 ENCOUNTER — Ambulatory Visit (INDEPENDENT_AMBULATORY_CARE_PROVIDER_SITE_OTHER): Payer: Medicaid Other | Admitting: Licensed Clinical Social Worker

## 2020-03-13 ENCOUNTER — Other Ambulatory Visit: Payer: Self-pay | Admitting: Obstetrics and Gynecology

## 2020-03-13 ENCOUNTER — Ambulatory Visit
Admission: RE | Admit: 2020-03-13 | Discharge: 2020-03-13 | Disposition: A | Discharge status: Home / Self Care | Payer: Medicaid Other | Source: Ambulatory Visit | Attending: Obstetrics and Gynecology | Admitting: Obstetrics and Gynecology

## 2020-03-13 ENCOUNTER — Other Ambulatory Visit: Payer: Self-pay

## 2020-03-13 DIAGNOSIS — F3132 Bipolar disorder, current episode depressed, moderate: Secondary | ICD-10-CM

## 2020-03-13 DIAGNOSIS — Z30431 Encounter for routine checking of intrauterine contraceptive device: Secondary | ICD-10-CM | POA: Diagnosis not present

## 2020-03-13 NOTE — BH Specialist Note (Signed)
Integrated Behavioral Health via Telemedicine Video (Caregility) Visit  03/13/2020 Michaela Williams [id number]  Number of Integrated Behavioral Health visits: 2 Session Start time: 1:33pm  Session End time: 2:00pm Total time: 26 mins via mychart   Referring Provider: Hospital Discharge  Type of Service: Individual, Family, Etc. Patient/Family location: Verified pt is at home address Michaela Williams  [facility] Provider location: [place]  All persons participating in visit: Pt Michaela Williams and LCSWA A. Linton Rump   Discussed confidentiality: yes   I connected with Shawntia B Demos and/or Cindy B Ketchum's n/a by a video enabled telemedicine application (Caregility) and verified that I am speaking with the correct person using two identifiers.    I discussed that engaging in this virtual visit, they consent to the provision of behavioral healthcare and the services will be billed under their insurance.   Patient and/or legal guardian expressed understanding and consented to virtual visit: yes  PRESENTING CONCERNS: Patient and/or family reports the following symptoms/concerns: Bipolar disorder Duration of problem: History of behavioral health concerns going back since 2018 new symptoms occurred approx one week ago ; Severity of problem: mild   STRENGTHS (Protective Factors/Coping Skills): Supportive parents.   ASSESSMENT: Patient currently experiencing auditory hallucinations,thoughts of self harm (pt reports cutting however parents removed all sharp objects from patients residence.  Ms. Trigueros reports sleep deprivation an feeling numb and disconnected.     GOALS ADDRESSED: Patient will:  Reduce symptoms of: bipolar disorder   Increase knowledge and/or ability of: diagnosis and implement coping skills to alleviate symptoms    Demonstrate ability to: self manage symptoms   Progress of Goals:  INTERVENTIONS: Interventions utilized:  supportive counseling Standardized Assessments completed &  reviewed: edinburgh postnatal depression score 22   OUTCOME: Patient Response: responsive to recommendations    PLAN: Follow up with behavioral health clinician on : 3 weeks mychart  Behavioral recommendations: Continue taking prescribed medication as directed, follow up with psychiatry, prioritize sleep and communicate needs with parents and father of baby to prevent burnout.  Referral(s): Behavioral health   I discussed the assessment and treatment plan with the patient and/or parent/guardian. They were provided an opportunity to ask questions and all were answered. They agreed with the plan and demonstrated an understanding of the instructions.   They were advised to call back or seek an in-person evaluation if the symptoms worsen or if the condition fails to improve as anticipated.   Confirmed patient's address: yes Confirmed patient's phone number: yes Any changes to demographics: yes   Confirmed patient's insurance: no  Any changes to patient's insurance: no   I discussed the limitations of evaluation and management by telemedicine and the availability of in person appointments.  I discussed that the purpose of this visit is to provide behavioral health care while limiting exposure to the novel coronavirus.   Discussed there is a possibility of technology failure and discussed alternative modes of communication if that failure occurs.  Lynnea Ferrier

## 2020-03-15 ENCOUNTER — Ambulatory Visit (HOSPITAL_COMMUNITY)
Admission: RE | Admit: 2020-03-15 | Discharge: 2020-03-15 | Disposition: A | Discharge status: Home / Self Care | Payer: Medicaid Other | Source: Ambulatory Visit | Attending: Obstetrics and Gynecology | Admitting: Obstetrics and Gynecology

## 2020-03-15 ENCOUNTER — Other Ambulatory Visit: Payer: Self-pay

## 2020-03-15 DIAGNOSIS — S3992XA Unspecified injury of lower back, initial encounter: Secondary | ICD-10-CM | POA: Diagnosis not present

## 2020-03-15 DIAGNOSIS — Z7409 Other reduced mobility: Secondary | ICD-10-CM

## 2020-03-15 DIAGNOSIS — G8194 Hemiplegia, unspecified affecting left nondominant side: Secondary | ICD-10-CM

## 2020-03-15 DIAGNOSIS — M4184 Other forms of scoliosis, thoracic region: Secondary | ICD-10-CM | POA: Diagnosis not present

## 2020-03-15 DIAGNOSIS — M4050 Lordosis, unspecified, site unspecified: Secondary | ICD-10-CM | POA: Diagnosis not present

## 2020-03-15 MED ORDER — GADOBUTROL 1 MMOL/ML IV SOLN
7.0000 mL | Freq: Once | INTRAVENOUS | Status: AC | PRN
  Administered 2020-03-15: 7 mL via INTRAVENOUS

## 2020-03-26 ENCOUNTER — Other Ambulatory Visit: Payer: Self-pay

## 2020-03-26 ENCOUNTER — Encounter: Payer: Self-pay | Admitting: Family Medicine

## 2020-03-26 ENCOUNTER — Ambulatory Visit (INDEPENDENT_AMBULATORY_CARE_PROVIDER_SITE_OTHER): Payer: Medicaid Other | Admitting: Family Medicine

## 2020-03-26 VITALS — BP 118/83 | HR 82 | Temp 98.2°F | Ht 61.0 in | Wt 141.8 lb

## 2020-03-26 DIAGNOSIS — N6320 Unspecified lump in the left breast, unspecified quadrant: Secondary | ICD-10-CM | POA: Diagnosis not present

## 2020-03-26 DIAGNOSIS — Z803 Family history of malignant neoplasm of breast: Secondary | ICD-10-CM

## 2020-03-26 NOTE — Progress Notes (Signed)
9/20/20214:15 PM  Michaela Williams 1996-03-18, 24 y.o., female [id number]  Chief Complaint  Patient presents with  . Referral    needing another referral to get the lump in the breast checked, could not complete biopsy due to pregnancy    HPI:   Patient is a 24 y.o. female with past medical history significant for bipolar disoder who presents today for evaluation for left breast lump  She noticed a lump left breast about a year ago Was seeing Dr Kalman Shan in [place] and plan was for biopsy per [place] - [facility] However bx was postponed due to pregnancy Patent reports that she continues to feel lump She is breastfeeding SVD aug 4th 2021  Biological MGM, mother and 3 sisters with breast cancer, sisters diagnosed in late 20s/early 78s   Depression screen [facility] 2/9 12/28/2019 05/03/2019 04/05/2019  Decreased Interest 3 0 2  Down, Depressed, Hopeless 2 - 3  PHQ - 2 Score 5 0 5  Altered sleeping 3 - 3  Tired, decreased energy 3 - 3  Change in appetite 3 - 1  Feeling bad or failure about yourself  3 - 3  Trouble concentrating 3 - 3  Moving slowly or fidgety/restless 3 - 2  Suicidal thoughts 2 - 2  PHQ-9 Score 25 - 22  Difficult doing work/chores Very difficult - Extremely dIfficult    Fall Risk  05/03/2019 04/05/2019 12/22/2018 12/07/2018 01/06/2018  Falls in the past year? 0 0 0 0 No  Number falls in past yr: 0 0 0 0 -  Injury with Fall? 0 0 0 0 -  Risk for fall due to : Impaired balance/gait - - - -  Risk for fall due to: Comment has seizures - - - -  Follow up - - Falls evaluation completed Falls evaluation completed -     Allergies  Allergen Reactions  . Flexeril [Cyclobenzaprine]     Patient had a near syncopal event following Flexeril PO in MAU 11/21/2019  . Lexapro [Escitalopram] Other (See Comments)    Didn't like how it made her feel  . Lithium Other (See Comments)    SEIZURES, Childhood reaction    Prior to Admission medications   Medication Sig Start Date End Date  Taking? Authorizing Provider  ibuprofen (ADVIL) 600 MG tablet Take 1 tablet (600 mg total) by mouth every 6 (six) hours. 02/11/20  Yes Woodroe Mode, MD  metaxalone (SKELAXIN) 800 MG tablet Take 1 tablet (800 mg total) by mouth 3 (three) times daily. 02/11/20  Yes Woodroe Mode, MD  Prenatal Vit-Fe Fumarate-FA (PRENATAL PO) Take 1 tablet by mouth daily.    Yes [provider]  sertraline (ZOLOFT) 25 MG tablet Take 1 tablet (25 mg total) by mouth daily. 02/12/20  Yes Woodroe Mode, MD    Past Medical History:  Diagnosis Date  . Anemia   . Asthma   . breast ca dx'd 05/2017   left  . Depression    no meds in 3 yrs  . Headache   . Infection    UTI  . Seizures ([place]) 2014   "seizure like activity", reaction to medicaiton, none since    Past Surgical History:  Procedure Laterality Date  . ESOPHAGOGASTRODUODENOSCOPY (EGD) WITH PROPOFOL N/A 06/03/2017   Procedure: ESOPHAGOGASTRODUODENOSCOPY (EGD) WITH PROPOFOL;  Surgeon: Ladene Artist, MD;  Location: [facility];  Service: Endoscopy;  Laterality: N/A;    Social History   Tobacco Use  . Smoking status: Former Smoker  Types: Cigarettes    Quit date: 12/19/2014    Years since quitting: 5.2  . Smokeless tobacco: Never Used  . Tobacco comment: age 77  Substance Use Topics  . Alcohol use: Not Currently    Comment: occasionally    Family History  Adopted: Yes  Problem Relation Age of Onset  . Mental illness Mother   . Cancer Mother   . Breast cancer Mother   . Cancer Maternal Grandmother   . Hypertension Maternal Grandmother   . Diabetes Maternal Grandmother   . Cancer Maternal Aunt   . Breast cancer Maternal Aunt   . Breast cancer Sister   . Breast cancer Maternal Aunt   . Breast cancer Maternal Aunt   . Breast cancer Maternal Aunt   . Breast cancer Maternal Aunt     ROS Per hpi  OBJECTIVE:  Today's Vitals   03/26/20 1614  BP: 118/83  Pulse: 82  Temp: 98.2 F (36.8 C)  SpO2: 96%  Weight: 141 lb  12.8 oz (64.3 kg)  Height: 5\' 1"  (1.549 m)   Body mass index is 26.79 kg/m.   Physical Exam Vitals and nursing note reviewed. Exam conducted with a chaperone present.  Constitutional:      Appearance: She is well-developed.  HENT:     Head: Normocephalic and atraumatic.  Eyes:     General: No scleral icterus.    Conjunctiva/sclera: Conjunctivae normal.     Pupils: Pupils are equal, round, and reactive to light.  Pulmonary:     Effort: Pulmonary effort is normal.  Chest:     Breasts:        Right: Tenderness present. No mass, nipple discharge or skin change.        Left: Mass (1 oclock near areolar border, difficult to asses as patient was engorged) and tenderness present. No nipple discharge or skin change.  Musculoskeletal:     Cervical back: Neck supple.  Lymphadenopathy:     Upper Body:     Right upper body: No supraclavicular, axillary or pectoral adenopathy.     Left upper body: No supraclavicular, axillary or pectoral adenopathy.  Skin:    General: Skin is warm and dry.  Neurological:     Mental Status: She is alert and oriented to person, place, and time.     No results found for this or any previous visit (from the past 24 hour(s)).  No results found.   ASSESSMENT and PLAN  1. Left breast mass - US BREAST LTD UNI LEFT INC AXILLA; Future  2. FHx: breast cancer - Ambulatory referral to Genetics  No follow-ups on file.    Rutherford Guys, MD Primary [facility] at [facility] [place], [place] [address] Ph.  [phone] Fax [phone]

## 2020-03-26 NOTE — Patient Instructions (Signed)
° ° ° °  If you have lab work done today you will be contacted with your lab results within the next 2 weeks.  If you have not heard from us then please contact us. The fastest way to get your results is to register for My Chart. ° ° °IF you received an x-ray today, you will receive an invoice from [facility]. Please contact [facility] at [phone] with questions or concerns regarding your invoice.  ° °IF you received labwork today, you will receive an invoice from [facility]. Please contact [facility] at [phone] with questions or concerns regarding your invoice.  ° °Our billing staff will not be able to assist you with questions regarding bills from these companies. ° °You will be contacted with the lab results as soon as they are available. The fastest way to get your results is to activate your My Chart account. Instructions are located on the last page of this paperwork. If you have not heard from us regarding the results in 2 weeks, please contact this office. °  ° ° ° °

## 2020-03-28 ENCOUNTER — Telehealth: Payer: Self-pay | Admitting: Genetic Counselor

## 2020-03-28 NOTE — Telephone Encounter (Signed)
Received a genetic counseling referral from Dr. Pamella Pert for fhx of breast cancer. Pt has been cld and scheduled to see Santiago Glad on 10/18 at 2pm. Pt aware to arrive 15 minutes early.

## 2020-04-02 ENCOUNTER — Other Ambulatory Visit: Payer: Medicaid Other

## 2020-04-13 ENCOUNTER — Ambulatory Visit
Admission: RE | Admit: 2020-04-13 | Discharge: 2020-04-13 | Disposition: A | Discharge status: Home / Self Care | Payer: Medicaid Other | Source: Ambulatory Visit | Attending: Family Medicine | Admitting: Family Medicine

## 2020-04-13 ENCOUNTER — Other Ambulatory Visit: Payer: Self-pay

## 2020-04-13 DIAGNOSIS — N6320 Unspecified lump in the left breast, unspecified quadrant: Secondary | ICD-10-CM

## 2020-04-13 DIAGNOSIS — N6489 Other specified disorders of breast: Secondary | ICD-10-CM | POA: Diagnosis not present

## 2020-04-23 ENCOUNTER — Encounter: Payer: Self-pay | Admitting: Genetic Counselor

## 2020-04-23 ENCOUNTER — Inpatient Hospital Stay: Payer: Medicaid Other

## 2020-04-23 ENCOUNTER — Other Ambulatory Visit: Payer: Self-pay

## 2020-04-23 ENCOUNTER — Inpatient Hospital Stay: Payer: Medicaid Other | Attending: Genetic Counselor | Admitting: Genetic Counselor

## 2020-04-23 DIAGNOSIS — C50919 Malignant neoplasm of unspecified site of unspecified female breast: Secondary | ICD-10-CM | POA: Diagnosis not present

## 2020-04-23 DIAGNOSIS — Z803 Family history of malignant neoplasm of breast: Secondary | ICD-10-CM

## 2020-04-23 NOTE — Progress Notes (Signed)
REFERRING PROVIDER: Rutherford Guys, MD No address on file  PRIMARY PROVIDER:  Rutherford Guys, MD (Inactive)  PRIMARY REASON FOR VISIT:  1. Family history of breast cancer   2. Malignant neoplasm of female breast, unspecified estrogen receptor status, unspecified laterality, unspecified site of breast ([place])      HISTORY OF PRESENT ILLNESS:   [name], a 24 y.o. female, was seen for a [place] cancer genetics consultation at the request of Dr. Pamella Pert due to a personal and family history of breast cancer.  [name] presents to clinic today to discuss the possibility of a hereditary predisposition to cancer, genetic testing, and to further clarify her future cancer risks, as well as potential cancer risks for family members.   In November 2018, at the age of 35, [name] was diagnosed with breast cancer. She was seen in the [facility] system, and has reportedly tried to get her records transferred to [facility], but has not been successful.  The treatment plan included chemotherapy, however this reportedly stopped because sh became pregnant.  [name] delivered her baby about 2 months ago and reports having complications from the epidural that caused her not to be able to walk.  She has only started walking again within the last two weeks.  The patient reports that she has not had genetic testing in the past.      CANCER HISTORY:  Oncology History   No history exists.     RISK FACTORS:  Menarche was at age 70.  First live birth at age 70.  OCP use for approximately 2 years.  Ovaries intact: yes.  Hysterectomy: no.  Menopausal status: premenopausal.  HRT use: 0 years. Colonoscopy: yes; some polyps. Mammogram within the last year: yes. Number of breast biopsies: 1. Up to date with pelvic exams: yes. Any excessive radiation exposure in the past: no  Past Medical History:  Diagnosis Date  . Anemia   . Asthma   . breast ca dx'd 05/2017   left  . Breast cancer ([place])  05/2017   dx at age 59  . Depression    no meds in 3 yrs  . Family history of breast cancer   . Headache   . Infection    UTI  . Seizures ([place]) 2014   "seizure like activity", reaction to medicaiton, none since    Past Surgical History:  Procedure Laterality Date  . ESOPHAGOGASTRODUODENOSCOPY (EGD) WITH PROPOFOL N/A 06/03/2017   Procedure: ESOPHAGOGASTRODUODENOSCOPY (EGD) WITH PROPOFOL;  Surgeon: Ladene Artist, MD;  [facility];  Service: Endoscopy;  Laterality: N/A;    Social History   Socioeconomic History  . Marital status: Single    Spouse name: Not on file  . Number of children: 1  . Years of education: Not on file  . Highest education level: Not on file  Occupational History  . Occupation: unemployed  Tobacco Use  . Smoking status: Former Smoker    Types: Cigarettes    Quit date: 12/19/2014    Years since quitting: 5.3  . Smokeless tobacco: Never Used  . Tobacco comment: age 87  Vaping Use  . Vaping Use: Former  Substance and Sexual Activity  . Alcohol use: Not Currently    Comment: occasionally  . Drug use: No  . Sexual activity: Not Currently    Birth control/protection: None  Other Topics Concern  . Not on file  Social History Narrative  . Not on file   Social Determinants of Health  Financial Resource Strain:   . Difficulty of Paying Living Expenses: Not on file  Food Insecurity:   . Worried About Charity fundraiser in the Last Year: Not on file  . Ran Out of Food in the Last Year: Not on file  Transportation Needs:   . Lack of Transportation (Medical): Not on file  . Lack of Transportation (Non-Medical): Not on file  Physical Activity:   . Days of Exercise per Week: Not on file  . Minutes of Exercise per Session: Not on file  Stress:   . Feeling of Stress : Not on file  Social Connections:   . Frequency of Communication with Friends and Family: Not on file  . Frequency of Social Gatherings with Friends and Family: Not on file    . Attends Religious Services: Not on file  . Active Member of Clubs or Organizations: Not on file  . Attends Archivist Meetings: Not on file  . Marital Status: Not on file     FAMILY HISTORY:  We obtained a detailed, 4-generation family history.  Significant diagnoses are listed below: Family History  Adopted: Yes  Problem Relation Age of Onset  . Mental illness Mother   . Breast cancer Mother   . Hypertension Maternal Grandmother   . Diabetes Maternal Grandmother   . Breast cancer Maternal Grandmother        d. in her 3s, "dealing with breast cancer all her life"  . Breast cancer Maternal Aunt   . Cervical cancer Maternal Aunt   . Breast cancer Sister   . Breast cancer Maternal Aunt   . Breast cancer Maternal Aunt   . Breast cancer Maternal Aunt   . Breast cancer Maternal Aunt   . Breast cancer Half-Sister        dx in her late 107s  . Breast cancer Half-Sister        dx in her late 77s  . Cervical cancer Half-Sister   . Breast cancer Half-Sister        dx in her late 72s  . Leukemia Nephew 4       currently in his teens    The patient has a son and daughter who are healthy and cancer free.  She is adopted, but was adopted with a half brother.  All siblings are half siblings.  She is aware of a total of 4 brothers and up to 16 maternal half sisters.  Three sisters have been diagnosed with breast cancer, all in their late 20's.  One sister was also diagnosed with cervical cancer, and has a son who had a diagnosis of leukemia at age 77.  There is no information about the biological father, and limited information about the mother.  The patient's biological mother was diagnosed with breast cancer at an unknown age.  She has multiple siblings, two sisters had breast cancer and one of these had cervical cancer as well.  The maternal grandmother had breast cancer.  There is no information about the biological father.  [name] is unaware of previous family history of  genetic testing for hereditary cancer risks. Patient's maternal ancestors are of [place] African descent, and paternal ancestors are of unknown descent. There is no reported Ashkenazi Jewish ancestry. There is no known consanguinity.  GENETIC COUNSELING ASSESSMENT: Ms. Wooding is a 24 y.o. female with a personal and family history of breast cancer which is somewhat suggestive of a hereditary cancer syndrome and predisposition to cancer given the young  ages of onset and number of women in the family with breast cancer. We, therefore, discussed and recommended the following at today's visit.   DISCUSSION: We discussed that 5 - 10% of breast cancer is hereditary, with most cases associated with BRCA mutations.  However, about 4-7% of young breast cancer that is not explained by BRCA mutations can be due to TP53 mutations  There are other genes that can be associated with hereditary breast cancer syndromes.  These include ATM, CHEK2 and PALB2.  We discussed that testing is beneficial for several reasons including knowing how to follow individuals after completing their treatment, and understand if other family members could be at risk for cancer and allow them to undergo genetic testing.   We reviewed the characteristics, features and inheritance patterns of hereditary cancer syndromes. We also discussed genetic testing, including the appropriate family members to test, the process of testing, insurance coverage and turn-around-time for results. We discussed the implications of a negative, positive, carrier and/or variant of uncertain significant result. We recommended Ms. Glick pursue genetic testing for the CustomNext-Cancer+RNA gene panel. The CustomNext-Cancer gene panel offered by [facility] and includes sequencing and rearrangement analysis for the following 91 genes: AIP, ALK, APC*, ATM*, AXIN2, BAP1, BARD1, BLM, BMPR1A, BRCA1*, BRCA2*, BRIP1*, CDC73, CDH1*, CDK4, CDKN1B, CDKN2A, CHEK2*, CTNNA1, DICER1,  FANCC, FH, FLCN, GALNT12, KIF1B, LZTR1, MAX, MEN1, MET, MLH1*, MRE11A, MSH2*, MSH3, MSH6*, MUTYH*, NBN, NF1*, NF2, NTHL1, PALB2*, PHOX2B, PMS2*, POT1, PRKAR1A, PTCH1, PTEN*, RAD50, RAD51C*, RAD51D*, RB1, RECQL, RET, SDHA, SDHAF2, SDHB, SDHC, SDHD, SMAD4, SMARCA4, SMARCB1, SMARCE1, STK11, SUFU, TMEM127, TP53*, TSC1, TSC2, VHL and XRCC2 (sequencing and deletion/duplication); CASR, CFTR, CPA1, CTRC, EGFR, EGLN1, FAM175A, HOXB13, KIT, MITF, MLH3, PALLD, PDGFRA, POLD1, POLE, PRSS1, RINT1, RPS20, SPINK1 and TERT (sequencing only); EPCAM and GREM1 (deletion/duplication only). DNA and RNA analyses performed for * genes.   Based on Ms. Shean's personal and family history of cancer, she meets medical criteria for genetic testing. Despite that she meets criteria, she may still have an out of pocket cost. We discussed that if her out of pocket cost for testing is over $100, the laboratory will call and confirm whether she wants to proceed with testing.  If the out of pocket cost of testing is less than $100 she will be billed by the genetic testing laboratory.   In order to estimate her chance of having a BRCA mutation, we used statistical models (Penn II) that consider her personal medical history, family history and ancestry.  Because each model is different, there can be a lot of variability in the risks they give.  Therefore, these numbers must be considered a rough range and not a precise risk of having a BRCA mutation.  These models estimate that she has approximately a 39% (29% BRCA1 and 10% BRCA2)  chance of having a mutation. Based on this assessment of her family and personal history, genetic testing is recommended.    PLAN: After considering the risks, benefits, and limitations, Ms. Ethridge provided informed consent to pursue genetic testing and the blood sample was sent to [facility] for analysis of the CustomNext-Cancer+RNAinsight. Results should be available within approximately 2-3 weeks'  time, at which point they will be disclosed by telephone to Ms. Wheller, as will any additional recommendations warranted by these results. Ms. Jezek will receive a summary of her genetic counseling visit and a copy of her results once available. This information will also be available in Epic.   Lastly, we encouraged Ms.  Markham to remain in contact with cancer genetics annually so that we can continuously update the family history and inform her of any changes in cancer genetics and testing that may be of benefit for this family.   Ms. Mcauliff questions were answered to her satisfaction today. Our contact information was provided should additional questions or concerns arise. Thank you for the referral and allowing [place] to share in the care of your patient.   Ajamu Maxon P. Florene Glen, [place], [facility] Licensed, Insurance risk surveyor [email or web address] phone: [phone]  The patient was seen for a total of 45 minutes in face-to-face genetic counseling.  This patient was discussed with Drs. Magrinat, Lindi Adie and/or Burr Medico who agrees with the above.    _______________________________________________________________________ For Office Staff:  Number of people involved in session: 2 Was an Intern/ student involved with case: no

## 2020-04-24 ENCOUNTER — Telehealth: Payer: Self-pay | Admitting: Genetic Counselor

## 2020-04-24 NOTE — Telephone Encounter (Signed)
Patient reports being diagnosed with breast cancer in Nov 2018 after the birth of her son.  She started treatment and was being treated at [facility] in [place], when she had to stop treatment due to pregnancy. She would like to transfer her care to [facility].  I called her PCP office, they state that she needs to come in and sign a [facility] record release so that records can be transferred to [facility]. They could then provide a referral to the [facility].  Relayed this to patient. She will go into their office and ask for the form this week.

## 2020-04-25 ENCOUNTER — Other Ambulatory Visit: Payer: Self-pay

## 2020-04-25 ENCOUNTER — Ambulatory Visit (HOSPITAL_COMMUNITY): Payer: 59 | Admitting: Licensed Clinical Social Worker

## 2020-04-25 DIAGNOSIS — F3163 Bipolar disorder, current episode mixed, severe, without psychotic features: Secondary | ICD-10-CM
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
## 2020-05-11 ENCOUNTER — Other Ambulatory Visit: Payer: Self-pay

## 2020-05-11 ENCOUNTER — Ambulatory Visit (INDEPENDENT_AMBULATORY_CARE_PROVIDER_SITE_OTHER): Payer: 59 | Admitting: Licensed Clinical Social Worker

## 2020-05-11 DIAGNOSIS — F3163 Bipolar disorder, current episode mixed, severe, without psychotic features: Secondary | ICD-10-CM

## 2020-05-11 NOTE — Progress Notes (Signed)
THERAPIST PROGRESS NOTE   Virtual Visit via Video Note  I connected with Michaela Williams on 05/11/20 at  9:00 AM EDT by a video enabled telemedicine application and verified that I am speaking with the correct person using two identifiers.  Location: Patient: Home Provider: [facility]   I discussed the limitations of evaluation and management by telemedicine and the availability of in person appointments. The patient expressed understanding and agreed to proceed. I discussed the assessment and treatment plan with the patient. The patient was provided an opportunity to ask questions and all were answered. The patient agreed with the plan and demonstrated an understanding of the instructions.  I provided 55 minutes of non-face-to-face time during this encounter.  Participation Level: Active  Behavioral Response: CasualLethargicDepressed  Type of Therapy: Individual Therapy  Treatment Goals addressed: Communication: Dep/anx/coping  Interventions: Motivational Interviewing, Solution Focused and Supportive  Summary: Michaela Williams is a 24 y.o. female who presents with hx of Bipolar Dis. LCSW contacted pt yesterday to offer her this appt, which was a cancellation. This date pt signs on for video session with camera activated per clinician's request. Pt states she is still waking up and is somewhat lethargic. Pt states she is home with only her infant dtr whom she has placed in a moving swing to hopefully keep her entertained during session. Pt states baby is doing well. She has started the baby on formula which is helping child to sleep through the night. Pt does not have her son in the home during session. She provides additional details about 73 yr old son who is not yet talking. She reports other details of significant developmental delay not revealed prior. He is on the wait list for more intensive in home help than he is presently getting. She reports how stressful and out of control his  "tantrums" can get along with infant crying. Pt states overall her dtr is very different, seems on target developmentally and "is a delight", "makes me smile". Pt states she started on her Abilify ~ one wk ago. Boyfriend helped to watch her for first 48 hours. She had no reactions and believes she can already tell a difference in her mood for the better. Pt reports she worries when she is starting to feel better as she is fearful it could be a manic episode and not the meds. Pt states she copes best when she is busy. Tends to get restless, tearful with negative thoughts when more idol. LCSW assessed for SI. Pt states she has SI daily and has most of her life since age 1. LCSW assessed for current and recent SI, plan, intent. Pt states she has had 7 attempts since her dtr was born [birth year] 2020. Pt denies current plan. She states she called the crisis center LCSW told her about a few days ago when she was feeling suicidal. LCSW assessed for pt's amount of time alone. She states "a pretty fair amount". She also states boyfriend has been staying with her more as he is concerned for her well being and that of the children. Further exploration reveals pt saying she knows when she is starting to decline emotionally and may become suicidal. She states her parents live 3 min away and she first calls them if alone to get her children cared for. She states they are very accustomed to the situation. She says come and stay until pt is improved or is going to get herself admitted as needed. Discussed current need for  hospitalization. Pt denies need. She states boyfriend is going to stay with her more often until further results of starting meds can be factored in. LCSW reviewed and provided Suicide Hotline # pt did not have. She has [facility] # and is aware of 24/7 crisis walk in. Encouraged 911 if pt alone and moving to plan/intent. Pt verbalizes agreement. LCSW reviewed poc with pt's verbal agreement. Pt states appreciation for  care.  Suicidal/Homicidal: Yeswithout intent/plan  Therapist Response:   Plan: Return again in 4 weeks, which is next avail or walk in or crisis unit prn.  Diagnosis: Axis I: Bipolar, mixed    Axis II: Deferred  Hermine Messick, LCSW 05/11/2020

## 2020-05-21 ENCOUNTER — Telehealth: Payer: Self-pay | Admitting: Genetic Counselor

## 2020-05-21 ENCOUNTER — Ambulatory Visit: Payer: Self-pay | Admitting: Genetic Counselor

## 2020-05-21 DIAGNOSIS — Z1379 Encounter for other screening for genetic and chromosomal anomalies: Secondary | ICD-10-CM | POA: Insufficient documentation

## 2020-05-21 NOTE — Telephone Encounter (Signed)
Revealed negative genetic testing.  Discussed that we do not know why she has breast cancer or why there is cancer in the family. It could be due to a different gene that we are not testing, or maybe our current technology may not be able to pick something up.  It will be important for her to keep in contact with genetics to keep up with whether additional testing may be needed.  Two VUS' that should not change medical management.

## 2020-05-21 NOTE — Progress Notes (Signed)
HPI:  Michaela Williams was previously seen in the [facility] clinic due to a personal and family history of breast cancer and concerns regarding a hereditary predisposition to cancer. Please refer to our prior cancer genetics clinic note for more information regarding our discussion, assessment and recommendations, at the time. Michaela Williams recent genetic test results were disclosed to her, as were recommendations warranted by these results. These results and recommendations are discussed in more detail below.  CANCER HISTORY:  Oncology History   No history exists.    FAMILY HISTORY:  We obtained a detailed, 4-generation family history.  Significant diagnoses are listed below: Family History  Adopted: Yes  Problem Relation Age of Onset  . Mental illness Mother   . Breast cancer Mother   . Hypertension Maternal Grandmother   . Diabetes Maternal Grandmother   . Breast cancer Maternal Grandmother        d. in her 85s, "dealing with breast cancer all her life"  . Breast cancer Maternal Aunt   . Cervical cancer Maternal Aunt   . Breast cancer Sister   . Breast cancer Maternal Aunt   . Breast cancer Maternal Aunt   . Breast cancer Maternal Aunt   . Breast cancer Maternal Aunt   . Breast cancer Half-Sister        dx in her late 44s  . Breast cancer Half-Sister        dx in her late 19s  . Cervical cancer Half-Sister   . Breast cancer Half-Sister        dx in her late 21s  . Leukemia Nephew 4       currently in his teens    The patient has a son and daughter who are healthy and cancer free.  She is adopted, but was adopted with a half brother.  All siblings are half siblings.  She is aware of a total of 4 brothers and up to 16 maternal half sisters.  Three sisters have been diagnosed with breast cancer, all in their late 20's.  One sister was also diagnosed with cervical cancer, and has a son who had a diagnosis of leukemia at age 62.  There is no information about the biological  father, and limited information about the mother.  The patient's biological mother was diagnosed with breast cancer at an unknown age.  She has multiple siblings, two sisters had breast cancer and one of these had cervical cancer as well.  The maternal grandmother had breast cancer.  There is no information about the biological father.  Michaela Williams is unaware of previous family history of genetic testing for hereditary cancer risks. Patient's maternal ancestors are of [place] African descent, and paternal ancestors are of unknown descent. There is no reported Ashkenazi Jewish ancestry. There is no known consanguinity.  GENETIC TEST RESULTS: Genetic testing reported out on May 18, 2020 through the CustomNext-Cancer+RNAinsight cancer panel found no pathogenic mutations. The CustomNext-Cancer gene panel offered by [facility] and includes sequencing and rearrangement analysis for the following 91 genes: AIP, ALK, APC*, ATM*, AXIN2, BAP1, BARD1, BLM, BMPR1A, BRCA1*, BRCA2*, BRIP1*, CDC73, CDH1*, CDK4, CDKN1B, CDKN2A, CHEK2*, CTNNA1, DICER1, FANCC, FH, FLCN, GALNT12, KIF1B, LZTR1, MAX, MEN1, MET, MLH1*, MRE11A, MSH2*, MSH3, MSH6*, MUTYH*, NBN, NF1*, NF2, NTHL1, PALB2*, PHOX2B, PMS2*, POT1, PRKAR1A, PTCH1, PTEN*, RAD50, RAD51C*, RAD51D*, RB1, RECQL, RET, SDHA, SDHAF2, SDHB, SDHC, SDHD, SMAD4, SMARCA4, SMARCB1, SMARCE1, STK11, SUFU, TMEM127, TP53*, TSC1, TSC2, VHL and XRCC2 (sequencing and deletion/duplication); CASR, CFTR, CPA1, CTRC,  EGFR, EGLN1, FAM175A, HOXB13, KIT, MITF, MLH3, PALLD, PDGFRA, POLD1, POLE, PRSS1, RINT1, RPS20, SPINK1 and TERT (sequencing only); EPCAM and GREM1 (deletion/duplication only). DNA and RNA analyses performed for * genes. The test report has been scanned into EPIC and is located under the Molecular Pathology section of the Results Review tab.  A portion of the result report is included below for reference.     We discussed with Michaela Williams that because current genetic testing  is not perfect, it is possible there may be a gene mutation in one of these genes that current testing cannot detect, but that chance is small.  We also discussed, that there could be another gene that has not yet been discovered, or that we have not yet tested, that is responsible for the cancer diagnoses in the family. It is also possible there is a hereditary cause for the cancer in the family that Michaela Williams did not inherit and therefore was not identified in her testing.  Therefore, it is important to remain in touch with cancer genetics in the future so that we can continue to offer Michaela Williams the most up to date genetic testing.   Genetic testing did identify two Variants of uncertain significance (VUS) - one in the BAP1 gene called p.R518Q and a second in the RAD50 gene called p.S623F.  At this time, it is unknown if these variants are associated with increased cancer risk or if they are normal findings, but most variants such as these get reclassified to being inconsequential. They should not be used to make medical management decisions. With time, we suspect the lab will determine the significance of these variants, if any. If we do learn more about them, we will try to contact Michaela Williams to discuss it further. However, it is important to stay in touch with [place] periodically and keep the address and phone number up to date.  ADDITIONAL GENETIC TESTING: We discussed with Michaela Williams that her genetic testing was fairly extensive.  If there are genes identified to increase cancer risk that can be analyzed in the future, we would be happy to discuss and coordinate this testing at that time.    CANCER SCREENING RECOMMENDATIONS: Michaela Williams test result is considered negative (normal).  This means that we have not identified a hereditary cause for her personal and family history of breast cancer at this time. Most cancers happen by chance and this negative test suggests that her cancer may fall into this category.     While reassuring, this does not definitively rule out a hereditary predisposition to cancer. It is still possible that there could be genetic mutations that are undetectable by current technology. There could be genetic mutations in genes that have not been tested or identified to increase cancer risk.  Therefore, it is recommended she continue to follow the cancer management and screening guidelines provided by her primary healthcare provider.   An individual's cancer risk and medical management are not determined by genetic test results alone. Overall cancer risk assessment incorporates additional factors, including personal medical history, family history, and any available genetic information that may result in a personalized plan for cancer prevention and surveillance.  We discussed if she has received a referral to [facility] for her previous diagnosis of breast cancer.  Michaela Williams states that she has an appointment at her primary care office for the beginning of December to talk with a new PCP and sign the forms to obtain her [facility] records and transfer  her care.  RECOMMENDATIONS FOR FAMILY MEMBERS:  Individuals in this family might be at some increased risk of developing cancer, over the general population risk, simply due to the family history of cancer.  We recommended women in this family have a yearly mammogram beginning at age 39, or 67 years younger than the earliest onset of cancer, an annual clinical breast exam, and perform monthly breast self-exams. Women in this family should also have a gynecological exam as recommended by their primary provider. All family members should be referred for colonoscopy starting at age 59.  It is also possible there is a hereditary cause for the cancer in Michaela Williams family that she did not inherit and therefore was not identified in her.  Based on Michaela Williams's family history, we recommended her biological mother or sisters, who was diagnosed with breast cancer,  have genetic counseling and testing. Michaela Williams will let us know if we can be of any assistance in coordinating genetic counseling and/or testing for this family member.   FOLLOW-UP: Lastly, we discussed with Michaela Williams that cancer genetics is a rapidly advancing field and it is possible that new genetic tests will be appropriate for her and/or her family members in the future. We encouraged her to remain in contact with cancer genetics on an annual basis so we can update her personal and family histories and let her know of advances in cancer genetics that may benefit this family.   Our contact number was provided. Michaela Williams questions were answered to her satisfaction, and she knows she is welcome to call us at anytime with additional questions or concerns.   Roma Kayser, [place], [facility] Licensed, Certified Genetic Counselor [email or web address]

## 2020-05-21 NOTE — Telephone Encounter (Signed)
LM on VM that results are back and to please call. 

## 2020-05-23 ENCOUNTER — Encounter (HOSPITAL_COMMUNITY): Payer: Self-pay | Admitting: Psychiatry

## 2020-05-23 ENCOUNTER — Telehealth (INDEPENDENT_AMBULATORY_CARE_PROVIDER_SITE_OTHER): Payer: 59 | Admitting: Psychiatry

## 2020-05-23 ENCOUNTER — Other Ambulatory Visit: Payer: Self-pay

## 2020-05-23 DIAGNOSIS — F411 Generalized anxiety disorder: Secondary | ICD-10-CM

## 2020-05-23 DIAGNOSIS — F3181 Bipolar II disorder: Secondary | ICD-10-CM

## 2020-05-23 MED ORDER — ARIPIPRAZOLE (SENSOR) 10 MG PO TABS: 10.0000 mg | ORAL_TABLET | Freq: Every day | ORAL | 2 refills | Status: DC

## 2020-05-23 MED ORDER — FLUOXETINE HCL 10 MG PO CAPS: 10.0000 mg | ORAL_CAPSULE | Freq: Every day | ORAL | 2 refills | Status: DC

## 2020-05-23 MED ORDER — TRAZODONE HCL 50 MG PO TABS: 50.0000 mg | ORAL_TABLET | Freq: Every evening | ORAL | 2 refills | Status: DC | PRN
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
## 2020-06-13 ENCOUNTER — Other Ambulatory Visit: Payer: Self-pay

## 2020-06-13 ENCOUNTER — Ambulatory Visit (INDEPENDENT_AMBULATORY_CARE_PROVIDER_SITE_OTHER): Payer: 59 | Admitting: Licensed Clinical Social Worker

## 2020-06-13 DIAGNOSIS — F3162 Bipolar disorder, current episode mixed, moderate: Secondary | ICD-10-CM | POA: Diagnosis not present

## 2020-06-15 NOTE — Progress Notes (Signed)
THERAPIST PROGRESS NOTE   Virtual Visit via Video Note  I connected with Michaela Williams on 06/13/20 at  9:00 AM EST by a video enabled telemedicine application and verified that I am speaking with the correct person using two identifiers.  Location: Patient: Home Provider: [facility]   I discussed the limitations of evaluation and management by telemedicine and the availability of in person appointments. The patient expressed understanding and agreed to proceed. I discussed the assessment and treatment plan with the patient. The patient was provided an opportunity to ask questions and all were answered. The patient agreed with the plan and demonstrated an understanding of the instructions.  I provided 45 minutes of non-face-to-face time during this encounter.  Participation Level: Active  Behavioral Response: CasualAlertEuthymic  Type of Therapy: Individual Therapy  Treatment Goals addressed: Communication: Depression/Anxiety/Coping  Interventions: Motivational Interviewing and Supportive  Summary: Michaela Williams is a 24 y.o. female who presents with hx of Bipolar Dis/Dep/Anx. This date pt signs on for video session per her preference. Pt is well groomed, and presents in a very positive mood with smiling and laughter throughout session. Pt states how much help her medication has been to get her mood more regulated. Pt states "I needed this years ago". She reports she has had another med management appt with adjustments. Pt very pleased with outcomes. LCSW assessed for SI. Pt says "no problem in a few wks". She shares she can't remember a time in the last 5 years she has not thought of suicide daily. She expresses how wonderful this feels. Pt overall more functional. 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 LCSW provided support, encouragement for improvements. Pt states she and her boyfriend, Michaela Williams, are working toward finding a home where they can move in  together with all of their children. Michaela Williams has a 81 yr old son pt describes as "sweet" and "very well behaved". She advises her 70 yr old son is closer to getting the extra support he needs for his developmental delays when asked. Has appt end of Jan. Pt speaks of her sister (6 yrs younger) who works with children as being able to manage her son's behaviors and tantrums better than she does. She also reveals her father is a child psychologist and he too is good with her son. She feels she does a poor job with managing son's behaviors. Pt says some belittling comments about self. Introduced and educated pt on self talk. Self talk lit mailed to pt post session and will be addressed further after pt reads literature. Assessed for any new worries/concerns. Pt reports for the first time she helps to financially support and manage her parents household. She states both of her parents have always been "terrible with money". She recalls as a child multiple times they were in turmoil because of bills not being paid, disconnections, etc. Pt admits this is a big stressor on her yet she does not know how to address it. She advises she needs to be focusing on financial goals and goal for new housing for her own fam. She states "I've never been able to focus on me". Assisted pt to process this minimally as session nearing end of time. Pt would like support being able to set boundaries with parents. LCSW reviewed poc prior to close of session with pt's verbal agreement. Pt states appreciation for care.     Suicidal/Homicidal: Nowithout intent/plan  Therapist Response: Pt remains receptive  to care.  Plan: Return again in 2 weeks.  Diagnosis: Axis I: Bipolar, mixed    Axis II: Deferred  Hermine Messick, LCSW 06/15/2020

## 2020-06-18 ENCOUNTER — Other Ambulatory Visit: Payer: Medicaid Other

## 2020-06-18 ENCOUNTER — Other Ambulatory Visit: Payer: Self-pay

## 2020-06-18 DIAGNOSIS — Z20822 Contact with and (suspected) exposure to covid-19: Secondary | ICD-10-CM | POA: Diagnosis not present

## 2020-06-19 LAB — NOVEL CORONAVIRUS, NAA: SARS-CoV-2, NAA: NOT DETECTED

## 2020-06-19 LAB — SARS-COV-2, NAA 2 DAY TAT

## 2020-06-27 ENCOUNTER — Telehealth (HOSPITAL_COMMUNITY): Payer: Self-pay | Admitting: Licensed Clinical Social Worker

## 2020-06-27 ENCOUNTER — Other Ambulatory Visit: Payer: Self-pay

## 2020-06-27 ENCOUNTER — Ambulatory Visit (HOSPITAL_COMMUNITY): Payer: 59 | Admitting: Licensed Clinical Social Worker

## 2020-06-27 NOTE — Telephone Encounter (Signed)
LCSW sent text link for video session per schedule. Pt signs on for session yet she is located in [place] for the holidays. LCSW explained licensing regulations with inability to provide care to her while in [place]. Pt verbalizes understanding. LCSW provided info on next appt and poc for next session. Pt states appreciation.

## 2020-08-08 ENCOUNTER — Ambulatory Visit (INDEPENDENT_AMBULATORY_CARE_PROVIDER_SITE_OTHER): Payer: 59 | Admitting: Licensed Clinical Social Worker

## 2020-08-08 ENCOUNTER — Other Ambulatory Visit: Payer: Self-pay

## 2020-08-08 DIAGNOSIS — F3161 Bipolar disorder, current episode mixed, mild: Secondary | ICD-10-CM
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
## 2020-08-15 ENCOUNTER — Telehealth (INDEPENDENT_AMBULATORY_CARE_PROVIDER_SITE_OTHER): Payer: 59 | Admitting: Psychiatry

## 2020-08-15 ENCOUNTER — Other Ambulatory Visit: Payer: Self-pay

## 2020-08-15 ENCOUNTER — Encounter (HOSPITAL_COMMUNITY): Payer: Self-pay | Admitting: Psychiatry

## 2020-08-15 DIAGNOSIS — F3181 Bipolar II disorder: Secondary | ICD-10-CM

## 2020-08-15 DIAGNOSIS — F411 Generalized anxiety disorder: Secondary | ICD-10-CM

## 2020-08-15 MED ORDER — FLUOXETINE HCL 20 MG PO CAPS: 20.0000 mg | ORAL_CAPSULE | Freq: Every day | ORAL | 2 refills | Status: DC

## 2020-08-15 MED ORDER — ARIPIPRAZOLE 20 MG PO TABS: 20.0000 mg | ORAL_TABLET | Freq: Every day | ORAL | 2 refills | Status: DC

## 2020-08-15 MED ORDER — TRAZODONE HCL 100 MG PO TABS: 100.0000 mg | ORAL_TABLET | Freq: Every evening | ORAL | 2 refills | Status: DC | PRN

## 2020-08-15 NOTE — Progress Notes (Signed)
[place] MD/PA/NP OP Progress Note Virtual Visit via Telephone Note  I connected with Michaela Williams on 08/15/20 at  9:30 AM EST by telephone and verified that I am speaking with the correct person using two identifiers.  Location: Patient: home Provider: Clinic   I discussed the limitations, risks, security and privacy concerns of performing an evaluation and management service by telephone and the availability of in person appointments. I also discussed with the patient that there may be a patient responsible charge related to this service. The patient expressed understanding and agreed to proceed.   I provided 30 minutes of non-face-to-face time during this encounter.   08/15/2020 1:00 PM Michaela CROCKETT  MRN:  [id number]  Chief Complaint: "I have been taking two Abilify and two Prozac's"  HPI: 25 year old female seen today for follow up psychiatric evaluation.  She has a psychiatric history of bipolar 1 disorder, bipolar 2 disorder, adjustment disorder, anxiety, and depression.  She is currently managed on Abilify 10, Prozac 10 mg, and trazodone 25-50 mg daily.  She notes that her medications are somewhat effective in managing her psychiatric conditions.  Today she was unable to log on virtually so her exam was done over the phone.  On exam she was pleasant pleasant, cooperative, and engaged in conversation.  She informed provider that since her last visit she has been feeling better.  She notes that she has been taking 2 of her Abilify pills (20 mg total) and 2 of her (Prozac 20 mg total) for the last month.  She informed provider that her anxiety and depression has improved since doing this.  Provider conducted a GAD-7 and patient scored a 14, at her last visit she scored a 21.  Provider also conducted a PHQ-9 and patient scored a 13, at her last visit she scored a 22.  Today she denies SI/HI/VAH or paranoia.  Patient notes at times she has low energy and informed provider that she drinks coffee  or an energy drink.  She notes when she does this she exhibits manic-like behaviors.  She denies irritability, distractibility, grandiosity, or liable mood.  She notes that she sleeps approximately 4 to 6 hours a night noting that her infant daughter keeps her up at times.  She also informed provider that she is under a lot of stress because she and her fianc are trying to buy a house for them and their 3 children.  Patient agreeable to increasing Abilify 10 mg to 20 mg help manage mood.  She is also agreeable to increasing Prozac 10 mg to 20 mg help manage symptoms of anxiety and depression.  She will also increase Trazodone 50 mg to 50-100 mg as needed for sleep.  She will follow-up with outpatient counselor for therapy.  No other concerns noted at this time Visit Diagnosis:    ICD-10-CM   1. Bipolar 2 disorder, major depressive episode (HCC)  F31.81 FLUoxetine (PROZAC) 20 MG capsule    traZODone (DESYREL) 100 MG tablet    ARIPiprazole (ABILIFY) 20 MG tablet  2. Generalized anxiety disorder  F41.1 FLUoxetine (PROZAC) 20 MG capsule    Past Psychiatric History: bipolar 1 disorder, bipolar 2 disorder, adjustment disorder, anxiety, and depression.    Past Medical History:  Past Medical History:  Diagnosis Date  . Anemia   . Asthma   . breast ca dx'd 05/2017   left  . Breast cancer ([place]) 05/2017   dx at age 31  . Depression    no meds  in 3 yrs  . Family history of breast cancer   . Headache   . Infection    UTI  . Seizures ([place]) 2014   "seizure like activity", reaction to medicaiton, none since    Past Surgical History:  Procedure Laterality Date  . ESOPHAGOGASTRODUODENOSCOPY (EGD) WITH PROPOFOL N/A 06/03/2017   Procedure: ESOPHAGOGASTRODUODENOSCOPY (EGD) WITH PROPOFOL;  Surgeon: Ladene Artist, MD;  Location: [facility];  Service: Endoscopy;  Laterality: N/A;    Family Psychiatric History: Patient was adopted but noted that she was told that her biological mother may have had  bipolar disorder or schizophrenia  Family History:  Family History  Adopted: Yes  Problem Relation Age of Onset  . Mental illness Mother   . Breast cancer Mother   . Hypertension Maternal Grandmother   . Diabetes Maternal Grandmother   . Breast cancer Maternal Grandmother        d. in her 11s, "dealing with breast cancer all her life"  . Breast cancer Maternal Aunt   . Cervical cancer Maternal Aunt   . Breast cancer Sister   . Breast cancer Maternal Aunt   . Breast cancer Maternal Aunt   . Breast cancer Maternal Aunt   . Breast cancer Maternal Aunt   . Breast cancer Half-Sister        dx in her late 66s  . Breast cancer Half-Sister        dx in her late 67s  . Cervical cancer Half-Sister   . Breast cancer Half-Sister        dx in her late 25s  . Leukemia Nephew 4       currently in his teens    Social History:  Social History   Socioeconomic History  . Marital status: Single    Spouse name: Not on file  . Number of children: 1  . Years of education: Not on file  . Highest education level: Not on file  Occupational History  . Occupation: unemployed  Tobacco Use  . Smoking status: Former Smoker    Types: Cigarettes    Quit date: 12/19/2014    Years since quitting: 5.6  . Smokeless tobacco: Never Used  . Tobacco comment: age 16  Vaping Use  . Vaping Use: Former  Substance and Sexual Activity  . Alcohol use: Not Currently    Comment: occasionally  . Drug use: No  . Sexual activity: Not Currently    Birth control/protection: None  Other Topics Concern  . Not on file  Social History Narrative  . Not on file   Social Determinants of Health   Financial Resource Strain: Not on file  Food Insecurity: Not on file  Transportation Needs: Not on file  Physical Activity: Not on file  Stress: Not on file  Social Connections: Not on file    Allergies:  Allergies  Allergen Reactions  . Flexeril [Cyclobenzaprine]     Patient had a near syncopal event following  Flexeril PO in MAU 11/21/2019  . Lexapro [Escitalopram] Other (See Comments)    Didn't like how it made her feel  . Lithium Other (See Comments)    SEIZURES, Childhood reaction    Metabolic Disorder Labs: Lab Results  Component Value Date   HGBA1C 5.0 07/03/2017   MPG 96.8 07/03/2017   MPG 96.8 07/01/2017   Lab Results  Component Value Date   PROLACTIN 184.0 (H) 07/01/2017   Lab Results  Component Value Date   CHOL 196 07/03/2017   TRIG 45  07/03/2017   HDL 63 07/03/2017   CHOLHDL 3.1 07/03/2017   VLDL 9 07/03/2017   LDLCALC 124 (H) 07/03/2017   Lab Results  Component Value Date   TSH 1.650 12/07/2018   TSH 1.350 01/06/2018    Therapeutic Level Labs: No results found for: LITHIUM No results found for: VALPROATE No components found for:  CBMZ  Current Medications: Current Outpatient Medications  Medication Sig Dispense Refill  . ARIPiprazole (ABILIFY) 20 MG tablet Take 1 tablet (20 mg total) by mouth daily. 30 tablet 2  . FLUoxetine (PROZAC) 20 MG capsule Take 1 capsule (20 mg total) by mouth daily. 30 capsule 2  . metaxalone (SKELAXIN) 800 MG tablet Take 1 tablet (800 mg total) by mouth 3 (three) times daily. 30 tablet 1  . Prenatal Vit-Fe Fumarate-FA (PRENATAL PO) Take 1 tablet by mouth daily.     . traZODone (DESYREL) 100 MG tablet Take 1 tablet (100 mg total) by mouth at bedtime as needed for sleep. 30 tablet 2   No current facility-administered medications for this visit.     Musculoskeletal: Strength & Muscle Tone: Unable to assess due to telephone visit Gait & Station: Unable to assess due to telephone visit Patient leans: N/A  Psychiatric Specialty Exam: Review of Systems  currently breastfeeding.There is no height or weight on file to calculate BMI.  General Appearance: Unable to assess due to telephone visit  Eye Contact:  Unable to assess due to telephone visit  Speech:  Clear and Coherent and Normal Rate  Volume:  Normal  Mood:  Euthymic and  Notes she fells anxious and depressed at times but report the increae in Abilify and prozac helps  Affect:  Congruent and Non-Congruent  Thought Process:  Coherent, Goal Directed and Linear  Orientation:  Full (Time, Place, and Person)  Thought Content: WDL and Logical   Suicidal Thoughts:  No  Homicidal Thoughts:  No  Memory:  Immediate;   Good Recent;   Good Remote;   Good  Judgement:  Good  Insight:  Good  Psychomotor Activity:  Normal  Concentration:  Concentration: Good and Attention Span: Good  Recall:  Good  Fund of Knowledge: Good  Language: Good  Akathisia:  No  Handed:  Right  AIMS (if indicated):Not done  Assets:  Communication Skills Desire for Improvement Financial Resources/Insurance Housing Intimacy Social Support  ADL's:  Intact  Cognition: WNL  Sleep:  Fair   Screenings: AIMS   Flowsheet Row Admission (Discharged) from OP Visit from 07/01/2017 in [place] [facility]  AIMS Total Score 0    AUDIT   Flowsheet Row Admission (Discharged) from OP Visit from 07/01/2017 in [place] [facility]  Alcohol Use Disorder Identification Test Final Score (AUDIT) 1    GAD-7   Flowsheet Row Video Visit from 08/15/2020 in [facility] Video Visit from 05/23/2020 in [place] from 12/28/2019 in Center for [place] at [facility] Office Visit from 05/14/2017 in Primary [facility] at [facility]  Total GAD-7 Score 14 21 21 17     PHQ2-9   Flowsheet Row Video Visit from 08/15/2020 in [facility] Video Visit from 05/23/2020 in [place] from 12/28/2019 in [place] for [place] at [facility] Office Visit from 05/03/2019 in Primary Care at [place] from 04/05/2019 in [place] at [facility] Total  Score  2 5 5  0 5  PHQ-9 Total Score 13 22 25  - 22    Flowsheet Row Admission (Discharged) from 02/08/2020 in [place] from 12/28/2019 in Center for [place] at [facility]  C-SSRS RISK CATEGORY High Risk Low Risk       Assessment and Plan: Patient notes that her anxiety and depression has improved since her last visit.  She informed provider that she increased her Prozac and Abilify.  She also notes that she has difficulty sleeping.  She is agreeable to continuing Prozac 20 mg (which she increased herself from 10 mg).  She is also continuing Abilify 20 mg (which she increased herself from 10 mg).  She will increase trazodone 50 mg to 50 to 100 mg nightly as needed for sleep.  She will continue on the medication prescribed  1. Bipolar 2 disorder, major depressive episode (HCC)  Continue- FLUoxetine (PROZAC) 20 MG capsule; Take 1 capsule (20 mg total) by mouth daily.  Dispense: 30 capsule; Refill: 2 Increased- traZODone (DESYREL) 100 MG tablet; Take 1 tablet (100 mg total) by mouth at bedtime as needed for sleep.  Dispense: 30 tablet; Refill: 2 Continue- ARIPiprazole (ABILIFY) 20 MG tablet; Take 1 tablet (20 mg total) by mouth daily.  Dispense: 30 tablet; Refill: 2  2. Generalized anxiety disorder  Increased- FLUoxetine (PROZAC) 20 MG capsule; Take 1 capsule (20 mg total) by mouth daily.  Dispense: 30 capsule; Refill: 2  Follow-up in 3 months Follow-up with therapy Salley Slaughter, NP 08/15/2020, 1:00 PM

## 2020-08-20 ENCOUNTER — Other Ambulatory Visit: Payer: Self-pay

## 2020-08-20 ENCOUNTER — Ambulatory Visit (INDEPENDENT_AMBULATORY_CARE_PROVIDER_SITE_OTHER): Payer: Medicaid Other | Admitting: Advanced Practice Midwife

## 2020-08-20 ENCOUNTER — Encounter: Payer: Self-pay | Admitting: Advanced Practice Midwife

## 2020-08-20 VITALS — BP 115/79 | HR 97 | Ht 61.0 in | Wt 172.0 lb

## 2020-08-20 DIAGNOSIS — T839XXA Unspecified complication of genitourinary prosthetic device, implant and graft, initial encounter: Secondary | ICD-10-CM | POA: Diagnosis not present

## 2020-08-20 DIAGNOSIS — G8929 Other chronic pain: Secondary | ICD-10-CM | POA: Diagnosis not present

## 2020-08-20 DIAGNOSIS — Z3009 Encounter for other general counseling and advice on contraception: Secondary | ICD-10-CM | POA: Diagnosis not present

## 2020-08-20 DIAGNOSIS — M545 Low back pain, unspecified: Secondary | ICD-10-CM | POA: Diagnosis not present

## 2020-08-20 NOTE — Progress Notes (Addendum)
GYN presents for IUD removal due to weight gain and back pain 7/10 x 3 months.  IUD was inserted 02/2020.   Last PAP 08/05/2019.

## 2020-08-20 NOTE — Progress Notes (Signed)
    GYNECOLOGY PROGRESS NOTE  History:  25 y.o. [id number] presents to [place] office today for problem gyn visit. She reports back pain and weight gain with IUD. She had Liletta IUD placed 02/2020 while in patient after vaginal delivery.  She reports she desires a BTL today.  She denies h/a, dizziness, shortness of breath, n/v, or fever/chills.    The following portions of the patient's history were reviewed and updated as appropriate: allergies, current medications, past family history, past medical history, past social history, past surgical history and problem list. Last pap smear on 08/05/19 was normal.  Review of Systems:  Pertinent items are noted in HPI.   Objective:  Physical Exam Blood pressure 115/79, pulse 97, height 5\' 1"  (1.549 m), weight 172 lb (78 kg), currently breastfeeding. VS reviewed, nursing note reviewed,  Constitutional: well developed, well nourished, no distress HEENT: normocephalic CV: normal rate Pulm/chest wall: normal effort Breast Exam: deferred Abdomen: soft Neuro: alert and oriented x 3 Skin: warm, dry Psych: affect normal Pelvic exam: Deferred  Assessment & Plan:  1. Complication of intrauterine device (IUD), unspecified complication, initial encounter ([place]) --Pt reports back pain and weight gain. These may be related to IUD.  Pt desires removal but is concerned about options for birth control.  She reports desiring BTL and is sure she does not want more children. She has 2 and her fiance has another child so there are 3 children in their home.   --Discussed options, including removal of IUD today and another form of contraception started, leaving in IUD and scheduling preop counseling with MD, and doing outpatient [place] to see if IUD malpositioned and following up to consider replacing IUD.   --Pt desires BTL so scheduled with MD for follow up  2. Chronic midline low back pain without sciatica   3. Encounter for counseling regarding  contraception --Pt desires BTL, has tried LARCs Copy and IUD) and desires permanent birth control at this time.   4. Unwanted fertility --F/U with MD for preop for BTL  Fatima Blank, CNM 3:22 PM   Elvera Maria, CNM 08/20/2020 3:00 PM

## 2020-08-22 ENCOUNTER — Telehealth (INDEPENDENT_AMBULATORY_CARE_PROVIDER_SITE_OTHER): Payer: Medicaid Other | Admitting: Obstetrics and Gynecology

## 2020-08-22 ENCOUNTER — Encounter: Payer: Self-pay | Admitting: Obstetrics and Gynecology

## 2020-08-22 DIAGNOSIS — Z3009 Encounter for other general counseling and advice on contraception: Secondary | ICD-10-CM

## 2020-08-22 HISTORY — DX: Encounter for other general counseling and advice on contraception: Z30.09

## 2020-08-22 NOTE — Progress Notes (Signed)
Patient ID: Michaela Williams, female   DOB: 06/15/1996, 25 y.o.   MRN: [id number]   [place] VISIT ENCOUNTER NOTE  Provider location: Center for [place] at [place]   I connected with [name] on 08/22/20 at  1:45 PM EST by telephone at home and verified that I am speaking with the correct person using two identifiers. Patient was unable to do MyChart audiovisual encounter due to technical difficulties, she tried several times.    I discussed the limitations, risks, security and privacy concerns of performing an evaluation and management service by telephone and the availability of in person appointments. I also discussed with the patient that there may be a patient responsible charge related to this service. The patient expressed understanding and agreed to proceed.   History:  Michaela Williams is a 25 y.o. G33P2002 female being evaluated today for BTL and IUD removal. See prior office notes.    Past Medical History:  Diagnosis Date  . Anemia   . Asthma   . breast ca dx'd 05/2017   left  . Breast cancer ([place]) 05/2017   dx at age 26  . Depression    no meds in 3 yrs  . Family history of breast cancer   . Headache   . Infection    UTI  . Seizures ([place]) 2014   "seizure like activity", reaction to medicaiton, none since  . UGI bleed 06/02/2017   Past Surgical History:  Procedure Laterality Date  . ESOPHAGOGASTRODUODENOSCOPY (EGD) WITH PROPOFOL N/A 06/03/2017   Procedure: ESOPHAGOGASTRODUODENOSCOPY (EGD) WITH PROPOFOL;  Surgeon: Ladene Artist, MD;  [facility];  Service: Endoscopy;  Laterality: N/A;   The following portions of the patient's history were reviewed and updated as appropriate: allergies, current medications, past family history, past medical history, past social history, past surgical history and problem list.      Review of Systems:  Pertinent items noted in HPI and remainder of comprehensive ROS otherwise negative.  Physical  Exam:   General:  Alert, oriented and cooperative.   Mental Status: Normal mood and affect perceived. Normal judgment and thought content.  Physical exam deferred due to nature of the encounter  Labs and Imaging No results found for this or any previous visit (from the past 336 hour(s)). No results found.    Assessment and Plan:     1. Unwanted fertility Patient desires bilateral tubal sterilization.  Other reversible forms of contraception were discussed with patient; she declines all other modalities. Discussed bilateral tubal sterilization in detail; discussed options of laparoscopic bilateral tubal sterilization using Filshie clips vs laparoscopic bilateral salpingectomy. Risks and benefits discussed in detail including but not limited to: risk of regret, permanence of method, bleeding, infection, injury to surrounding organs and need for additional procedures.  Failure risk of 1-2 % for Filshie clips and <1% for bilateral salpingectomy with increased risk of ectopic gestation if pregnancy occurs was also discussed with patient.  Also discussed possible reduction of risk of ovarian cancer via bilateral salpingectomy given that a growing body of knowledge reveals that the majority of cases of high grade serous "ovarian" cancer actually are actually  cancers arising from the fimbriated end of the fallopian tubes. Emphasized that removal of fallopian tubes do not result in any known hormonal imbalance.  Patient verbalized understanding of these risks and benefits and wants to proceed with sterilization with laparoscopic bilateral salpingectomy and removal of IUD   She was told that she will be contacted  by our surgical scheduler regarding the time and date of her surgery; routine preoperative instructions of having nothing to eat or drink after midnight on the day prior to surgery and also coming to the hospital 1 1/2 hours prior to her time of surgery were also emphasized.  She was told she may be  called for a preoperative appointment about a week prior to surgery and will be given further preoperative instructions at that visit.  Routine postoperative instructions will be reviewed with the patient and her family in detail after surgery. Printed patient education handouts about the procedure was given to the patient to review at home.  Medicaid papers have been signed , patient understands that surgery will be scheduled at least 30 days after the day papers are signed as per Medicaid guidelines.  In the meantime, patient will use IUD for contraception prior to surgery.         I discussed the assessment and treatment plan with the patient. The patient was provided an opportunity to ask questions and all were answered. The patient agreed with the plan and demonstrated an understanding of the instructions.   The patient was advised to call back or seek an in-person evaluation/go to the ED if the symptoms worsen or if the condition fails to improve as anticipated.  I provided 8 minutes of non-face-to-face time during this encounter.   Chancy Milroy, MD Center for [place], [place]

## 2020-08-22 NOTE — Progress Notes (Signed)
Virtual GYN Visit "Consult"  Per notes pre-op for BTL.

## 2020-09-03 ENCOUNTER — Telehealth (HOSPITAL_COMMUNITY): Payer: Self-pay | Admitting: *Deleted

## 2020-09-03 NOTE — Telephone Encounter (Signed)
BC/BS [place] --HEALTHY BLUE HAS APPROVED ARIPiprazole (ABILIFY) 20 MG tablet QUANTITY:  30  EFFECTIVE:  09/03/20   THRU  09/03/21

## 2020-09-13 NOTE — H&P (Signed)
Michaela Williams is an 25 y.o. female, G2P2 who desires to have her IUD removed due to intolerance. She also desires BTL.    Menstrual History: Menarche age: 44 No LMP recorded. (Menstrual status: IUD).    Past Medical History:  Diagnosis Date  . Anemia   . Asthma   . breast ca dx'd 05/2017   left  . Breast cancer ([place]) 05/2017   dx at age 65  . Depression    no meds in 3 yrs  . Family history of breast cancer   . Headache   . Infection    UTI  . Seizures ([place]) 2014   "seizure like activity", reaction to medicaiton, none since  . UGI bleed 06/02/2017    Past Surgical History:  Procedure Laterality Date  . ESOPHAGOGASTRODUODENOSCOPY (EGD) WITH PROPOFOL N/A 06/03/2017   Procedure: ESOPHAGOGASTRODUODENOSCOPY (EGD) WITH PROPOFOL;  Surgeon: Ladene Artist, MD;  [facility];  Service: Endoscopy;  Laterality: N/A;    Family History  Adopted: Yes  Problem Relation Age of Onset  . Mental illness Mother   . Breast cancer Mother   . Hypertension Maternal Grandmother   . Diabetes Maternal Grandmother   . Breast cancer Maternal Grandmother        d. in her 73s, "dealing with breast cancer all her life"  . Breast cancer Maternal Aunt   . Cervical cancer Maternal Aunt   . Breast cancer Sister   . Breast cancer Maternal Aunt   . Breast cancer Maternal Aunt   . Breast cancer Maternal Aunt   . Breast cancer Maternal Aunt   . Breast cancer Half-Sister        dx in her late 54s  . Breast cancer Half-Sister        dx in her late 35s  . Cervical cancer Half-Sister   . Breast cancer Half-Sister        dx in her late 98s  . Leukemia Nephew 4       currently in his teens    Social History:  reports that she quit smoking about 5 years ago. Her smoking use included cigarettes. She has never used smokeless tobacco. She reports previous alcohol use. She reports that she does not use drugs.  Allergies:  Allergies  Allergen Reactions  . Flexeril [Cyclobenzaprine]      Patient had a near syncopal event following Flexeril PO in MAU 11/21/2019  . Lexapro [Escitalopram] Other (See Comments)    Didn't like how it made her feel  . Lithium Other (See Comments)    SEIZURES, Childhood reaction    No medications prior to admission.    Review of Systems  Constitutional: Negative.   Respiratory: Negative.   Cardiovascular: Negative.   Gastrointestinal: Negative.   Genitourinary: Negative.     currently breastfeeding. Physical Exam Constitutional:      Appearance: Normal appearance.  Cardiovascular:     Rate and Rhythm: Normal rate and regular rhythm.  Pulmonary:     Effort: Pulmonary effort is normal.     Breath sounds: Normal breath sounds.  Abdominal:     General: Bowel sounds are normal.     Palpations: Abdomen is soft.  Genitourinary:    Comments: Nl EGBUS, small mobile uterus, no masses Neurological:     Mental Status: She is alert.     No results found for this or any previous visit (from the past 24 hour(s)).  No results found.  Assessment/Plan: Unwanted fertility Removal of IUD  Patient desires bilateral tubal sterilization.  Other reversible forms of contraception were discussed with patient; she declines all other modalities. Discussed bilateral tubal sterilization in detail; discussed options of laparoscopic bilateral tubal sterilization using Filshie clips vs laparoscopic bilateral salpingectomy. Risks and benefits discussed in detail including but not limited to: risk of regret, permanence of method, bleeding, infection, injury to surrounding organs and need for additional procedures.  Failure risk of 1-2 % for Filshie clips and <1% for bilateral salpingectomy with increased risk of ectopic gestation if pregnancy occurs was also discussed with patient.  Also discussed possible reduction of risk of ovarian cancer via bilateral salpingectomy given that a growing body of knowledge reveals that the majority of cases of high grade serous  "ovarian" cancer actually are actually  cancers arising from the fimbriated end of the fallopian tubes. Emphasized that removal of fallopian tubes do not result in any known hormonal imbalance.  Patient verbalized understanding of these risks and benefits and wants to proceed with sterilization with laparoscopic bilateral salpingectomy and removal of IUD     Chancy Milroy 09/13/2020, 7:31 AM

## 2020-09-19 ENCOUNTER — Other Ambulatory Visit: Payer: Self-pay

## 2020-09-19 ENCOUNTER — Encounter (HOSPITAL_BASED_OUTPATIENT_CLINIC_OR_DEPARTMENT_OTHER): Payer: Self-pay | Admitting: Obstetrics and Gynecology

## 2020-09-24 ENCOUNTER — Other Ambulatory Visit (HOSPITAL_COMMUNITY)
Admission: RE | Admit: 2020-09-24 | Discharge: 2020-09-24 | Disposition: A | Discharge status: Home / Self Care | Payer: Medicaid Other | Source: Ambulatory Visit | Attending: Obstetrics and Gynecology | Admitting: Obstetrics and Gynecology

## 2020-09-24 ENCOUNTER — Encounter (HOSPITAL_BASED_OUTPATIENT_CLINIC_OR_DEPARTMENT_OTHER)
Admission: RE | Admit: 2020-09-24 | Discharge: 2020-09-24 | Disposition: A | Discharge status: Home / Self Care | Payer: Medicaid Other | Source: Ambulatory Visit | Attending: Obstetrics and Gynecology | Admitting: Obstetrics and Gynecology

## 2020-09-24 DIAGNOSIS — Z01812 Encounter for preprocedural laboratory examination: Secondary | ICD-10-CM | POA: Insufficient documentation

## 2020-09-24 DIAGNOSIS — Z20822 Contact with and (suspected) exposure to covid-19: Secondary | ICD-10-CM | POA: Diagnosis not present

## 2020-09-24 LAB — BASIC METABOLIC PANEL
Anion gap: 9 (ref 5–15)
BUN: 13 mg/dL (ref 6–20)
CO2: 24 mmol/L (ref 22–32)
Calcium: 9.3 mg/dL (ref 8.9–10.3)
Chloride: 103 mmol/L (ref 98–111)
Creatinine, Ser: 0.71 mg/dL (ref 0.44–1.00)
GFR, Estimated: >60 mL/min (ref >60)
Glucose, Bld: 108 mg/dL — ABNORMAL HIGH (ref 70–99)
Potassium: 4.3 mmol/L (ref 3.5–5.1)
Sodium: 136 mmol/L (ref 135–145)

## 2020-09-24 LAB — CBC
HCT: 44 % (ref 36.0–46.0)
Hemoglobin: 14.4 g/dL (ref 12.0–15.0)
MCH: 30.3 pg (ref 26.0–34.0)
MCHC: 32.7 g/dL (ref 30.0–36.0)
MCV: 92.6 fL (ref 80.0–100.0)
Platelets: 313 10*3/uL (ref 150–400)
RBC: 4.75 MIL/uL (ref 3.87–5.11)
RDW: 12.5 % (ref 11.5–15.5)
WBC: 7.4 10*3/uL (ref 4.0–10.5)
nRBC: 0 % (ref 0.0–0.2)

## 2020-09-24 LAB — SARS CORONAVIRUS 2 (TAT 6-24 HRS): SARS Coronavirus 2: NEGATIVE

## 2020-09-24 LAB — POCT PREGNANCY, URINE: Preg Test, Ur: NEGATIVE

## 2020-09-24 NOTE — Progress Notes (Signed)

## 2020-09-26 ENCOUNTER — Ambulatory Visit (HOSPITAL_BASED_OUTPATIENT_CLINIC_OR_DEPARTMENT_OTHER)
Admission: RE | Admit: 2020-09-26 | Discharge: 2020-09-26 | Disposition: A | Discharge status: Home / Self Care | Payer: Medicaid Other | Source: Ambulatory Visit | Attending: Obstetrics and Gynecology | Admitting: Obstetrics and Gynecology

## 2020-09-26 ENCOUNTER — Ambulatory Visit (HOSPITAL_BASED_OUTPATIENT_CLINIC_OR_DEPARTMENT_OTHER): Payer: Medicaid Other | Admitting: Anesthesiology

## 2020-09-26 ENCOUNTER — Other Ambulatory Visit: Payer: Self-pay

## 2020-09-26 ENCOUNTER — Encounter (HOSPITAL_BASED_OUTPATIENT_CLINIC_OR_DEPARTMENT_OTHER): Payer: Self-pay | Admitting: Obstetrics and Gynecology

## 2020-09-26 ENCOUNTER — Encounter (HOSPITAL_BASED_OUTPATIENT_CLINIC_OR_DEPARTMENT_OTHER)
Admission: RE | Disposition: A | Discharge status: Home / Self Care | Payer: Self-pay | Source: Ambulatory Visit | Attending: Obstetrics and Gynecology

## 2020-09-26 DIAGNOSIS — Z30432 Encounter for removal of intrauterine contraceptive device: Secondary | ICD-10-CM | POA: Diagnosis not present

## 2020-09-26 DIAGNOSIS — Z806 Family history of leukemia: Secondary | ICD-10-CM | POA: Diagnosis not present

## 2020-09-26 DIAGNOSIS — Z302 Encounter for sterilization: Secondary | ICD-10-CM | POA: Insufficient documentation

## 2020-09-26 DIAGNOSIS — Z87891 Personal history of nicotine dependence: Secondary | ICD-10-CM | POA: Diagnosis not present

## 2020-09-26 DIAGNOSIS — Z803 Family history of malignant neoplasm of breast: Secondary | ICD-10-CM | POA: Insufficient documentation

## 2020-09-26 DIAGNOSIS — Z853 Personal history of malignant neoplasm of breast: Secondary | ICD-10-CM | POA: Insufficient documentation

## 2020-09-26 DIAGNOSIS — Z888 Allergy status to other drugs, medicaments and biological substances status: Secondary | ICD-10-CM | POA: Insufficient documentation

## 2020-09-26 DIAGNOSIS — Z8049 Family history of malignant neoplasm of other genital organs: Secondary | ICD-10-CM | POA: Insufficient documentation

## 2020-09-26 DIAGNOSIS — Z8249 Family history of ischemic heart disease and other diseases of the circulatory system: Secondary | ICD-10-CM | POA: Insufficient documentation

## 2020-09-26 DIAGNOSIS — Z833 Family history of diabetes mellitus: Secondary | ICD-10-CM | POA: Diagnosis not present

## 2020-09-26 DIAGNOSIS — Z818 Family history of other mental and behavioral disorders: Secondary | ICD-10-CM | POA: Insufficient documentation

## 2020-09-26 HISTORY — PX: LAPAROSCOPIC BILATERAL SALPINGECTOMY: SHX5889

## 2020-09-26 HISTORY — PX: IUD REMOVAL: SHX5392

## 2020-09-26 HISTORY — DX: Anxiety disorder, unspecified: F41.9

## 2020-09-26 SURGERY — SALPINGECTOMY, BILATERAL, LAPAROSCOPIC
Anesthesia: General | Site: Uterus

## 2020-09-26 MED ORDER — AMISULPRIDE (ANTIEMETIC) 5 MG/2ML IV SOLN: 10.0000 mg | Freq: Once | INTRAVENOUS | Status: DC | PRN

## 2020-09-26 MED ORDER — PROPOFOL 10 MG/ML IV BOLUS
INTRAVENOUS | Status: DC | PRN
  Administered 2020-09-26: 180 mg via INTRAVENOUS

## 2020-09-26 MED ORDER — BUPIVACAINE HCL 0.5 % IJ SOLN
INTRAMUSCULAR | Status: DC | PRN
  Administered 2020-09-26: 30 mL

## 2020-09-26 MED ORDER — ROCURONIUM BROMIDE 100 MG/10ML IV SOLN
INTRAVENOUS | Status: DC | PRN
  Administered 2020-09-26: 80 mg via INTRAVENOUS

## 2020-09-26 MED ORDER — ONDANSETRON HCL 4 MG/2ML IJ SOLN
INTRAMUSCULAR | Status: DC | PRN
  Administered 2020-09-26: 4 mg via INTRAVENOUS

## 2020-09-26 MED ORDER — KETOROLAC TROMETHAMINE 15 MG/ML IJ SOLN
INTRAMUSCULAR | Status: AC
  Filled 2020-09-26: qty 1

## 2020-09-26 MED ORDER — FENTANYL CITRATE (PF) 100 MCG/2ML IJ SOLN
INTRAMUSCULAR | Status: DC | PRN
  Administered 2020-09-26: 50 ug via INTRAVENOUS
  Administered 2020-09-26: 100 ug via INTRAVENOUS

## 2020-09-26 MED ORDER — MIDAZOLAM HCL 5 MG/5ML IJ SOLN
INTRAMUSCULAR | Status: DC | PRN
  Administered 2020-09-26: 2 mg via INTRAVENOUS

## 2020-09-26 MED ORDER — ACETAMINOPHEN 500 MG PO TABS
ORAL_TABLET | ORAL | Status: AC
  Filled 2020-09-26: qty 2

## 2020-09-26 MED ORDER — OXYCODONE HCL 5 MG PO TABS: 5.0000 mg | ORAL_TABLET | Freq: Once | ORAL | Status: DC | PRN

## 2020-09-26 MED ORDER — MEPERIDINE HCL 25 MG/ML IJ SOLN: 6.2500 mg | INTRAMUSCULAR | Status: DC | PRN

## 2020-09-26 MED ORDER — OXYCODONE HCL 5 MG PO TABS: 5.0000 mg | ORAL_TABLET | Freq: Four times a day (QID) | ORAL | 0 refills | Status: DC | PRN

## 2020-09-26 MED ORDER — LIDOCAINE HCL (CARDIAC) PF 100 MG/5ML IV SOSY
PREFILLED_SYRINGE | INTRAVENOUS | Status: DC | PRN
  Administered 2020-09-26: 80 mg via INTRAVENOUS

## 2020-09-26 MED ORDER — LACTATED RINGERS IV SOLN: INTRAVENOUS | Status: DC

## 2020-09-26 MED ORDER — POVIDONE-IODINE 10 % EX SWAB
2.0000 "application " | Freq: Once | CUTANEOUS | Status: AC
  Administered 2020-09-26: 2 via TOPICAL

## 2020-09-26 MED ORDER — PROMETHAZINE HCL 25 MG/ML IJ SOLN: 6.2500 mg | INTRAMUSCULAR | Status: DC | PRN

## 2020-09-26 MED ORDER — KETOROLAC TROMETHAMINE 15 MG/ML IJ SOLN
15.0000 mg | INTRAMUSCULAR | Status: AC
  Administered 2020-09-26: 15 mg via INTRAVENOUS

## 2020-09-26 MED ORDER — KETOROLAC TROMETHAMINE 30 MG/ML IJ SOLN
INTRAMUSCULAR | Status: AC
  Filled 2020-09-26: qty 1

## 2020-09-26 MED ORDER — OXYCODONE HCL 5 MG/5ML PO SOLN
5.0000 mg | Freq: Once | ORAL | Status: DC | PRN
Start: 2020-09-26 — End: 2020-09-26

## 2020-09-26 MED ORDER — FENTANYL CITRATE (PF) 100 MCG/2ML IJ SOLN
INTRAMUSCULAR | Status: AC
  Filled 2020-09-26: qty 2

## 2020-09-26 MED ORDER — LIDOCAINE 2% (20 MG/ML) 5 ML SYRINGE
INTRAMUSCULAR | Status: AC
  Filled 2020-09-26: qty 5

## 2020-09-26 MED ORDER — HYDROMORPHONE HCL 1 MG/ML IJ SOLN: 0.2500 mg | INTRAMUSCULAR | Status: DC | PRN

## 2020-09-26 MED ORDER — SOD CITRATE-CITRIC ACID 500-334 MG/5ML PO SOLN
ORAL | Status: AC
  Filled 2020-09-26: qty 15

## 2020-09-26 MED ORDER — MIDAZOLAM HCL 2 MG/2ML IJ SOLN
INTRAMUSCULAR | Status: AC
  Filled 2020-09-26: qty 2

## 2020-09-26 MED ORDER — IBUPROFEN 800 MG PO TABS: 800.0000 mg | ORAL_TABLET | Freq: Three times a day (TID) | ORAL | 0 refills | Status: DC | PRN

## 2020-09-26 MED ORDER — SUGAMMADEX SODIUM 200 MG/2ML IV SOLN
INTRAVENOUS | Status: DC | PRN
  Administered 2020-09-26: 200 mg via INTRAVENOUS

## 2020-09-26 MED ORDER — ACETAMINOPHEN 500 MG PO TABS
1000.0000 mg | ORAL_TABLET | ORAL | Status: AC
  Administered 2020-09-26: 1000 mg via ORAL

## 2020-09-26 MED ORDER — CEFAZOLIN SODIUM-DEXTROSE 2-4 GM/100ML-% IV SOLN
INTRAVENOUS | Status: AC
  Filled 2020-09-26: qty 100

## 2020-09-26 MED ORDER — DEXAMETHASONE SODIUM PHOSPHATE 4 MG/ML IJ SOLN
INTRAMUSCULAR | Status: DC | PRN
  Administered 2020-09-26: 4 mg via INTRAVENOUS

## 2020-09-26 MED ORDER — ONDANSETRON HCL 4 MG/2ML IJ SOLN
INTRAMUSCULAR | Status: AC
  Filled 2020-09-26: qty 2

## 2020-09-26 MED ORDER — ROCURONIUM BROMIDE 10 MG/ML (PF) SYRINGE
PREFILLED_SYRINGE | INTRAVENOUS | Status: AC
  Filled 2020-09-26: qty 10

## 2020-09-26 MED ORDER — SOD CITRATE-CITRIC ACID 500-334 MG/5ML PO SOLN
30.0000 mL | ORAL | Status: AC
  Administered 2020-09-26: 30 mL via ORAL

## 2020-09-26 MED ORDER — PHENYLEPHRINE HCL (PRESSORS) 10 MG/ML IV SOLN
INTRAVENOUS | Status: DC | PRN
  Administered 2020-09-26 (×2): 80 ug via INTRAVENOUS

## 2020-09-26 MED ORDER — KETOROLAC TROMETHAMINE 15 MG/ML IJ SOLN
INTRAMUSCULAR | Status: DC | PRN
  Administered 2020-09-26: 15 mg via INTRAVENOUS

## 2020-09-26 MED ORDER — EPHEDRINE SULFATE 50 MG/ML IJ SOLN
INTRAMUSCULAR | Status: DC | PRN
  Administered 2020-09-26: 10 mg via INTRAVENOUS

## 2020-09-26 SURGICAL SUPPLY — 29 items
ADH SKN CLS APL DERMABOND .7 (GAUZE/BANDAGES/DRESSINGS) ×3
CATH ROBINSON RED A/P 16FR (CATHETERS) IMPLANT
DERMABOND ADVANCED (GAUZE/BANDAGES/DRESSINGS) ×1
DERMABOND ADVANCED .7 DNX12 (GAUZE/BANDAGES/DRESSINGS) ×3 IMPLANT
DRSG OPSITE POSTOP 3X4 (GAUZE/BANDAGES/DRESSINGS) ×4 IMPLANT
DURAPREP 26ML APPLICATOR (WOUND CARE) ×4 IMPLANT
GAUZE 4X4 16PLY RFD (DISPOSABLE) ×4 IMPLANT
GLOVE SRG 8 PF TXTR STRL LF DI (GLOVE) ×3 IMPLANT
GLOVE SURG ENC MOIS LTX SZ7.5 (GLOVE) ×4 IMPLANT
GLOVE SURG UNDER POLY LF SZ7 (GLOVE) ×8 IMPLANT
GLOVE SURG UNDER POLY LF SZ8 (GLOVE) ×4
GOWN STRL REUS W/TWL LRG LVL3 (GOWN DISPOSABLE) ×8 IMPLANT
GOWN STRL REUS W/TWL XL LVL3 (GOWN DISPOSABLE) ×4 IMPLANT
NS IRRIG 1000ML POUR BTL (IV SOLUTION) ×4 IMPLANT
PACK LAPAROSCOPY BASIN (CUSTOM PROCEDURE TRAY) ×4 IMPLANT
PACK TRENDGUARD 450 HYBRID PRO (MISCELLANEOUS) ×3 IMPLANT
PAD OB MATERNITY 4.3X12.25 (PERSONAL CARE ITEMS) ×4 IMPLANT
PAD PREP 24X48 CUFFED NSTRL (MISCELLANEOUS) ×4 IMPLANT
SET TUBE SMOKE EVAC HIGH FLOW (TUBING) ×4 IMPLANT
SHEARS HARMONIC ACE PLUS 36CM (ENDOMECHANICALS) ×4 IMPLANT
SLEEVE SCD COMPRESS KNEE MED (STOCKING) ×4 IMPLANT
SLEEVE XCEL OPT CAN 5 100 (ENDOMECHANICALS) ×4 IMPLANT
SUT MNCRL AB 4-0 PS2 18 (SUTURE) ×8 IMPLANT
SUT VICRYL 0 UR6 27IN ABS (SUTURE) ×8 IMPLANT
TOWEL GREEN STERILE FF (TOWEL DISPOSABLE) ×8 IMPLANT
TRENDGUARD 450 HYBRID PRO PACK (MISCELLANEOUS) ×4
TROCAR BALLN 12MMX100 BLUNT (TROCAR) ×4 IMPLANT
TROCAR XCEL NON-BLD 5MMX100MML (ENDOMECHANICALS) ×4 IMPLANT
WARMER LAPAROSCOPE (MISCELLANEOUS) ×4 IMPLANT

## 2020-09-26 NOTE — Anesthesia Preprocedure Evaluation (Signed)
Anesthesia Evaluation  Patient identified by MRN, date of birth, ID band Patient awake    Reviewed: Allergy & Precautions, NPO status , Patient's Chart, lab work & pertinent test results  Airway Mallampati: II  TM Distance: >3 FB Neck ROM: Full    Dental no notable dental hx. (+) Teeth Intact, Dental Advisory Given   Pulmonary asthma , former smoker,    Pulmonary exam normal breath sounds clear to auscultation       Cardiovascular Exercise Tolerance: Good negative cardio ROS Normal cardiovascular exam Rhythm:Regular Rate:Normal     Neuro/Psych  Headaches, Anxiety Depression Bipolar Disorder    GI/Hepatic negative GI ROS, Neg liver ROS,   Endo/Other  negative endocrine ROS  Renal/GU negative Renal ROS     Musculoskeletal negative musculoskeletal ROS (+)   Abdominal   Peds  Hematology negative hematology ROS (+) Lab Results      Component                Value               Date                      WBC                      11.3 (H)            02/08/2020                HGB                      10.5 (L)            02/08/2020                HCT                      33.8 (L)            02/08/2020                MCV                      87.6                02/08/2020                PLT                      269                 02/08/2020              Anesthesia Other Findings   Reproductive/Obstetrics                             Anesthesia Physical  Anesthesia Plan  ASA: II  Anesthesia Plan: General   Post-op Pain Management:    Induction: Intravenous  PONV Risk Score and Plan: 3 and Ondansetron, Dexamethasone, Midazolam and Treatment may vary due to age or medical condition  Airway Management Planned: Oral ETT  Additional Equipment:   Intra-op Plan:   Post-operative Plan: Extubation in OR  Informed Consent: I have reviewed the patients History and Physical, chart, labs and  discussed the procedure including the risks, benefits and alternatives for the proposed anesthesia with the patient or authorized representative who  has indicated his/her understanding and acceptance.     Dental advisory given  Plan Discussed with: CRNA  Anesthesia Plan Comments:         Anesthesia Quick Evaluation

## 2020-09-26 NOTE — Discharge Instructions (Signed)
Salpingectomy, Care After This sheet gives you information about how to care for yourself after your procedure. Your health care provider may also give you more specific instructions. If you have problems or questions, contact your health care provider. What can I expect after the procedure? After the procedure, it is common to have:  Pain in your abdomen.  Some light vaginal bleeding (spotting) for a few days.  Tiredness. Your recovery time will vary depending on which method your surgeon used for your surgery. Follow these instructions at home: Incision care  Follow instructions from your health care provider about how to take care of your incisions. Make sure you: ? Wash your hands with soap and water before and after you change your bandage (dressing). If soap and water are not available, use hand sanitizer. ? Change or remove your dressing as told by your health care provider. ? Leave any stitches (sutures), skin glue, or adhesive strips in place. These skin closures may need to stay in place for 2 weeks or longer. If adhesive strip edges start to loosen and curl up, you may trim the loose edges. Do not remove adhesive strips completely unless your health care provider tells you to do that.  Keep your dressing clean and dry.  Check your incision area every day for signs of infection. Check for: ? Redness, swelling, or pain that gets worse. ? Fluid or blood. ? Warmth. ? Pus or a bad smell.   Activity  Rest as told by your health care provider.  Avoid sitting for a long time without moving. Get up to take short walks every 1-2 hours. This is important to improve blood flow and breathing. Ask for help if you feel weak or unsteady.  Return to your normal activities as told by your health care provider. Ask your health care provider what activities are safe for you.  Do not drive until your health care provider says that it is safe.  Do not lift anything that is heavier than 10 lb  (4.5 kg), or the limit that you are told, until your health care provider says that it is safe. This may be 2-6 weeks depending on your surgery.  Until your health care provider approves: ? Do not douche. ? Do not use tampons. ? Do not have sex. Medicines  Take over-the-counter and prescription medicines only as told by your health care provider.  Ask your health care provider if the medicine prescribed to you: ? Requires you to avoid driving or using heavy machinery. ? Can cause constipation. You may need to take actions to prevent or treat constipation, such as:  Drink enough fluid to keep your urine pale yellow.  Take over-the-counter or prescription medicines.  Eat foods that are high in fiber, such as beans, whole grains, and fresh fruits and vegetables.  Limit foods that are high in fat and processed sugars, such as fried or sweet foods. General instructions  Wear compression stockings as told by your health care provider. These stockings help to prevent blood clots and reduce swelling in your legs.  Do not use any products that contain nicotine or tobacco, such as cigarettes, e-cigarettes, and chewing tobacco. If you need help quitting, ask your health care provider.  Do not take baths, swim, or use a hot tub until your health care provider approves. You may take showers.  Keep all follow-up visits as told by your health care provider. This is important. Contact a health care provider if you have:  Pain when you urinate.  Redness, swelling, or pain around an incision.  Fluid or blood coming from an incision.  Pus or a bad smell coming from an incision.  An incision that feels warm to the touch.  A fever.  Abdominal pain that gets worse or does not get better with medicine.  An incision that starts to break open.  A rash.  Light-headedness.  Nausea and vomiting. Get help right away if you:  Have pain in your chest or leg.  Develop shortness of  breath.  Faint.  Have increased or heavy vaginal bleeding, such as soaking a pad in an hour. Summary  After the procedure, it is common to feel tired, have some pain in your abdomen, and have some light vaginal bleeding for a few days.  Follow instructions from your health care provider about how to take care of your incisions.  Return to your normal activities as told by your health care provider. Ask your health care provider what activities are safe for you.  Do not douche, use tampons, or have sex until your health care provider approves.  Keep all follow-up visits as told by your health care provider. This information is not intended to replace advice given to you by your health care provider. Make sure you discuss any questions you have with your health care provider. Document Revised: 06/14/2018 Document Reviewed: 06/14/2018 Elsevier Patient Education  2021 [employer].  *No Ibuprofen until after 9pm *No Tylenol until after 6:30pm   Post Anesthesia Home Care Instructions  Activity: Get plenty of rest for the remainder of the day. A responsible individual must stay with you for 24 hours following the procedure.  For the next 24 hours, DO NOT: -Drive a car -Paediatric nurse -Drink alcoholic beverages -Take any medication unless instructed by your physician -Make any legal decisions or sign important papers.  Meals: Start with liquid foods such as gelatin or soup. Progress to regular foods as tolerated. Avoid greasy, spicy, heavy foods. If nausea and/or vomiting occur, drink only clear liquids until the nausea and/or vomiting subsides. Call your physician if vomiting continues.  Special Instructions/Symptoms: Your throat may feel dry or sore from the anesthesia or the breathing tube placed in your throat during surgery. If this causes discomfort, gargle with warm salt water. The discomfort should disappear within 24 hours.  If you had a scopolamine patch placed behind  your ear for the management of post- operative nausea and/or vomiting:  1. The medication in the patch is effective for 72 hours, after which it should be removed.  Wrap patch in a tissue and discard in the trash. Wash hands thoroughly with soap and water. 2. You may remove the patch earlier than 72 hours if you experience unpleasant side effects which may include dry mouth, dizziness or visual disturbances. 3. Avoid touching the patch. Wash your hands with soap and water after contact with the patch.

## 2020-09-26 NOTE — Anesthesia Postprocedure Evaluation (Signed)
Anesthesia Post Note  Patient: Michaela Williams  Procedure(s) Performed: LAPAROSCOPIC BILATERAL SALPINGECTOMY (Bilateral Uterus) INTRAUTERINE DEVICE (IUD) REMOVAL (N/A Uterus)     Patient location during evaluation: PACU Anesthesia Type: General Level of consciousness: sedated Pain management: pain level controlled Vital Signs Assessment: post-procedure vital signs reviewed and stable Respiratory status: spontaneous breathing and respiratory function stable Cardiovascular status: stable Postop Assessment: no apparent nausea or vomiting Anesthetic complications: no   No complications documented.  Last Vitals:  Vitals:   09/26/20 1530 09/26/20 1550  BP: 110/76 114/77  Pulse: 87 76  Resp: 14 16  Temp:  36.7 C  SpO2: 99% 99%    Last Pain:  Vitals:   09/26/20 1550  TempSrc:   PainSc: 0-No pain                 Tanija Germani DANIEL

## 2020-09-26 NOTE — Interval H&P Note (Signed)
History and Physical Interval Note:  09/26/2020 12:32 PM  Michaela Williams  has presented today for surgery, with the diagnosis of Undesired Fertility.  The various methods of treatment have been discussed with the patient and family. After consideration of risks, benefits and other options for treatment, the patient has consented to  Procedure(s): LAPAROSCOPIC TUBAL LIGATION (Bilateral) as a surgical intervention.  The patient's history has been reviewed, patient examined, no change in status, stable for surgery.  I have reviewed the patient's chart and labs.  Questions were answered to the patient's satisfaction.     Chancy Milroy

## 2020-09-26 NOTE — Op Note (Signed)
Michaela Williams PROCEDURE DATE: 09/26/2020   PREOPERATIVE DIAGNOSIS:  Undesired fertility and removal of IUD  POSTOPERATIVE DIAGNOSIS:  Undesired fertility  PROCEDURE:  Laparoscopic Bilateral Salpingectomy and removal of IUD  SURGEON:  Arlina Robes MD    ANESTHESIA:  General endotracheal  COMPLICATIONS:  None immediate.  ESTIMATED BLOOD LOSS:  5 ml.  FLUIDS: As recorded  URINE OUTPUT:  Pt voided prior to procedure  IINDICATIONS: 25 y.o. [id number] with undesired fertility, desires permanent sterilization. Other reversible forms of contraception were discussed with patient; she declines all other modalities.  Risks of procedure discussed with patient including permanence of method, risk of regret, bleeding, infection, injury to surrounding organs and need for additional procedures including laparotomy.  Failure risk less than 0.5% with increased risk of ectopic gestation if pregnancy occurs was also discussed with patient.  Written informed consent was obtained.    FINDINGS:  Normal uterus, fallopian tubes, and ovaries.  TECHNIQUE:  The patient was taken to the operating room where general anesthesia was obtained without difficulty.  She was then placed in the dorsal lithotomy position and prepared and draped in sterile fashion.  After an adequate timeout was performed, a bivalved speculum was then placed in the patient's vagina, and the anterior lip of cervix grasped with the single-tooth tenaculum. IUD strings were noted, grasped and IUD was removed without problems.  The uterine manipulator was then advanced into the uterus.  The speculum was removed from the vagina.  Attention was then turned to the patient's abdomen where a 11-mm skin incision was made in the umbilical fold.  The Optiview 11-mm trocar and sleeve were then advanced without difficulty with the laparoscope under direct visualization into the abdomen.  The abdomen was then insufflated with carbon dioxide gas.  Adequate  pneumoperitoneum was obtained.  A survey of the patient's pelvis and abdomen revealed the findings above. Bilateral 5-mm lower quadrant ports  were then placed under direct visualization.  The fallopian tubes were transected from the uterine attachments and the underlying mesosalpinx with the Harmonic device allowing for bilateral salpingectomy.  The fallopian tubes were then removed from the abdomen under direct visualization.  The operative site was surveyed, and it was found to be hemostatic.   No intraoperative injury to other surrounding organs was noted.  The abdomen was desufflated and all instruments were then removed from the patient's abdomen. The fascial incision of the umbilicus was closed with a 0 Vicryl figure of eight stitch. All skin incisions were closed with Dermabond.  The uterine manipulator was removed from the cervix without complications. The patient tolerated the procedure well.  Sponge, lap, and needle counts were correct times two.  The patient was then taken to the recovery room awake, extubated and in stable condition.  The patient will be discharged to home as per PACU criteria.  Routine postoperative instructions given.  She was prescribed Percocet and Ibuprofen. She will follow up in the clinic on 3-4 weeks for postoperative evaluation.   Arlina Robes, MD, [place] Attending [place], [place]

## 2020-09-26 NOTE — Anesthesia Procedure Notes (Signed)
Procedure Name: Intubation Date/Time: 09/26/2020 1:36 PM Performed by: Maryella Shivers, CRNA Pre-anesthesia Checklist: Patient identified, Emergency Drugs available, Suction available and Patient being monitored Patient Re-evaluated:Patient Re-evaluated prior to induction Oxygen Delivery Method: Circle system utilized Preoxygenation: Pre-oxygenation with 100% oxygen Induction Type: IV induction Ventilation: Mask ventilation without difficulty Laryngoscope Size: Mac and 3 Grade View: Grade I Tube type: Oral Number of attempts: 1 Airway Equipment and Method: Stylet and Oral airway Placement Confirmation: ETT inserted through vocal cords under direct vision,  positive ETCO2 and breath sounds checked- equal and bilateral Tube secured with: Tape Dental Injury: Teeth and Oropharynx as per pre-operative assessment

## 2020-09-26 NOTE — Transfer of Care (Signed)
Immediate Anesthesia Transfer of Care Note  Patient: DEMIKA LANGENDERFER  Procedure(s) Performed: LAPAROSCOPIC BILATERAL SALPINGECTOMY (Bilateral Uterus) INTRAUTERINE DEVICE (IUD) REMOVAL (N/A Uterus)  Patient Location: PACU  Anesthesia Type:General  Level of Consciousness: sedated  Airway & Oxygen Therapy: Patient Spontanous Breathing and Patient connected to face mask oxygen  Post-op Assessment: Report given to RN and Post -op Vital signs reviewed and stable  Post vital signs: Reviewed and stable  Last Vitals:  Vitals Value Taken Time  BP 107/70 09/26/20 1502  Temp    Pulse 84 09/26/20 1508  Resp 17 09/26/20 1508  SpO2 100 % 09/26/20 1508  Vitals shown include unvalidated device data.  Last Pain:  Vitals:   09/26/20 1504  TempSrc:   PainSc: Asleep      Patients Stated Pain Goal: 1 (93/23/55 [id number])  Complications: No complications documented.

## 2020-09-27 ENCOUNTER — Encounter (HOSPITAL_BASED_OUTPATIENT_CLINIC_OR_DEPARTMENT_OTHER): Payer: Self-pay | Admitting: Obstetrics and Gynecology

## 2020-09-27 NOTE — Anesthesia Postprocedure Evaluation (Signed)
Anesthesia Post Note  Patient: Michaela Williams  Procedure(s) Performed: LAPAROSCOPIC BILATERAL SALPINGECTOMY (Bilateral Uterus) INTRAUTERINE DEVICE (IUD) REMOVAL (N/A Uterus)     Patient location during evaluation: PACU Anesthesia Type: General Level of consciousness: awake and alert Pain management: pain level controlled Vital Signs Assessment: post-procedure vital signs reviewed and stable Respiratory status: spontaneous breathing, nonlabored ventilation and respiratory function stable Cardiovascular status: blood pressure returned to baseline and stable Postop Assessment: no apparent nausea or vomiting Anesthetic complications: no   No complications documented.  Last Vitals:  Vitals:   09/26/20 1530 09/26/20 1550  BP: 110/76 114/77  Pulse: 87 76  Resp: 14 16  Temp:  36.7 C  SpO2: 99% 99%    Last Pain:  Vitals:   09/26/20 1550  TempSrc:   PainSc: 0-No pain   Pain Goal: Patients Stated Pain Goal: 1 (09/26/20 1218)                 Lynda Rainwater

## 2020-09-28 LAB — SURGICAL PATHOLOGY

## 2020-10-02 DIAGNOSIS — R42 Dizziness and giddiness: Secondary | ICD-10-CM | POA: Diagnosis not present

## 2020-10-02 DIAGNOSIS — R1033 Periumbilical pain: Secondary | ICD-10-CM | POA: Diagnosis not present

## 2020-10-02 DIAGNOSIS — N838 Other noninflammatory disorders of ovary, fallopian tube and broad ligament: Secondary | ICD-10-CM | POA: Diagnosis not present

## 2020-10-02 DIAGNOSIS — N83202 Unspecified ovarian cyst, left side: Secondary | ICD-10-CM | POA: Diagnosis not present

## 2020-10-02 DIAGNOSIS — Z79899 Other long term (current) drug therapy: Secondary | ICD-10-CM | POA: Diagnosis not present

## 2020-10-02 DIAGNOSIS — G8918 Other acute postprocedural pain: Secondary | ICD-10-CM | POA: Diagnosis not present

## 2020-10-02 DIAGNOSIS — Z888 Allergy status to other drugs, medicaments and biological substances status: Secondary | ICD-10-CM | POA: Diagnosis not present

## 2020-10-02 DIAGNOSIS — R109 Unspecified abdominal pain: Secondary | ICD-10-CM | POA: Diagnosis not present

## 2020-10-02 DIAGNOSIS — N281 Cyst of kidney, acquired: Secondary | ICD-10-CM | POA: Diagnosis not present

## 2020-10-03 DIAGNOSIS — N838 Other noninflammatory disorders of ovary, fallopian tube and broad ligament: Secondary | ICD-10-CM | POA: Diagnosis not present

## 2020-10-03 DIAGNOSIS — N281 Cyst of kidney, acquired: Secondary | ICD-10-CM | POA: Diagnosis not present

## 2020-10-03 DIAGNOSIS — R109 Unspecified abdominal pain: Secondary | ICD-10-CM | POA: Diagnosis not present

## 2020-10-04 ENCOUNTER — Ambulatory Visit (INDEPENDENT_AMBULATORY_CARE_PROVIDER_SITE_OTHER): Payer: 59 | Admitting: Licensed Clinical Social Worker

## 2020-10-04 ENCOUNTER — Other Ambulatory Visit: Payer: Self-pay

## 2020-10-04 ENCOUNTER — Telehealth: Payer: Self-pay | Admitting: *Deleted

## 2020-10-04 DIAGNOSIS — F3162 Bipolar disorder, current episode mixed, moderate: Secondary | ICD-10-CM | POA: Diagnosis not present

## 2020-10-04 NOTE — Telephone Encounter (Signed)
Transition Care Management Follow-up Telephone Call  Date of discharge and from where: 10/03/2020 - [facility]  How have you been since you were released from the hospital? "About the same"  Any questions or concerns? No  Items Reviewed:  Did the pt receive and understand the discharge instructions provided? Yes   Medications obtained and verified? Yes   Other? No   Any new allergies since your discharge? No   Dietary orders reviewed? No  Do you have support at home? Yes    Functional Questionnaire: (I = Independent and D = Dependent) ADLs: I  Bathing/Dressing- I  Meal Prep- I  Eating- I  Maintaining continence- I  Transferring/Ambulation- I  Managing Meds- I  Follow up appointments reviewed:   PCP Hospital f/u appt confirmed? No    Specialist Hospital f/u appt confirmed? Yes  Scheduled to see OBGYN on 10/31/2020 @ 1000.  Are transportation arrangements needed? No   If their condition worsens, is the pt aware to call PCP or go to the Emergency Dept.? Yes  Was the patient provided with contact information for the PCP's office or ED? Yes  Was to pt encouraged to call back with questions or concerns? Yes

## 2020-10-08 NOTE — Progress Notes (Signed)
   THERAPIST PROGRESS NOTE   Virtual Visit via Video Note  I connected with Michaela Williams on 10/04/20 at 11:00 AM EDT by a video enabled telemedicine application and verified that I am speaking with the correct person using two identifiers.  Location: Patient: At boyfriend's home Provider: [facility]   I discussed the limitations of evaluation and management by telemedicine and the availability of in person appointments. The patient expressed understanding and agreed to proceed. I discussed the assessment and treatment plan with the patient. The patient was provided an opportunity to ask questions and all were answered. The patient agreed with the plan and demonstrated an understanding of the instructions.   I provided 41 minutes of non-face-to-face time during this encounter.  Participation Level: Active  Behavioral Response: CasualLethargicAnxious and Depressed  Type of Therapy: Individual Therapy  Treatment Goals addressed: Communication: Anx/dep/coping  Interventions: Solution Focused and Supportive  Summary: Michaela Williams is a 25 y.o. female who presents with hx of Bipolar Dis. This date pt states she is at her boyfriend's home. She presents as depressed in mood yet states she just got up and is not feeling dep, just lethargic and did not sleep will last night. Pt advises she has been out of work d/t recent surgery to have her tubes tied. She reports she and her boyfriend have not had successful house hunting and are thinking of moving into his home together until they can find another home with more room. Pt states her lease is up in June. Boyfriend does still have a mortgage on his home. 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 Pt reports financial stress. LCSW assessed for pt writing letters she spoke of to fam for [name]"s Day. Pt reports she wrote them but did not give them. She states she decided they would just become  angry to she did not present them. Pt states her mother especially "takes everything so personal". Pt affirms it was helpful to do the writing. Pt states she has completely restructured her thoughts on setting financial boundaries with parents who "expect me to give them everything I have". Pt speaks of resentment and the fact her sister and brother are not asked to do what she is asked to do. Pt reports "I'm not mad at myself anymore", and reflects on the fact she was a child when acting out. She states "I'm not going to allow people to guilt me anymore". Discussion on how to set boundaries addressed. Pt will write out what she is thinking of saying prior to next session and make it about moving forward rather than the past. Pt advises she did get self talk lit and read it but since it was a while back she cannot attend to details. Pt agrees to read lit again. LCSW reviewed poc including scheduling prior to close of session. Pt states appreciation for care.      Suicidal/Homicidal: Nowithout intent/plan  Therapist Response: Pt remains receptive to care.  Plan: Return again in ~3 weeks.  Diagnosis: Axis I: Bipolar, mixed  Hermine Messick, LCSW 10/08/2020

## 2020-10-25 ENCOUNTER — Other Ambulatory Visit: Payer: Self-pay

## 2020-10-25 ENCOUNTER — Ambulatory Visit (INDEPENDENT_AMBULATORY_CARE_PROVIDER_SITE_OTHER): Payer: 59 | Admitting: Licensed Clinical Social Worker

## 2020-10-25 DIAGNOSIS — F3162 Bipolar disorder, current episode mixed, moderate: Secondary | ICD-10-CM | POA: Diagnosis not present

## 2020-10-29 NOTE — Progress Notes (Signed)
   THERAPIST PROGRESS NOTE   Virtual Visit via Video Note  I connected with Michaela Williams on 10/25/20 at  1:00 PM EDT by a video enabled telemedicine application and verified that I am speaking with the correct person using two identifiers.  Location: Patient: Boyfriend's home Provider: [facility]   I discussed the limitations of evaluation and management by telemedicine and the availability of in person appointments. The patient expressed understanding and agreed to proceed. I discussed the assessment and treatment plan with the patient. The patient was provided an opportunity to ask questions and all were answered. The patient agreed with the plan and demonstrated an understanding of the instructions.  I provided 41 minutes of non-face-to-face time during this encounter.  Participation Level: Active  Behavioral Response: CasualLethargicDepressed  Type of Therapy: Individual Therapy  Treatment Goals addressed: Communication: Dep/Anx/Coping  Interventions: Supportive  Summary: Michaela Williams is a 25 y.o. female who presents with hx of Biploar Dis. This date pt is signed on for video session per her preference. Pt is notably yawning and says she is tired as she only got ~ 5 hrs sleep last night and that was not restful sleep. Pt states she was up and down with her dtr. Assessment of current mood reveals pt stating "My depression is creeping up". Pt taking meds as prescribed. Pt admits to some intermittent thoughts of SI. She states she has no intent and is not sure why this is her first thought when she feels life stressors. Reviewed self talk and process of changing self talk. Pt also has some anx r/t new job. 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 She reports on how child care will be managed. Pt sates bio father will watch dtr as he works at night. She will be paying grandmother to watch her 61 yr old son until daycare can be obtained. 90 son is school age.  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 She states her boyfriend is concerned this will be too much for pt yet she is excited and hopes it will work out. She has already told them she will have some virtual appts at times. Pt reports she did talk to her parents about not being able to continue to support them financially. She states she did this while on fam trip to [facility] ~ 2 wks ago. Pt states father was "really cool" and "understood". She states her mother "Didn't take too well to it". Pt states since discussion mother has been more guarded with her. Pt nonetheless feels good she was able to set this boundary. Pt states she and boyfriend continue to look for another house but she is staying with him in the mean time. Pt states her lease is up in May and she will move in with him if they have not found a bigger place yet. Pt denies any other new worries/concerns. LCSW wished pt an early Happy Birthday. Reviewed poc including scheduling prior to close of session. Pt states appreciation for care.     Suicidal/Homicidal: Intermittentwithout intent/plan  Therapist Response: Pt remains receptive to care.  Plan: Return again for next avail appt.  Diagnosis: Axis I: Bipolar, mixed  Hermine Messick, LCSW 10/29/2020

## 2020-10-31 ENCOUNTER — Encounter: Payer: Self-pay | Admitting: Obstetrics and Gynecology

## 2020-10-31 ENCOUNTER — Ambulatory Visit (INDEPENDENT_AMBULATORY_CARE_PROVIDER_SITE_OTHER): Payer: Medicaid Other | Admitting: Obstetrics and Gynecology

## 2020-10-31 ENCOUNTER — Other Ambulatory Visit: Payer: Self-pay

## 2020-10-31 DIAGNOSIS — Z9889 Other specified postprocedural states: Secondary | ICD-10-CM | POA: Insufficient documentation

## 2020-10-31 DIAGNOSIS — Z9079 Acquired absence of other genital organ(s): Secondary | ICD-10-CM | POA: Insufficient documentation

## 2020-10-31 NOTE — Progress Notes (Signed)
Patient ID: Michaela Williams, female   DOB: 08/10/1995, 25 y.o.   MRN: [id number] Michaela Williams presents for post op visit. S/P Bilateral salpingectomy and removal of IUD. She has no complaints today.  Denies any bowel or bladder dysfunction Has had a cycle since surgery without problems  Pathology reviewed with pt  PE AF VSS Lungs clear  Heart RRR Abd soft + BS incisions well healed  A/P Post op exam  Return to nl ADL's as tolerates. F/U PRN

## 2020-10-31 NOTE — Patient Instructions (Signed)
Health Maintenance, Female Adopting a healthy lifestyle and getting preventive care are important in promoting health and wellness. Ask your health care provider about:  The right schedule for you to have regular tests and exams.  Things you can do on your own to prevent diseases and keep yourself healthy. What should I know about diet, weight, and exercise? Eat a healthy diet  Eat a diet that includes plenty of vegetables, fruits, low-fat dairy products, and lean protein.  Do not eat a lot of foods that are high in solid fats, added sugars, or sodium.   Maintain a healthy weight Body mass index (BMI) is used to identify weight problems. It estimates body fat based on height and weight. Your health care provider can help determine your BMI and help you achieve or maintain a healthy weight. Get regular exercise Get regular exercise. This is one of the most important things you can do for your health. Most adults should:  Exercise for at least 150 minutes each week. The exercise should increase your heart rate and make you sweat (moderate-intensity exercise).  Do strengthening exercises at least twice a week. This is in addition to the moderate-intensity exercise.  Spend less time sitting. Even light physical activity can be beneficial. Watch cholesterol and blood lipids Have your blood tested for lipids and cholesterol at 26 years of age, then have this test every 5 years. Have your cholesterol levels checked more often if:  Your lipid or cholesterol levels are high.  You are older than 26 years of age.  You are at high risk for heart disease. What should I know about cancer screening? Depending on your health history and family history, you may need to have cancer screening at various ages. This may include screening for:  Breast cancer.  Cervical cancer.  Colorectal cancer.  Skin cancer.  Lung cancer. What should I know about heart disease, diabetes, and high blood  pressure? Blood pressure and heart disease  High blood pressure causes heart disease and increases the risk of stroke. This is more likely to develop in people who have high blood pressure readings, are of African descent, or are overweight.  Have your blood pressure checked: ? Every 3-5 years if you are 18-39 years of age. ? Every year if you are 40 years old or older. Diabetes Have regular diabetes screenings. This checks your fasting blood sugar level. Have the screening done:  Once every three years after age 40 if you are at a normal weight and have a low risk for diabetes.  More often and at a younger age if you are overweight or have a high risk for diabetes. What should I know about preventing infection? Hepatitis B If you have a higher risk for hepatitis B, you should be screened for this virus. Talk with your health care provider to find out if you are at risk for hepatitis B infection. Hepatitis C Testing is recommended for:  Everyone born from 1945 through 1965.  Anyone with known risk factors for hepatitis C. Sexually transmitted infections (STIs)  Get screened for STIs, including gonorrhea and chlamydia, if: ? You are sexually active and are younger than 26 years of age. ? You are older than 26 years of age and your health care provider tells you that you are at risk for this type of infection. ? Your sexual activity has changed since you were last screened, and you are at increased risk for chlamydia or gonorrhea. Ask your health care provider   if you are at risk.  Ask your health care provider about whether you are at high risk for HIV. Your health care provider may recommend a prescription medicine to help prevent HIV infection. If you choose to take medicine to prevent HIV, you should first get tested for HIV. You should then be tested every 3 months for as long as you are taking the medicine. Pregnancy  If you are about to stop having your period (premenopausal) and  you may become pregnant, seek counseling before you get pregnant.  Take 400 to 800 micrograms (mcg) of folic acid every day if you become pregnant.  Ask for birth control (contraception) if you want to prevent pregnancy. Osteoporosis and menopause Osteoporosis is a disease in which the bones lose minerals and strength with aging. This can result in bone fractures. If you are 65 years old or older, or if you are at risk for osteoporosis and fractures, ask your health care provider if you should:  Be screened for bone loss.  Take a calcium or vitamin D supplement to lower your risk of fractures.  Be given hormone replacement therapy (HRT) to treat symptoms of menopause. Follow these instructions at home: Lifestyle  Do not use any products that contain nicotine or tobacco, such as cigarettes, e-cigarettes, and chewing tobacco. If you need help quitting, ask your health care provider.  Do not use street drugs.  Do not share needles.  Ask your health care provider for help if you need support or information about quitting drugs. Alcohol use  Do not drink alcohol if: ? Your health care provider tells you not to drink. ? You are pregnant, may be pregnant, or are planning to become pregnant.  If you drink alcohol: ? Limit how much you use to 0-1 drink a day. ? Limit intake if you are breastfeeding.  Be aware of how much alcohol is in your drink. In the [place] one drink equals one 12 oz bottle of beer (355 mL), one 5 oz glass of wine (148 mL), or one 1 oz glass of hard liquor (44 mL). General instructions  Schedule regular health, dental, and eye exams.  Stay current with your vaccines.  Tell your health care provider if: ? You often feel depressed. ? You have ever been abused or do not feel safe at home. Summary  Adopting a healthy lifestyle and getting preventive care are important in promoting health and wellness.  Follow your health care provider's instructions about healthy  diet, exercising, and getting tested or screened for diseases.  Follow your health care provider's instructions on monitoring your cholesterol and blood pressure. This information is not intended to replace advice given to you by your health care provider. Make sure you discuss any questions you have with your health care provider. Document Revised: 06/16/2018 Document Reviewed: 06/16/2018 Elsevier Patient Education  2021 Elsevier Inc.  

## 2020-10-31 NOTE — Progress Notes (Signed)
RGYN patient presents for Post Op following  Laparoscopic Bilateral Salpingectomy and removal of IUD procedure on 09/26/20   Pt denies any pain or bleeding today.   CC: None   *Pt declines exam.

## 2020-11-12 ENCOUNTER — Encounter (HOSPITAL_COMMUNITY): Payer: Self-pay | Admitting: Psychiatry

## 2020-11-12 ENCOUNTER — Other Ambulatory Visit: Payer: Self-pay

## 2020-11-12 ENCOUNTER — Telehealth (INDEPENDENT_AMBULATORY_CARE_PROVIDER_SITE_OTHER): Payer: 59 | Admitting: Psychiatry

## 2020-11-12 DIAGNOSIS — F3181 Bipolar II disorder: Secondary | ICD-10-CM | POA: Diagnosis not present

## 2020-11-12 DIAGNOSIS — F411 Generalized anxiety disorder: Secondary | ICD-10-CM

## 2020-11-12 MED ORDER — ARIPIPRAZOLE 20 MG PO TABS: 20.0000 mg | ORAL_TABLET | Freq: Every day | ORAL | 2 refills | Status: DC

## 2020-11-12 MED ORDER — FLUOXETINE HCL 20 MG PO CAPS: 20.0000 mg | ORAL_CAPSULE | Freq: Every day | ORAL | 2 refills | Status: DC

## 2020-11-12 NOTE — Progress Notes (Signed)
[place] MD/PA/NP OP Progress Note Virtual Visit via Telephone Note  I connected with Michaela Williams on 11/12/20 at  2:30 PM EDT by telephone and verified that I am speaking with the correct person using two identifiers.  Location: Patient: Work Provider: Clinic   I discussed the limitations, risks, security and privacy concerns of performing an evaluation and management service by telephone and the availability of in person appointments. I also discussed with the patient that there may be a patient responsible charge related to this service. The patient expressed understanding and agreed to proceed.   I provided 30 minutes of non-face-to-face time during this encounter.   11/12/2020 3:01 PM Michaela Williams  MRN:  [id number]  Chief Complaint: "The meds are working well"  HPI: 25 year old female seen today for follow up psychiatric evaluation.  She has a psychiatric history of bipolar 1 disorder, bipolar 2 disorder, adjustment disorder, anxiety, and depression.  She is currently managed on Abilify 20, Prozac 20 mg, and trazodone 50-100 mg daily.  She notes that her medications are effective in managing her psychiatric conditions.  Today she was well-groomed, pleasant, cooperative, engaged in conversation, and maintained eye contact.  She informed provider that since her last visit she has been doing better and notes that she feels like her meds are working well.  She informed Probation officer that her anxiety and her depression has improved.  Today provider conducted a GAD-7 and patient scored a 6, at her last visit she scored a 14.  Provider also conducted a PHQ-9 and patient scored a 4, at her last visit she scored a 13.  Today she endorses adequate sleep and appetite.  She informed provider that since she stopped breast-feeding her child she is gained 30 pounds.  Today she notes her mood has been stable on her Abilify dose and denies SI/HI/VAH, paranoia, or mania.  No medication changes made today.  Patient  agreeable to continue medication as scribed.  She will follow-up with outpatient counseling for therapy.  No other concerns noted at this time.    Visit Diagnosis:    ICD-10-CM   1. Bipolar 2 disorder, major depressive episode (HCC)  F31.81 ARIPiprazole (ABILIFY) 20 MG tablet    FLUoxetine (PROZAC) 20 MG capsule  2. Generalized anxiety disorder  F41.1 FLUoxetine (PROZAC) 20 MG capsule    Past Psychiatric History: bipolar 1 disorder, bipolar 2 disorder, adjustment disorder, anxiety, and depression.    Past Medical History:  Past Medical History:  Diagnosis Date  . Anemia   . Anxiety   . Asthma    in childhood   . breast ca dx'd 05/2017   left  . Breast cancer ([place]) 05/2017   dx at age 18  . Depression    no meds in 3 yrs  . Family history of breast cancer   . Headache    migraines  . Infection    UTI  . Seizures ([place]) 2014   "seizure like activity", reaction to medicaiton, none since  . UGI bleed 06/02/2017  . Unwanted fertility 08/22/2020   BTL papers signed 08/20/20 Bilateral salpingectomy 09/2020    Past Surgical History:  Procedure Laterality Date  . ESOPHAGOGASTRODUODENOSCOPY (EGD) WITH PROPOFOL N/A 06/03/2017   Procedure: ESOPHAGOGASTRODUODENOSCOPY (EGD) WITH PROPOFOL;  Surgeon: Ladene Artist, MD;  Location: [facility];  Service: Endoscopy;  Laterality: N/A;  . IUD REMOVAL N/A 09/26/2020   Procedure: INTRAUTERINE DEVICE (IUD) REMOVAL;  Surgeon: Chancy Milroy, MD;  Location: [place];  Service: Gynecology;  Laterality: N/A;  . LAPAROSCOPIC BILATERAL SALPINGECTOMY Bilateral 09/26/2020   Procedure: LAPAROSCOPIC BILATERAL SALPINGECTOMY;  Surgeon: Chancy Milroy, MD;  Location: [place];  Service: Gynecology;  Laterality: Bilateral;    Family Psychiatric History: Patient was adopted but noted that she was told that her biological mother may have had bipolar disorder or schizophrenia  Family History:  Family History  Adopted: Yes   Problem Relation Age of Onset  . Mental illness Mother   . Breast cancer Mother   . Hypertension Maternal Grandmother   . Diabetes Maternal Grandmother   . Breast cancer Maternal Grandmother        d. in her 26s, "dealing with breast cancer all her life"  . Breast cancer Maternal Aunt   . Cervical cancer Maternal Aunt   . Breast cancer Sister   . Breast cancer Maternal Aunt   . Breast cancer Maternal Aunt   . Breast cancer Maternal Aunt   . Breast cancer Maternal Aunt   . Breast cancer Half-Sister        dx in her late 1s  . Breast cancer Half-Sister        dx in her late 65s  . Cervical cancer Half-Sister   . Breast cancer Half-Sister        dx in her late 22s  . Leukemia Nephew 4       currently in his teens    Social History:  Social History   Socioeconomic History  . Marital status: Single    Spouse name: Not on file  . Number of children: 1  . Years of education: Not on file  . Highest education level: Not on file  Occupational History  . Occupation: unemployed  Tobacco Use  . Smoking status: Former Smoker    Types: Cigarettes    Quit date: 12/19/2014    Years since quitting: 5.9  . Smokeless tobacco: Never Used  . Tobacco comment: age 11  Vaping Use  . Vaping Use: Former  Substance and Sexual Activity  . Alcohol use: Not Currently    Comment: occasionally  . Drug use: No  . Sexual activity: Not Currently    Birth control/protection: None  Other Topics Concern  . Not on file  Social History Narrative  . Not on file   Social Determinants of Health   Financial Resource Strain: Not on file  Food Insecurity: Not on file  Transportation Needs: Not on file  Physical Activity: Not on file  Stress: Not on file  Social Connections: Not on file    Allergies:  Allergies  Allergen Reactions  . Flexeril [Cyclobenzaprine]     Patient had a near syncopal event following Flexeril PO in [facility]U 11/21/2019  . Lithium Other (See Comments)    SEIZURES, Childhood  reaction  . Lexapro [Escitalopram] Other (See Comments)    Didn't like how it made her feel    Metabolic Disorder Labs: Lab Results  Component Value Date   HGBA1C 5.0 07/03/2017   MPG 96.8 07/03/2017   MPG 96.8 07/01/2017   Lab Results  Component Value Date   PROLACTIN 184.0 (H) 07/01/2017   Lab Results  Component Value Date   CHOL 196 07/03/2017   TRIG 45 07/03/2017   HDL 63 07/03/2017   CHOLHDL 3.1 07/03/2017   VLDL 9 07/03/2017   LDLCALC 124 (H) 07/03/2017   Lab Results  Component Value Date   TSH 1.650 12/07/2018   TSH 1.350 01/06/2018  Therapeutic Level Labs: No results found for: LITHIUM No results found for: VALPROATE No components found for:  CBMZ  Current Medications: Current Outpatient Medications  Medication Sig Dispense Refill  . ARIPiprazole (ABILIFY) 20 MG tablet Take 1 tablet (20 mg total) by mouth daily. 30 tablet 2  . FLUoxetine (PROZAC) 20 MG capsule Take 1 capsule (20 mg total) by mouth daily. 30 capsule 2   No current facility-administered medications for this visit.     Musculoskeletal: Strength & Muscle Tone: Unable to assess due to telehealth visit [place]: Unable to assess due to telehealth visit Patient leans: N/A  Psychiatric Specialty Exam: Review of Systems  currently breastfeeding.There is no height or weight on file to calculate BMI.  General Appearance: Well Groomed  Eye Contact:  Good  Speech:  Clear and Coherent and Normal Rate  Volume:  Normal  Mood:  Euthymic  Affect:  Congruent and Non-Congruent  Thought Process:  Coherent, Goal Directed and Linear  Orientation:  Full (Time, Place, and Person)  Thought Content: WDL and Logical   Suicidal Thoughts:  No  Homicidal Thoughts:  No  Memory:  Immediate;   Good Recent;   Good Remote;   Good  Judgement:  Good  Insight:  Good  Psychomotor Activity:  Normal  Concentration:  Concentration: Good and Attention Span: Good  Recall:  Good  Fund of Knowledge: Good   Language: Good  Akathisia:  No  Handed:  Right  AIMS (if indicated):Not done  Assets:  Communication Skills Desire for Improvement Financial Resources/Insurance Housing Intimacy Social Support  ADL's:  Intact  Cognition: WNL  Sleep:  Good   Screenings: AIMS   Flowsheet Row Admission (Discharged) from OP Visit from 07/01/2017 in [place] [facility]  AIMS Total Score 0    AUDIT   Flowsheet Row Admission (Discharged) from OP Visit from 07/01/2017 in [place] [facility]  Alcohol Use Disorder Identification Test Final Score (AUDIT) 1    GAD-7   Flowsheet Row Video Visit from 11/12/2020 in [facility] Video Visit from 08/15/2020 in [facility] Video Visit from 05/23/2020 in [place] from 12/28/2019 in [facility] [place] [facility] Office Visit from 05/14/2017 in [facility] Care at [facility]  Total GAD-7 Score 6 14 21 21 17     PHQ2-9   Flowsheet Row Video Visit from 11/12/2020 in [facility] Video Visit from 08/15/2020 in [facility] Video Visit from 05/23/2020 in [place] from 12/28/2019 in [place] for [place] [facility] Office Visit from 05/03/2019 in Primary Care at [facility] Total Score 0 2 5 5  0  PHQ-9 Total Score 4 13 22 25  --    Flowsheet Row Video Visit from 11/12/2020 in [facility] Admission (Discharged) from 09/26/2020 in [place] Admission (Discharged) from 02/08/2020 in [place] [facility]  C-SSRS RISK CATEGORY No Risk No Risk High Risk       Assessment and Plan: Patient notes that she is doing well on her current medication regimen.  No medication changes made today.  Patient agreeable to continue  medication as prescribed.  1. Bipolar 2 disorder, major depressive episode (HCC)  Continue- FLUoxetine (PROZAC) 20 MG capsule; Take 1 capsule (20 mg total) by mouth daily.  Dispense: 30 capsule; Refill:  2 Continue- traZODone (DESYREL) 100 MG tablet; Take 1 tablet (100 mg total) by mouth at bedtime as needed for sleep.  Dispense: 30 tablet; Refill: 2 Continue- ARIPiprazole (ABILIFY) 20 MG tablet; Take 1 tablet (20 mg total) by mouth daily.  Dispense: 30 tablet; Refill: 2  2. Generalized anxiety disorder  Increased- FLUoxetine (PROZAC) 20 MG capsule; Take 1 capsule (20 mg total) by mouth daily.  Dispense: 30 capsule; Refill: 2  Follow-up in 3 months Follow-up with therapy Salley Slaughter, NP 11/12/2020, 3:01 PM

## 2020-12-17 ENCOUNTER — Ambulatory Visit (INDEPENDENT_AMBULATORY_CARE_PROVIDER_SITE_OTHER): Payer: 59 | Admitting: Licensed Clinical Social Worker

## 2020-12-17 ENCOUNTER — Other Ambulatory Visit: Payer: Self-pay

## 2020-12-17 DIAGNOSIS — F3161 Bipolar disorder, current episode mixed, mild: Secondary | ICD-10-CM | POA: Diagnosis not present

## 2020-12-20 NOTE — Progress Notes (Signed)
   THERAPIST PROGRESS NOTE   Virtual Visit via Video Note  I connected with Michaela Williams on 12/17/20 at  2:00 PM EDT by a video enabled telemedicine application and verified that I am speaking with the correct person using two identifiers.  Location: Patient: Work in a Health and safety inspector: [facility]   I discussed the limitations of evaluation and management by telemedicine and the availability of in person appointments. The patient expressed understanding and agreed to proceed. I discussed the assessment and treatment plan with the patient. The patient was provided an opportunity to ask questions and all were answered. The patient agreed with the plan and demonstrated an understanding of the instructions.   I provided 25 minutes of non-face-to-face time during this encounter.  Participation Level: Active  Behavioral Response: Well GroomedAlertMostly positive  Type of Therapy: Individual Therapy  Treatment Goals addressed: Communication: dep/anx/coping  Interventions: Supportive  Summary: Michaela Williams is a 25 y.o. female who presents with hx of Bipolar Dis. This date when she signs on for video session she reports she is at work. 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 She provides some details. Pt states "I really like it" and reports being "happy", says "It is stress free". Pt advises her boyfriend is watching the children while she is at work since he has a different shift so not paying for child care. 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 Pt reports she will have benefits including PTO. She states "This is the best job I've ever had". Pt taking meds as prescribed. She feels her depression/anx is well managed, coping adequately. Pt reports relationship with boyfriend is going well. She is living with him full time, her apt vacated, and they are looking for a larger home. Assessment of relationships with  mother/father reveals pt did give her father money recently as she went over to see them and they had little food in the home. Pt asked her dad not to tell mother. Relationship with mother remains strained. Pt reports parents rent is going up $500 per mon and she is unsure what will happen. Pt denies other new worries/concerns. LCSW reviewed poc including scheduling. Pt states appreciation for care.   Suicidal/Homicidal: Nowithout intent/plan  Therapist Response: Pt remains receptive to care.  Plan: Return again for next avail appt.  Diagnosis: Axis I: Bipolar, mixed  Hermine Messick, LCSW 12/20/2020

## 2021-01-16 ENCOUNTER — Ambulatory Visit (INDEPENDENT_AMBULATORY_CARE_PROVIDER_SITE_OTHER): Payer: 59 | Admitting: Licensed Clinical Social Worker

## 2021-01-16 ENCOUNTER — Other Ambulatory Visit: Payer: Self-pay

## 2021-01-16 DIAGNOSIS — F3162 Bipolar disorder, current episode mixed, moderate: Secondary | ICD-10-CM | POA: Diagnosis not present

## 2021-01-19 NOTE — Progress Notes (Signed)
   THERAPIST PROGRESS NOTE   Virtual Visit via Video Note  I connected with Michaela Williams on 01/16/21 at  2:00 PM EDT by a video enabled telemedicine application and verified that I am speaking with the correct person using two identifiers.  Location: Patient: In car outside work Provider: [facility]   I discussed the limitations of evaluation and management by telemedicine and the availability of in person appointments. The patient expressed understanding and agreed to proceed. I discussed the assessment and treatment plan with the patient. The patient was provided an opportunity to ask questions and all were answered. The patient agreed with the plan and demonstrated an understanding of the instructions.  I provided 45 minutes of non-face-to-face time during this encounter.  Participation Level: Active  Behavioral Response: Well GroomedAlertAnxious and Depressed  Type of Therapy: Individual Therapy  Treatment Goals addressed: Communication: anx/dep/coping  Interventions: Solution Focused and Supportive  Summary: Michaela Williams is a 25 y.o. female who presents with hx of Bipolar Dis. This date pt states "I've had a trying few wks" saying she has been very stressed with some passive SI. Exploration of stressors reveals pt feeling overwhelmed with home life. Pt states she and her boyfriend have been at odds. Pt states she wants to go out and do things as a fam and he just wants to stay home. Pt reports she recently left and went out to eat alone when he refused to go. A nearby table expressed sadness she was alone, which impacted pt. She states she left the restaurant and went to the [facility] store. Reports she drank to excess that night after boyfriend gone to work. He came home and found her drunk and is upset with her. Pt upset with self. She states she poured the rest of the bottle of liquor out and has not drank since. They have not talked about incident other than when she was still under the  influence. Pt states boyfriend says she does not talk, she says he does not listen. Pt wants to include faith in their lives he does not. Pt reports she is also stressed by parenting role and dtr now walking which has changed care demands. LCSW assisted to process thoughts/feelings. Provided education on adjustment to living full time together, tremendous trifles, compromise, small obtainable goals and scheduled couples communication times. Reviewed realistic expectations and self talk, self care, coping strategies. Pt going to the gym before work in the mornings, which was encrouaged. Reports she is taking meds as prescribed. LCSW reviewed poc including scheduling prior to close of session. Pt states appreciation for care with noted relief from session.     Suicidal/Homicidal:  Intermittentwithout intent/plan  Therapist Response: Pt remains responsive to care.  Plan: Return again for next avail appt.  Diagnosis: Axis I: Bipolar, mixed   Hermine Messick, LCSW 01/19/2021

## 2021-02-12 ENCOUNTER — Other Ambulatory Visit: Payer: Self-pay

## 2021-02-12 ENCOUNTER — Encounter (HOSPITAL_COMMUNITY): Payer: Self-pay | Admitting: Emergency Medicine

## 2021-02-12 ENCOUNTER — Emergency Department (HOSPITAL_COMMUNITY)
Admission: EM | Admit: 2021-02-12 | Discharge: 2021-02-12 | Disposition: A | Discharge status: Other Acute Inpatient Hospital | Payer: Medicaid Other | Attending: Emergency Medicine | Admitting: Emergency Medicine

## 2021-02-12 ENCOUNTER — Other Ambulatory Visit (HOSPITAL_COMMUNITY)
Admission: EM | Admit: 2021-02-12 | Discharge: 2021-02-12 | Disposition: A | Discharge status: Home / Self Care | Payer: 59

## 2021-02-12 ENCOUNTER — Telehealth (HOSPITAL_COMMUNITY): Payer: 59 | Admitting: Psychiatry

## 2021-02-12 DIAGNOSIS — R45851 Suicidal ideations: Secondary | ICD-10-CM

## 2021-02-12 DIAGNOSIS — J45909 Unspecified asthma, uncomplicated: Secondary | ICD-10-CM | POA: Diagnosis not present

## 2021-02-12 DIAGNOSIS — Y900 Blood alcohol level of less than 20 mg/100 ml: Secondary | ICD-10-CM | POA: Insufficient documentation

## 2021-02-12 DIAGNOSIS — F4323 Adjustment disorder with mixed anxiety and depressed mood: Secondary | ICD-10-CM | POA: Diagnosis not present

## 2021-02-12 DIAGNOSIS — Z20822 Contact with and (suspected) exposure to covid-19: Secondary | ICD-10-CM | POA: Insufficient documentation

## 2021-02-12 DIAGNOSIS — F332 Major depressive disorder, recurrent severe without psychotic features: Secondary | ICD-10-CM | POA: Diagnosis not present

## 2021-02-12 DIAGNOSIS — Z853 Personal history of malignant neoplasm of breast: Secondary | ICD-10-CM | POA: Insufficient documentation

## 2021-02-12 DIAGNOSIS — N9489 Other specified conditions associated with female genital organs and menstrual cycle: Secondary | ICD-10-CM | POA: Insufficient documentation

## 2021-02-12 DIAGNOSIS — Z87891 Personal history of nicotine dependence: Secondary | ICD-10-CM | POA: Insufficient documentation

## 2021-02-12 DIAGNOSIS — Z046 Encounter for general psychiatric examination, requested by authority: Secondary | ICD-10-CM | POA: Diagnosis present

## 2021-02-12 LAB — CBC WITH DIFFERENTIAL/PLATELET
Abs Immature Granulocytes: 0.03 10*3/uL (ref 0.00–0.07)
Basophils Absolute: 0 10*3/uL (ref 0.0–0.1)
Basophils Relative: 0 %
Eosinophils Absolute: 0.1 10*3/uL (ref 0.0–0.5)
Eosinophils Relative: 1 %
HCT: 40.2 % (ref 36.0–46.0)
Hemoglobin: 12.6 g/dL (ref 12.0–15.0)
Immature Granulocytes: 0 %
Lymphocytes Relative: 19 %
Lymphs Abs: 1.8 10*3/uL (ref 0.7–4.0)
MCH: 29.2 pg (ref 26.0–34.0)
MCHC: 31.3 g/dL (ref 30.0–36.0)
MCV: 93.3 fL (ref 80.0–100.0)
Monocytes Absolute: 0.7 10*3/uL (ref 0.1–1.0)
Monocytes Relative: 8 %
Neutro Abs: 6.8 10*3/uL (ref 1.7–7.7)
Neutrophils Relative %: 72 %
Platelets: 315 10*3/uL (ref 150–400)
RBC: 4.31 MIL/uL (ref 3.87–5.11)
RDW: 12.8 % (ref 11.5–15.5)
WBC: 9.4 10*3/uL (ref 4.0–10.5)
nRBC: 0 % (ref 0.0–0.2)

## 2021-02-12 LAB — COMPREHENSIVE METABOLIC PANEL
ALT: 16 U/L (ref 0–44)
AST: 15 U/L (ref 15–41)
Albumin: 3.7 g/dL (ref 3.5–5.0)
Alkaline Phosphatase: 79 U/L (ref 38–126)
Anion gap: 8 (ref 5–15)
BUN: 13 mg/dL (ref 6–20)
CO2: 24 mmol/L (ref 22–32)
Calcium: 9.2 mg/dL (ref 8.9–10.3)
Chloride: 103 mmol/L (ref 98–111)
Creatinine, Ser: 0.7 mg/dL (ref 0.44–1.00)
GFR, Estimated: >60 mL/min (ref >60)
Glucose, Bld: 119 mg/dL — ABNORMAL HIGH (ref 70–99)
Potassium: 3.8 mmol/L (ref 3.5–5.1)
Sodium: 135 mmol/L (ref 135–145)
Total Bilirubin: 0.4 mg/dL (ref 0.3–1.2)
Total Protein: 7.1 g/dL (ref 6.5–8.1)

## 2021-02-12 LAB — RESP PANEL BY RT-PCR (FLU A&B, COVID) ARPGX2
Influenza A by PCR: NEGATIVE
Influenza B by PCR: NEGATIVE
SARS Coronavirus 2 by RT PCR: NEGATIVE

## 2021-02-12 LAB — RAPID URINE DRUG SCREEN, HOSP PERFORMED
Amphetamines: NOT DETECTED
Barbiturates: NOT DETECTED
Benzodiazepines: NOT DETECTED
Cocaine: NOT DETECTED
Opiates: NOT DETECTED
Tetrahydrocannabinol: NOT DETECTED

## 2021-02-12 LAB — I-STAT BETA HCG BLOOD, ED (MC, WL, AP ONLY): I-stat hCG, quantitative: <5 m[IU]/mL (ref <5)

## 2021-02-12 LAB — ACETAMINOPHEN LEVEL: Acetaminophen (Tylenol), Serum: <10 ug/mL — ABNORMAL LOW (ref 10–30)

## 2021-02-12 LAB — SALICYLATE LEVEL: Salicylate Lvl: <7 mg/dL — ABNORMAL LOW (ref 7.0–30.0)

## 2021-02-12 LAB — ETHANOL: Alcohol, Ethyl (B): <10 mg/dL (ref <10)
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
## 2021-02-12 NOTE — ED Provider Notes (Signed)
[facility] Admission Suicide Risk Assessment   Nursing information obtained from:   chart  Demographic Factors:  Adolescent or young adult and NA  Loss Factors: Decrease in vocational status  Historical Factors: Prior suicide attempts and Impulsivity  Risk Reduction Factors:   Responsible for children under 26 years of age, Sense of responsibility to family, Living with another person, especially a relative, and Positive social support   Total Time spent with patient: 15 minutes Principal Problem: <principal problem not specified> Diagnosis:  Active Problems:   * No active hospital problems. *  Subjective Data:  Per H&p Michaela Williams 25 y.o. patient presented to [facility] from [facility] via safe transport with complaints of "I don't want to be here, I am not suicidal".   Michaela Williams, 25 y.o., female patient seen face to face by this provider, consulted with Dr. Serafina Mitchell; and chart reviewed on 02/12/21.  On evaluation Michaela Williams reports last night she became overwhelmed after getting into an argument with her fianc.  States the argument was over him not helping her with the children.  States that she made a comment to her fianc that she was having suicidal ideations.  States he stayed awake with her last night because he was worried, but when he became too sleepy to stay awake he called 911. She was brought to [facility] ED.  States, "I do not want to die, I do not want to hurt myself, I want to be here for my kids, I just feel overwhelmed sometimes".  States she had no true intent of wanting to hurt herself reports a history of suicide 2 attempts when she was a teenager and 1' attempt as an adult.  On arrival to the [facility] patient states she does not want to be in the facility and she would like to go home.  States the RN at [facility] ED told her that if she did not come to the facility that the police would get involved and she would be forced to come to the facility.   During evaluation Michaela Williams is in  sitting position in no acute distress.  She makes Williams eye contact.  Her speech is clear, coherent, and normal rate and volume.  She is alert and oriented x4.  She is cooperative, but anxious.  States that she has been more depressed lately due to being so overwhelmed with raising her children, and she is in between jobs.  States she feels safe at home and has a Williams relationship with her fianc reports that she has a 40-year-old and a 27-year-old at home and it can be a hectic at times.  Objectively there is no evidence of psychosis/mania or delusional thinking.  Patient is able to converse coherently, goal directed thoughts, no distractibility, or pre-occupation.  She continues to deny suicidal/self-harm/homicidal ideation, psychosis, and paranoia.  Patient contracts for safety.  Denies access to firearms/weapons.  Patient answered question appropriately.    Denies alcohol and substance use.  Follows up with Geni Bers, therapist and Burt Ek, NP at [facility] behavioral health outpatient services. However, she has moved to [place] and will be given resources for psychiatric services for that area. States that she missed her appointment today.  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  States she stopped taking her Abilify  and Prozac in May.     Collateral: Michaela Williams, fianc.  Michaela Williams states he has no immediate safety concerns with patient returning home.  States the series  of events is exactly as the patient had explained.  States he does not think she wants to hurt herself, he knows that she is overwhelmed.  States he did not know what else to do so he called 911. Provided SI education.      CLINICAL FACTORS:   Severe Anxiety and/or Agitation Bipolar Disorder:   Depressive phase   Musculoskeletal: Strength & Muscle Tone: within normal limits Gait & Station: normal Patient leans: N/A  Psychiatric Specialty Exam:  Presentation  General Appearance: Appropriate for Environment;  Casual  Eye Contact:Williams  Speech:Clear and Coherent; Normal Rate  Speech Volume:Normal  Handedness:Right   Mood and Affect  Mood:Anxious; Depressed  Affect:Congruent   Thought Process  Thought Processes:Coherent  Descriptions of Associations:Intact  Orientation:Full (Time, Place and Person)  Thought Content:Logical  History of Schizophrenia/Schizoaffective disorder:No  Duration of Psychotic Symptoms:No data recorded Hallucinations:Hallucinations: None  Ideas of Reference:None  Suicidal Thoughts:Suicidal Thoughts: No  Homicidal Thoughts:Homicidal Thoughts: No   Sensorium  Memory:Immediate Williams; Recent Williams; Remote Williams  Judgment:Williams  Insight:Williams   Executive Functions  Concentration:Williams  Attention Span:Williams  [place] of Knowledge:Williams  Language:Williams   Psychomotor Activity  Psychomotor Activity:Psychomotor Activity: Normal   Assets  Assets:Communication Skills; Desire for Improvement; Financial Resources/Insurance; Housing; Intimacy; Leisure Time; Physical Health; Resilience; Social Support; Vocational/Educational; Transportation   Sleep  Sleep:Sleep: Fair Number of Hours of Sleep: 4    Physical Exam: Physical Exam Constitutional:      Appearance: Normal appearance. She is normal weight.  HENT:     Head: Normocephalic and atraumatic.  Pulmonary:     Effort: Pulmonary effort is normal.  Neurological:     Mental Status: She is alert.   Review of Systems  Constitutional:  Negative for chills and fever.  HENT:  Negative for hearing loss.   Eyes:  Negative for discharge and redness.  Respiratory:  Negative for cough.   Cardiovascular:  Negative for chest pain.  Gastrointestinal:  Negative for abdominal pain.  Musculoskeletal:  Negative for myalgias.  Neurological:  Negative for headaches.  Psychiatric/Behavioral:  Negative for hallucinations, substance abuse and suicidal ideas.   Blood pressure 116/85, pulse 85,  temperature 98.1 F (36.7 C), temperature source Oral, resp. rate 16, SpO2 100 %, currently breastfeeding. There is no height or weight on file to calculate BMI.   COGNITIVE FEATURES THAT CONTRIBUTE TO RISK:  None    SUICIDE RISK:   Minimal: No identifiable suicidal ideation.  Patients presenting with no risk factors but with morbid ruminations; may be classified as minimal risk based on the severity of the depressive symptoms  PLAN OF CARE:  Patient presented as a transfer to the ED on a voluntary basis after the police were called after she made a suicidal comment to her fiance after becoming overwhelmed. On my interview, she reports information as per H&P and  She denies SI/HI/AVH and is able to contract for safety. Collateral obtained from fiance reveals no imminent concerns for safety. Patient requesting discharge at this time. Labs unremarkable. Patient to be discharged    Ival Bible, MD 02/12/2021, 6:24 PM

## 2021-02-12 NOTE — ED Notes (Signed)
TTS being done at this time.

## 2021-02-12 NOTE — ED Provider Notes (Signed)
FBC/OBS ASAP Discharge Summary  Date and Time: 02/12/2021 4:03 PM  Name: Michaela Williams  MRN:  [id number]   Discharge Diagnoses:  Final diagnoses:  Adjustment disorder with mixed anxiety and depressed mood    Total Time spent with patient: 30 minutes  HPI: Michaela Williams 25 y.o. patient presented to [facility] from [facility] via safe transport with complaints of "I don't want to be here, I am not suicidal".   [name], 25 y.o., female patient seen face to face by this provider, consulted with Dr. Serafina Mitchell; and chart reviewed on 02/12/21.  On evaluation Michaela Williams reports last night she became overwhelmed after getting into an argument with her fianc.  States the argument was over him not helping her with the children.  States that she made a comment to her fianc that she was having suicidal ideations.  States he stayed awake with her last night because he was worried, but when he became too sleepy to stay awake he called 911. She was brought to [facility] ED.  States, "I do not want to die, I do not want to hurt myself, I want to be here for my kids, I just feel overwhelmed sometimes".  States she had no true intent of wanting to hurt herself reports a history of suicide 2 attempts when she was a teenager and 1' attempt as an adult.  On arrival to the [facility] patient states she does not want to be in the facility and she would like to go home.  States the RN at [facility] ED told her that if she did not come to the facility that the police would get involved and she would be forced to come to the facility.   During evaluation Michaela Williams is in sitting position in no acute distress.  She makes Williams eye contact.  Her speech is clear, coherent, and normal rate and volume.  She is alert and oriented x4.  She is cooperative, but anxious.  States that she has been more depressed lately due to being so overwhelmed with raising her children, and she is in between jobs.  States she feels safe at home and has a Williams relationship  with her fianc reports that she has a 8-year-old and a 93-year-old at home and it can be a hectic at times.  Objectively there is no evidence of psychosis/mania or delusional thinking.  Patient is able to converse coherently, goal directed thoughts, no distractibility, or pre-occupation.  She continues to deny suicidal/self-harm/homicidal ideation, psychosis, and paranoia.  Patient contracts for safety.  Denies access to firearms/weapons.  Patient answered question appropriately.    Denies alcohol and substance use.  Follows up with Geni Bers, therapist and Burt Ek, NP at [facility] behavioral health outpatient services. However, she has moved to [place] and will be given resources for psychiatric services for that area. States that she missed her appointment today.  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  States she stopped taking her Abilify  and Prozac in May.     Collateral: [name], fianc.  [name] states he has no immediate safety concerns with patient returning home.  States the series of events is exactly as the patient had explained.  States he does not think she wants to hurt herself, he knows that she is overwhelmed.  States he did not know what else to do so he called 911. Provided SI education.    The suicide prevention education provided includes the following:  Suicide risk factors Suicide prevention and interventions National Suicide Hotline telephone number [facility] assessment telephone number [facility] and/or Residential Mobile Crisis Unit telephone number   Request made of family/significant other to: Remove weapons (e.g., guns, rifles, knives), all items previously/currently identified as safety concern.   Remove drugs/medications (over the counter, prescriptions, illicit drugs), all items previously/currently identified as a safety concern. (States there are no guns or weapons in home   Past Psychiatric History: per patient report MDD,  GAD, Bipolar Past Medical History:  Past Medical History:  Diagnosis Date   Anemia    Anxiety    Asthma    in childhood    breast ca dx'd 05/2017   left   Breast cancer ([place]) 05/2017   dx at age 37   Depression    no meds in 3 yrs   Family history of breast cancer    Headache    migraines   Infection    UTI   Seizures ([place]) 2014   "seizure like activity", reaction to medicaiton, none since   UGI bleed 06/02/2017   Unwanted fertility 08/22/2020   BTL papers signed 08/20/20 Bilateral salpingectomy 09/2020    Past Surgical History:  Procedure Laterality Date   ESOPHAGOGASTRODUODENOSCOPY (EGD) WITH PROPOFOL N/A 06/03/2017   Procedure: ESOPHAGOGASTRODUODENOSCOPY (EGD) WITH PROPOFOL;  Surgeon: Ladene Artist, MD;  Location: [facility];  Service: Endoscopy;  Laterality: N/A;   IUD REMOVAL N/A 09/26/2020   Procedure: INTRAUTERINE DEVICE (IUD) REMOVAL;  Surgeon: Chancy Milroy, MD;  Location: [place];  Service: Gynecology;  Laterality: N/A;   LAPAROSCOPIC BILATERAL SALPINGECTOMY Bilateral 09/26/2020   Procedure: LAPAROSCOPIC BILATERAL SALPINGECTOMY;  Surgeon: Chancy Milroy, MD;  Location: [place];  Service: Gynecology;  Laterality: Bilateral;   Family History:  Family History  Adopted: Yes  Problem Relation Age of Onset   Mental illness Mother    Breast cancer Mother    Hypertension Maternal Grandmother    Diabetes Maternal Grandmother    Breast cancer Maternal Grandmother        d. in her 33s, "dealing with breast cancer all her life"   Breast cancer Maternal Aunt    Cervical cancer Maternal Aunt    Breast cancer Sister    Breast cancer Maternal Aunt    Breast cancer Maternal Aunt    Breast cancer Maternal Aunt    Breast cancer Maternal Aunt    Breast cancer Half-Sister        dx in her late 26s   Breast cancer Half-Sister        dx in her late 85s   Cervical cancer Half-Sister    Breast cancer Half-Sister        dx in her late  34s   Leukemia Nephew 4       currently in his teens   Family Psychiatric History: unknown  Social History:  Social History   Substance and Sexual Activity  Alcohol Use Not Currently   Comment: occasionally     Social History   Substance and Sexual Activity  Drug Use No    Social History   Socioeconomic History   Marital status: Single    Spouse name: Not on file   Number of children: 1   Years of education: Not on file   Highest education level: Not on file  Occupational History   Occupation: unemployed  Tobacco Use   Smoking status: Former    Types: Cigarettes  Quit date: 12/19/2014    Years since quitting: 6.1   Smokeless tobacco: Never   Tobacco comments:    age 29  Vaping Use   Vaping Use: Former  Substance and Sexual Activity   Alcohol use: Not Currently    Comment: occasionally   Drug use: No   Sexual activity: Not Currently    Birth control/protection: None  Other Topics Concern   Not on file  Social History Narrative   Not on file   Social Determinants of Health   Financial Resource Strain: Not on file  Food Insecurity: Not on file  Transportation Needs: Not on file  Physical Activity: Not on file  Stress: Not on file  Social Connections: Not on file   SDOH:  SDOH Screenings   Alcohol Screen: Not on file  Depression (PHQ2-9): Medium Risk   PHQ-2 Score: 9  Financial Resource Strain: Not on file  Food Insecurity: Not on file  Housing: Not on file  Physical Activity: Not on file  Social Connections: Not on file  Stress: Not on file  Tobacco Use: Medium Risk   Smoking Tobacco Use: Former   Smokeless Tobacco Use: Never  Transportation Needs: Not on file    Tobacco Cessation:  N/A, patient does not currently use tobacco products  Current Medications:  No current facility-administered medications for this encounter.   Current Outpatient Medications  Medication Sig Dispense Refill   ARIPiprazole (ABILIFY) 20 MG tablet Take 1 tablet  (20 mg total) by mouth daily. (Patient not taking: Reported on 02/12/2021) 30 tablet 2   FLUoxetine (PROZAC) 20 MG capsule Take 1 capsule (20 mg total) by mouth daily. (Patient not taking: Reported on 02/12/2021) 30 capsule 2    PTA Medications: (Not in a hospital admission)   Musculoskeletal  Strength & Muscle Tone: within normal limits Gait & Station: normal Patient leans: N/A  Psychiatric Specialty Exam  Presentation  General Appearance: Appropriate for Environment; Casual  Eye Contact:Williams  Speech:Clear and Coherent; Normal Rate  Speech Volume:Normal  Handedness:Right   Mood and Affect  Mood:Anxious; Depressed  Affect:Congruent   Thought Process  Thought Processes:Coherent  Descriptions of Associations:Intact  Orientation:Full (Time, Place and Person)  Thought Content:Logical  Diagnosis of Schizophrenia or Schizoaffective disorder in past: No    Hallucinations:Hallucinations: None  Ideas of Reference:None  Suicidal Thoughts:Suicidal Thoughts: No  Homicidal Thoughts:Homicidal Thoughts: No   Sensorium  Memory:Immediate Williams; Recent Williams; Remote Williams  Judgment:Williams  Insight:Williams   Executive Functions  Concentration:Williams  Attention Span:Williams  [place] of Knowledge:Williams  Language:Williams   Psychomotor Activity  Psychomotor Activity:Psychomotor Activity: Normal   Assets  Assets:Communication Skills; Desire for Improvement; Financial Resources/Insurance; Housing; Intimacy; Leisure Time; Physical Health; Resilience; Social Support; Vocational/Educational; Transportation   Sleep  Sleep:Sleep: Fair Number of Hours of Sleep: 4   Nutritional Assessment (For OBS and FBC admissions only) Has the patient had a weight loss or gain of 10 pounds or more in the last 3 months?: No Has the patient had a decrease in food intake/or appetite?: Yes Does the patient have dental problems?: No Does the patient have eating habits or behaviors that may  be indicators of an eating disorder including binging or inducing vomiting?: No Has the patient recently lost weight without trying?: No Has the patient been eating poorly because of a decreased appetite?: Yes Malnutrition Screening Tool Score: 1   Physical Exam  Physical Exam Vitals and nursing note reviewed.  Constitutional:      General: She is  not in acute distress.    Appearance: Normal appearance. She is not ill-appearing.  HENT:     Head: Normocephalic.  Eyes:     General:        Right eye: No discharge.        Left eye: No discharge.     Conjunctiva/sclera: Conjunctivae normal.  Cardiovascular:     Rate and Rhythm: Normal rate.  Pulmonary:     Effort: Pulmonary effort is normal.  Musculoskeletal:        General: Normal range of motion.     Cervical back: Normal range of motion.  Skin:    General: Skin is warm and dry.  Neurological:     Mental Status: She is alert and oriented to person, place, and time.  Psychiatric:        Attention and Perception: Attention and perception normal.        Mood and Affect: Mood is anxious and depressed.        Speech: Speech normal.        Behavior: Behavior normal. Behavior is cooperative.        Thought Content: Thought content normal.        Cognition and Memory: Cognition normal.        Judgment: Judgment normal.   Review of Systems  Constitutional: Negative.  Negative for chills and fever.  HENT: Negative.  Negative for hearing loss.   Eyes: Negative.   Respiratory: Negative.  Negative for cough.   Cardiovascular: Negative.  Negative for chest pain.  Musculoskeletal: Negative.   Neurological: Negative.   Psychiatric/Behavioral:  Positive for depression. The patient is nervous/anxious.   Blood pressure 116/85, pulse 85, temperature 98.1 F (36.7 C), temperature source Oral, resp. rate 16, SpO2 100 %, currently breastfeeding. There is no height or weight on file to calculate BMI.  Demographic Factors:  Adolescent or  young adult and Caucasian  Loss Factors: NA  Historical Factors: Prior suicide attempts  Risk Reduction Factors:   Responsible for children under 59 years of age, Sense of responsibility to family, Living with another person, especially a relative, Positive social support, and Positive coping skills or problem solving skills  Continued Clinical Symptoms:  Severe Anxiety and/or Agitation Bipolar Disorder:   Depressive phase Depression:   Impulsivity  Cognitive Features That Contribute To Risk:  None    Suicide Risk:  Minimal: No identifiable suicidal ideation.  Patients presenting with no risk factors but with morbid ruminations; may be classified as minimal risk based on the severity of the depressive symptoms  Plan Of Care/Follow-up recommendations:  Activity:  as tolerated  Diet:  Regular  Disposition:   Patient denies suicidal/homicidal ideations.  She does not appear to be responding to any internal stimuli.  Denies AVH/psychosis/delusions.  Patient contracts for safety.  Patient has outpatient psychiatric services in place.  Collateral obtained from [facility].  [name] has no immediate safety concerns with patient returning home.  Patient will be discharged home today.  Based on my evaluation the patient does not appear to have an emergency medical condition. Discharge patient Resources provided for outpatient psychiatric services in [place].   No evidence of imminent risk to self or others at present.    Patient does not meet criteria for psychiatric inpatient admission. Discussed crisis plan, support from social network, calling 911, coming to the Emergency Department, and calling Suicide Hotline.   Revonda Humphrey, NP 02/12/2021, 4:03 PM

## 2021-02-12 NOTE — ED Notes (Signed)
Pt checked all her belongings including her bracelet and ring.

## 2021-02-12 NOTE — ED Triage Notes (Signed)
Patient from home, having suicidal ideation with no specific plan at this time.  History of depression and anxiety.  Has been seen for this multiple times before.

## 2021-02-12 NOTE — Discharge Instructions (Addendum)

## 2021-02-12 NOTE — ED Provider Notes (Signed)
[facility] Discharge Suicide Risk Assessment   Principal Problem: <principal problem not specified> Discharge Diagnoses: Active Problems:   * No active hospital problems. *   Total Time spent with patient: 15 minutes  Musculoskeletal: Strength & Muscle Tone: within normal limits Gait & Station: normal Patient leans: N/A  Psychiatric Specialty Exam  Presentation  General Appearance: Appropriate for Environment; Casual  Eye Contact:Good  Speech:Clear and Coherent; Normal Rate  Speech Volume:Normal  Handedness:Right   Mood and Affect  Mood:Anxious; Depressed  Duration of Depression Symptoms: Greater than two weeks  Affect:Congruent   Thought Process  Thought Processes:Coherent  Descriptions of Associations:Intact  Orientation:Full (Time, Place and Person)  Thought Content:Logical  History of Schizophrenia/Schizoaffective disorder:No  Duration of Psychotic Symptoms:No data recorded Hallucinations:Hallucinations: None  Ideas of Reference:None  Suicidal Thoughts:Suicidal Thoughts: No  Homicidal Thoughts:Homicidal Thoughts: No   Sensorium  Memory:Immediate Good; Recent Good; Remote Good  Judgment:Good  Insight:Good   Executive Functions  Concentration:Good  Attention Span:Good  [place] of Knowledge:Good  Language:Good   Psychomotor Activity  Psychomotor Activity:Psychomotor Activity: Normal   Assets  Assets:Communication Skills; Desire for Improvement; Financial Resources/Insurance; Housing; Intimacy; Leisure Time; Physical Health; Resilience; Social Support; Vocational/Educational; Transportation   Sleep  Sleep:Sleep: Fair Number of Hours of Sleep: 4   Physical Exam:   Physical Exam Constitutional:      Appearance: Normal appearance. She is normal weight. HENT:    Head: Normocephalic and atraumatic. Pulmonary:    Effort: Pulmonary effort is normal. Neurological:    Mental Status: She is alert.    Review of  Systems Constitutional:  Negative for chills and fever. HENT:  Negative for hearing loss.   Eyes:  Negative for discharge and redness. Respiratory:  Negative for cough.   Cardiovascular:  Negative for chest pain. Gastrointestinal:  Negative for abdominal pain. Musculoskeletal:  Negative for myalgias. Neurological:  Negative for headaches. Psychiatric/Behavioral:   Blood pressure 116/85, pulse 85, temperature 98.1 F (36.7 C), temperature source Oral, resp. rate 16, SpO2 100 %, currently breastfeeding. There is no height or weight on file to calculate BMI.  Mental Status Per Nursing Assessment::   On Admission:     Demographic Factors:  Adolescent or young adult and Unemployed  Loss Factors: Decrease in vocational status  Historical Factors: Prior suicide attempts  Risk Reduction Factors:   Responsible for children under 37 years of age, Sense of responsibility to family, Living with another person, especially a relative, and Positive social support  Continued Clinical Symptoms:  Severe Anxiety and/or Agitation Bipolar Disorder:   Depressive phase  Cognitive Features That Contribute To Risk:  None    Suicide Risk:  Minimal: No identifiable suicidal ideation.  Patients presenting with no risk factors but with morbid ruminations; may be classified as minimal risk based on the severity of the depressive symptoms   Follow-up [place]. Call on 02/13/2021.   Specialty: Behavioral Health Why: Please contact to establish outpatient medication management and therapy services. Please be sure to bring your discharge paperwork, including a list of medications. Contact information: [address] [place] [phone]                Plan Of Care/Follow-up recommendations:  Activity:  as tolerated Diet:  regular Other:     Patient is instructed prior to discharge to: Take all  medications as prescribed by his/her mental healthcare provider. Report any adverse effects and or  reactions from the medicines to his/her outpatient provider promptly. Patient has been instructed & cautioned: To not engage in alcohol and or illegal drug use while on prescription medicines. In the event of worsening symptoms, patient is instructed to call the crisis hotline, 911 and or go to the nearest ED for appropriate evaluation and treatment of symptoms. To follow-up with his/her primary care provider for your other medical issues, concerns and or health care needs.     Ival Bible, MD 02/12/2021, 6:32 PM

## 2021-02-12 NOTE — Progress Notes (Signed)
Patient has been accepted to [facility] per Dr. Serafina Mitchell for continued oberservation. CSW informed NP and ED RN.   Mariea Clonts, MSW, LCSW-A  9:36 AM 02/12/2021

## 2021-02-12 NOTE — ED Provider Notes (Signed)
Behavioral Health Admission H&P [facility] & OBS)  Date: 02/12/21 Patient Name: Michaela Williams MRN: [id number] Chief Complaint:  Chief Complaint  Patient presents with   Urgent Emergent Eval      Diagnoses:  Final diagnoses:  Adjustment disorder with mixed anxiety and depressed mood    HPI: Michaela Williams Good 25 y.o. patient presented to [facility] from [facility] via safe transport with complaints of "I don't want to be here, I am not suicidal".  Michaela Williams, 25 y.o., female patient seen face to face by this provider, consulted with Dr. Serafina Mitchell; and chart reviewed on 02/12/21.  On evaluation Michaela Williams reports last night she became overwhelmed after getting into an argument with her fianc.  States the argument was over him not helping her with the children.  States that she made a comment to her fianc that she was having suicidal ideations.  States he stayed awake with her last night because he was worried, but when he became too sleepy to stay awake he called 911. She was brought to [facility] ED.  States, "I do not want to die, I do not want to hurt myself, I want to be here for my kids, I just feel overwhelmed sometimes".  States she had no true intent of wanting to hurt herself reports a history of suicide 2 attempts when she was a teenager and 1' attempt as an adult.  On arrival to the [facility] patient states she does not want to be in the facility and she would like to go home.  States the RN at [facility] ED told her that if she did not come to the facility that the police would get involved and she would be forced to come to the facility.  During evaluation Michaela Williams is in sitting position in no acute distress.  She makes good eye contact.  Her speech is clear, coherent, and normal rate and volume.  She is alert and oriented x4.  She is cooperative, but anxious.  States that she has been more depressed lately due to being so overwhelmed with raising her children, and she is in between jobs.  States she feels safe at  home and has a good relationship with her fianc reports that she has a 21-year-old and a 52-year-old at home and it can be a hectic at times.  Objectively there is no evidence of psychosis/mania or delusional thinking.  Patient is able to converse coherently, goal directed thoughts, no distractibility, or pre-occupation.  She continues to deny suicidal/self-harm/homicidal ideation, psychosis, and paranoia.  Patient contracts for safety.  Denies access to firearms/weapons.  Patient answered question appropriately.   Denies alcohol and substance use.  Follows up with Michaela Williams, therapist and Michaela Ek, NP at [facility] behavioral health outpatient services. However, she has moved to [place] and will be given resources for psychiatric services for that area. States that she missed her appointment today.  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  States she stopped taking her Abilify  and Prozac in May.    Collateral: Michaela Williams, fianc.  Michaela Williams states he has no immediate safety concerns with patient returning home.  States the series of events is exactly as the patient had explained.  States he does not think she wants to hurt herself, he knows that she is overwhelmed.  States he did not know what else to do so he called 911. Provided SI education.   The suicide prevention education provided includes the following:  Suicide risk factors Suicide prevention and interventions National Suicide Hotline telephone number [facility] assessment telephone number [facility] and/or Residential Mobile Crisis Unit telephone number   Request made of family/significant other to: Remove weapons (e.g., guns, rifles, knives), all items previously/currently identified as safety concern.   Remove drugs/medications (over the counter, prescriptions, illicit drugs), all items previously/currently identified as a safety concern. (States there are no guns or weapons in home)    [place] 2-9:   [place] ED from 02/12/2021 in [facility] Video Visit from 11/12/2020 in [facility] Video Visit from 08/15/2020 in [facility]  Thoughts that you would be better off dead, or of hurting yourself in some way Several days Not at all Not at all  PHQ-9 Total Score '9 4 13       '$ Flowsheet Row ED from 02/12/2021 in [facility] Most recent reading at 02/12/2021  1:10 PM ED from 02/12/2021 in [place] Most recent reading at 02/12/2021 10:17 AM Video Visit from 11/12/2020 in [facility] Most recent reading at 11/12/2020  2:39 PM  C-SSRS RISK CATEGORY Error: Q3, 4, or 5 should not be populated when Q2 is No Error: Q3, 4, or 5 should not be populated when Q2 is No No Risk        Total Time spent with patient: 30 minutes  Musculoskeletal  Strength & Muscle Tone: within normal limits Gait & Station: normal Patient leans: N/A  Psychiatric Specialty Exam  Presentation General Appearance: Appropriate for Environment; Casual  Eye Contact:Good  Speech:Clear and Coherent; Normal Rate  Speech Volume:Normal  Handedness:Right   Mood and Affect  Mood:Anxious; Depressed  Affect:Congruent   Thought Process  Thought Processes:Coherent  Descriptions of Associations:Intact  Orientation:Full (Time, Place and Person)  Thought Content:Logical  Diagnosis of Schizophrenia or Schizoaffective disorder in past: No   Hallucinations:Hallucinations: None  Ideas of Reference:None  Suicidal Thoughts:Suicidal Thoughts: No  Homicidal Thoughts:Homicidal Thoughts: No   Sensorium  Memory:Immediate Good; Recent Good; Remote Good  Judgment:Good  Insight:Good   Executive Functions  Concentration:Good  Attention Span:Good  Recall:Good  Fund of Knowledge:Good  Language:Good   Psychomotor Activity   Psychomotor Activity:Psychomotor Activity: Normal   Assets  Assets:Communication Skills; Desire for Improvement; Financial Resources/Insurance; Housing; Intimacy; Leisure Time; Physical Health; Resilience; Social Support; Vocational/Educational; Transportation   Sleep  Sleep:Sleep: Fair Number of Hours of Sleep: 4   Nutritional Assessment (For OBS and FBC admissions only) Has the patient had a weight loss or gain of 10 pounds or more in the last 3 months?: No Has the patient had a decrease in food intake/or appetite?: Yes Does the patient have dental problems?: No Does the patient have eating habits or behaviors that may be indicators of an eating disorder including binging or inducing vomiting?: No Has the patient recently lost weight without trying?: No Has the patient been eating poorly because of a decreased appetite?: Yes Malnutrition Screening Tool Score: 1   Physical Exam Vitals and nursing note reviewed.  Constitutional:      General: She is not in acute distress.    Appearance: Normal appearance. She is not ill-appearing.  HENT:     Head: Normocephalic.  Eyes:     General:        Right eye: No discharge.        Left eye: No discharge.     Conjunctiva/sclera: Conjunctivae normal.  Cardiovascular:     Rate and Rhythm: Normal rate.  Pulmonary:     Effort: Pulmonary effort is normal.  Musculoskeletal:        General: Normal range of motion.     Cervical back: Normal range of motion.  Skin:    General: Skin is warm and dry.  Neurological:     Mental Status: She is alert and oriented to person, place, and time.  Psychiatric:        Attention and Perception: Attention and perception normal.        Mood and Affect: Affect normal. Mood is anxious and depressed.        Speech: Speech normal.        Behavior: Behavior normal. Behavior is cooperative.        Thought Content: Thought content normal.        Cognition and Memory: Cognition normal.        Judgment:  Judgment normal.   Review of Systems  Constitutional: Negative.  Negative for chills and fever.  HENT: Negative.  Negative for hearing loss.   Eyes: Negative.   Respiratory: Negative.  Negative for cough.   Cardiovascular: Negative.  Negative for chest pain.  Genitourinary: Negative.   Musculoskeletal: Negative.   Skin: Negative.   Neurological: Negative.   Psychiatric/Behavioral:  Positive for depression. The patient is nervous/anxious.    Blood pressure 116/85, pulse 85, temperature 98.1 F (36.7 C), temperature source Oral, resp. rate 16, SpO2 100 %, currently breastfeeding. There is no height or weight on file to calculate BMI.  Past Psychiatric History: patient reports MDD, GAD,  Bipolar Disorder  Is the patient at risk to self? No  Has the patient been a risk to self in the past 6 months? No .    Has the patient been a risk to self within the distant past? Yes   Is the patient a risk to others? No   Has the patient been a risk to others in the past 6 months? No   Has the patient been a risk to others within the distant past? No   Past Medical History:  Past Medical History:  Diagnosis Date   Anemia    Anxiety    Asthma    in childhood    breast ca dx'd 05/2017   left   Breast cancer ([place]) 05/2017   dx at age 53   Depression    no meds in 3 yrs   Family history of breast cancer    Headache    migraines   Infection    UTI   Seizures ([place]) 2014   "seizure like activity", reaction to medicaiton, none since   UGI bleed 06/02/2017   Unwanted fertility 08/22/2020   BTL papers signed 08/20/20 Bilateral salpingectomy 09/2020    Past Surgical History:  Procedure Laterality Date   ESOPHAGOGASTRODUODENOSCOPY (EGD) WITH PROPOFOL N/A 06/03/2017   Procedure: ESOPHAGOGASTRODUODENOSCOPY (EGD) WITH PROPOFOL;  Surgeon: Ladene Artist, MD;  Location: [facility];  Service: Endoscopy;  Laterality: N/A;   IUD REMOVAL N/A 09/26/2020   Procedure: INTRAUTERINE DEVICE (IUD) REMOVAL;   Surgeon: Chancy Milroy, MD;  Location: [facility];  Service: Gynecology;  Laterality: N/A;   LAPAROSCOPIC BILATERAL SALPINGECTOMY Bilateral 09/26/2020   Procedure: LAPAROSCOPIC BILATERAL SALPINGECTOMY;  Surgeon: Chancy Milroy, MD;  Location: [facility];  Service: Gynecology;  Laterality: Bilateral;    Family History:  Family History  Adopted: Yes  Problem Relation Age of Onset  Mental illness Mother    Breast cancer Mother    Hypertension Maternal Grandmother    Diabetes Maternal Grandmother    Breast cancer Maternal Grandmother        d. in her 53s, "dealing with breast cancer all her life"   Breast cancer Maternal Aunt    Cervical cancer Maternal Aunt    Breast cancer Sister    Breast cancer Maternal Aunt    Breast cancer Maternal Aunt    Breast cancer Maternal Aunt    Breast cancer Maternal Aunt    Breast cancer Half-Sister        dx in her late 29s   Breast cancer Half-Sister        dx in her late 17s   Cervical cancer Half-Sister    Breast cancer Half-Sister        dx in her late 59s   Leukemia Nephew 4       currently in his teens    Social History:  Social History   Socioeconomic History   Marital status: Single    Spouse name: Not on file   Number of children: 1   Years of education: Not on file   Highest education level: Not on file  Occupational History   Occupation: unemployed  Tobacco Use   Smoking status: Former    Types: Cigarettes    Quit date: 12/19/2014    Years since quitting: 6.1   Smokeless tobacco: Never   Tobacco comments:    age 39  Vaping Use   Vaping Use: Former  Substance and Sexual Activity   Alcohol use: Not Currently    Comment: occasionally   Drug use: No   Sexual activity: Not Currently    Birth control/protection: None  Other Topics Concern   Not on file  Social History Narrative   Not on file   Social Determinants of Health   Financial Resource Strain: Not on file  Food Insecurity:  Not on file  Transportation Needs: Not on file  Physical Activity: Not on file  Stress: Not on file  Social Connections: Not on file  Intimate Partner Violence: Not on file    SDOH:  SDOH Screenings   Alcohol Screen: Not on file  Depression (PHQ2-9): Medium Risk   PHQ-2 Score: 9  Financial Resource Strain: Not on file  Food Insecurity: Not on file  Housing: Not on file  Physical Activity: Not on file  Social Connections: Not on file  Stress: Not on file  Tobacco Use: Medium Risk   Smoking Tobacco Use: Former   Smokeless Tobacco Use: Never  Transportation Needs: Not on file    Last Labs:  Admission on 02/12/2021, Discharged on 02/12/2021  Component Date Value Ref Range Status   WBC 02/12/2021 9.4  4.0 - 10.5 K/uL Final   RBC 02/12/2021 4.31  3.87 - 5.11 MIL/uL Final   Hemoglobin 02/12/2021 12.6  12.0 - 15.0 g/dL Final   HCT 02/12/2021 40.2  36.0 - 46.0 % Final   MCV 02/12/2021 93.3  80.0 - 100.0 fL Final   MCH 02/12/2021 29.2  26.0 - 34.0 pg Final   MCHC 02/12/2021 31.3  30.0 - 36.0 g/dL Final   RDW 02/12/2021 12.8  11.5 - 15.5 % Final   Platelets 02/12/2021 315  150 - 400 K/uL Final   nRBC 02/12/2021 0.0  0.0 - 0.2 % Final   Neutrophils Relative % 02/12/2021 72  % Final   Neutro Abs 02/12/2021 6.8  1.7 -  7.7 K/uL Final   Lymphocytes Relative 02/12/2021 19  % Final   Lymphs Abs 02/12/2021 1.8  0.7 - 4.0 K/uL Final   Monocytes Relative 02/12/2021 8  % Final   Monocytes Absolute 02/12/2021 0.7  0.1 - 1.0 K/uL Final   Eosinophils Relative 02/12/2021 1  % Final   Eosinophils Absolute 02/12/2021 0.1  0.0 - 0.5 K/uL Final   Basophils Relative 02/12/2021 0  % Final   Basophils Absolute 02/12/2021 0.0  0.0 - 0.1 K/uL Final   Immature Granulocytes 02/12/2021 0  % Final   Abs Immature Granulocytes 02/12/2021 0.03  0.00 - 0.07 K/uL Final   Performed at [facility] [address] [place], [place] [address]   Sodium 02/12/2021 135  135 - 145 mmol/L Final   Potassium  02/12/2021 3.8  3.5 - 5.1 mmol/L Final   Chloride 02/12/2021 103  98 - 111 mmol/L Final   CO2 02/12/2021 24  22 - 32 mmol/L Final   Glucose, Bld 02/12/2021 119 (A) 70 - 99 mg/dL Final   Glucose reference range applies only to samples taken after fasting for at least 8 hours.   BUN 02/12/2021 13  6 - 20 mg/dL Final   Creatinine, Ser 02/12/2021 0.70  0.44 - 1.00 mg/dL Final   Calcium 02/12/2021 9.2  8.9 - 10.3 mg/dL Final   Total Protein 02/12/2021 7.1  6.5 - 8.1 g/dL Final   Albumin 02/12/2021 3.7  3.5 - 5.0 g/dL Final   AST 02/12/2021 15  15 - 41 U/L Final   ALT 02/12/2021 16  0 - 44 U/L Final   Alkaline Phosphatase 02/12/2021 79  38 - 126 U/L Final   Total Bilirubin 02/12/2021 0.4  0.3 - 1.2 mg/dL Final   GFR, Estimated 02/12/2021 >60  >60 mL/min Final   Comment: (NOTE) Calculated using the CKD-EPI Creatinine Equation (2021)    Anion gap 02/12/2021 8  5 - 15 Final   Performed at [facility] [address], [place], [place] [address]   Acetaminophen (Tylenol), Serum 02/12/2021 <10 (A) 10 - 30 ug/mL Final   Comment: (NOTE) Therapeutic concentrations vary significantly. A range of 10-30 ug/mL  may be an effective concentration for many patients. However, some  are best treated at concentrations outside of this range. Acetaminophen concentrations >150 ug/mL at 4 hours after ingestion  and >50 ug/mL at 12 hours after ingestion are often associated with  toxic reactions.  Performed at [facility], [place] [address] [place], [place] [id number]    Salicylate Lvl [id number] <7.0 (A) 7.0 - 30.0 mg/dL Final   Performed at [facility] [address], [place], [place] [address]   Opiates 02/12/2021 NONE DETECTED  NONE DETECTED Final   Cocaine 02/12/2021 NONE DETECTED  NONE DETECTED Final   Benzodiazepines 02/12/2021 NONE DETECTED  NONE DETECTED Final   Amphetamines 02/12/2021 NONE DETECTED  NONE DETECTED Final   Tetrahydrocannabinol 02/12/2021 NONE DETECTED  NONE  DETECTED Final   Barbiturates 02/12/2021 NONE DETECTED  NONE DETECTED Final   Comment: (NOTE) DRUG SCREEN FOR MEDICAL PURPOSES ONLY.  IF CONFIRMATION IS NEEDED FOR ANY PURPOSE, NOTIFY LAB WITHIN 5 DAYS.  LOWEST DETECTABLE LIMITS FOR URINE DRUG SCREEN Drug Class                     Cutoff (ng/mL) Amphetamine and metabolites    1000 Barbiturate and metabolites    200 Benzodiazepine  [id number] Tricyclics and metabolites     300 Opiates and metabolites        300 Cocaine and metabolites        300 THC                            50 Performed at [facility], [place] [address] [place], [place] [address]    Alcohol, Ethyl (Williams) 02/12/2021 <10  <10 mg/dL Final   Comment: (NOTE) Lowest detectable limit for serum alcohol is 10 mg/dL.  For medical purposes only. Performed at [facility], [place] [address], [place], [place] [address]    SARS Coronavirus 2 by RT PCR 02/12/2021 NEGATIVE  NEGATIVE Final   Comment: (NOTE) SARS-CoV-2 target nucleic acids are NOT DETECTED.  The SARS-CoV-2 RNA is generally detectable in upper respiratory specimens during the acute phase of infection. The lowest concentration of SARS-CoV-2 viral copies this assay can detect is 138 copies/mL. A negative result does not preclude SARS-Cov-2 infection and should not be used as the sole basis for treatment or other patient management decisions. A negative result may occur with  improper specimen collection/handling, submission of specimen other than nasopharyngeal swab, presence of viral mutation(s) within the areas targeted by this assay, and inadequate number of viral copies(<138 copies/mL). A negative result must be combined with clinical observations, patient history, and epidemiological information. The expected result is Negative.  Fact Sheet for Patients:  EntrepreneurPulse.com.au  Fact Sheet for Healthcare Providers:  IncredibleEmployment.be  This  test is no                          t yet approved or cleared by the [place] FDA and  has been authorized for detection and/or diagnosis of SARS-CoV-2 by FDA under an Emergency Use Authorization (EUA). This EUA will remain  in effect (meaning this test can be used) for the duration of the COVID-19 declaration under Section 564(Williams)(1) of the Act, 21 U.S.C.section 360bbb-3(Williams)(1), unless the authorization is terminated  or revoked sooner.       Influenza A by PCR 02/12/2021 NEGATIVE  NEGATIVE Final   Influenza Williams by PCR 02/12/2021 NEGATIVE  NEGATIVE Final   Comment: (NOTE) The Xpert Xpress SARS-CoV-2/FLU/RSV plus assay is intended as an aid in the diagnosis of influenza from Nasopharyngeal swab specimens and should not be used as a sole basis for treatment. Nasal washings and aspirates are unacceptable for Xpert Xpress SARS-CoV-2/FLU/RSV testing.  Fact Sheet for Patients: EntrepreneurPulse.com.au  Fact Sheet for Healthcare Providers: IncredibleEmployment.be  This test is not yet approved or cleared by the [place] FDA and has been authorized for detection and/or diagnosis of SARS-CoV-2 by FDA under an Emergency Use Authorization (EUA). This EUA will remain in effect (meaning this test can be used) for the duration of the COVID-19 declaration under Section 564(Williams)(1) of the Act, 21 U.S.C. section 360bbb-3(Williams)(1), unless the authorization is terminated or revoked.  Performed at [facility], [place] [address] [place], [place] [address]    I-stat hCG, quantitative 02/12/2021 <5.0  <5 mIU/mL Final   Comment 3 02/12/2021          Final   Comment:   GEST. AGE      CONC.  (mIU/mL)   <=1 WEEK        5 - 50     2 WEEKS       50 -  500     3 WEEKS       100 - 10,000     4 WEEKS     1,000 - 30,000        FEMALE AND NON-PREGNANT FEMALE:     LESS THAN 5 mIU/mL   Admission on 09/26/2020, Discharged on 09/26/2020  Component Date Value Ref  Range Status   WBC 09/24/2020 7.4  4.0 - 10.5 K/uL Final   RBC 09/24/2020 4.75  3.87 - 5.11 MIL/uL Final   Hemoglobin 09/24/2020 14.4  12.0 - 15.0 g/dL Final   HCT 09/24/2020 44.0  36.0 - 46.0 % Final   MCV 09/24/2020 92.6  80.0 - 100.0 fL Final   MCH 09/24/2020 30.3  26.0 - 34.0 pg Final   MCHC 09/24/2020 32.7  30.0 - 36.0 g/dL Final   RDW 09/24/2020 12.5  11.5 - 15.5 % Final   Platelets 09/24/2020 313  150 - 400 K/uL Final   nRBC 09/24/2020 0.0  0.0 - 0.2 % Final   Performed at [facility], [place] [address] [place], [place] [address]   Sodium 09/24/2020 136  135 - 145 mmol/L Final   Potassium 09/24/2020 4.3  3.5 - 5.1 mmol/L Final   Chloride 09/24/2020 103  98 - 111 mmol/L Final   CO2 09/24/2020 24  22 - 32 mmol/L Final   Glucose, Bld 09/24/2020 108 (A) 70 - 99 mg/dL Final   Glucose reference range applies only to samples taken after fasting for at least 8 hours.   BUN 09/24/2020 13  6 - 20 mg/dL Final   Creatinine, Ser 09/24/2020 0.71  0.44 - 1.00 mg/dL Final   Calcium 09/24/2020 9.3  8.9 - 10.3 mg/dL Final   GFR, Estimated 09/24/2020 >60  >60 mL/min Final   Comment: (NOTE) Calculated using the CKD-EPI Creatinine Equation (2021)    Anion gap 09/24/2020 9  5 - 15 Final   Performed at [facility], [place] [address], [place], [place] [address]   SURGICAL PATHOLOGY 09/26/2020    Final-Edited                   Value:SURGICAL PATHOLOGY CASE: [id number] PATIENT: Inger Ida Surgical Pathology Report     Clinical History: Undesired fertility (cm)     FINAL MICROSCOPIC DIAGNOSIS:  A. FALLOPIAN TUBES, BILATERAL, SALPINGECTOMY: - Benign fallopian tube tissue.   GROSS DESCRIPTION:  The specimen is received in formalin and consists of multiple pieces of fallopian tube, ranging from 0.7 cm to 6.0 cm in length x 0.6 cm in diameter.  The largest segment consists of an intact fimbriated fallopian tube, with a red-purple and smooth serosa.  Sectioning reveals a tan  cut surface with a pinpoint lumen.  Two additional segments of tissue are tubular, measuring 2.5 and 4.3 cm in length and ranging from 0.4 to 0.6 cm in diameter.  Sectioning reveals a tan cut surface and a pinpoint lumen.  Two additional segments of tissue are grossly consistent with fimbria.  Representative sections are submitted in 2 cassettes. 1 = intact fimbriated fallopian tube. 2 = additional fallopian tube                          to include fimbria Craig Staggers 09/27/2020).    Final Diagnosis performed by Gillie Manners, MD.   Electronically signed 09/28/2020 Technical and / or Professional components performed at [facility]. [facility], [place] [address], [place],  [address].  Immunohistochemistry Technical component (if applicable) was performed at [facility]. [address] [place], [place], [place] [address].   IMMUNOHISTOCHEMISTRY DISCLAIMER (if applicable): Some of these immunohistochemical stains may have been developed and the performance characteristics determine by [facility]. Some may not have been cleared or approved by the U.S. Food and Drug Administration. The FDA has determined that such clearance or approval is not necessary. This test is used for clinical purposes. It should not be regarded as investigational or for research. This laboratory is certified under the [place] (CLIA-88) as qualified to                          perform high complexity clinical laboratory testing.  The controls stained appropriately.   Hospital Outpatient Visit on 09/24/2020  Component Date Value Ref Range Status   Preg Test, Ur 09/24/2020 NEGATIVE  NEGATIVE Final   Comment:        THE SENSITIVITY OF THIS METHODOLOGY IS >24 mIU/mL   Hospital Outpatient Visit on 09/24/2020  Component Date Value Ref Range Status   SARS Coronavirus 2 09/24/2020 NEGATIVE  NEGATIVE Final   Comment: (NOTE) SARS-CoV-2  target nucleic acids are NOT DETECTED.  The SARS-CoV-2 RNA is generally detectable in upper and lower respiratory specimens during the acute phase of infection. Negative results do not preclude SARS-CoV-2 infection, do not rule out co-infections with other pathogens, and should not be used as the sole basis for treatment or other patient management decisions. Negative results must be combined with clinical observations, patient history, and epidemiological information. The expected result is Negative.  Fact Sheet for Patients: SugarRoll.be  Fact Sheet for Healthcare Providers: https://www.woods-mathews.com/  This test is not yet approved or cleared by the [place] FDA and  has been authorized for detection and/or diagnosis of SARS-CoV-2 by FDA under an Emergency Use Authorization (EUA). This EUA will remain  in effect (meaning this test can be used) for the duration of the COVID-19 declaration under Se                          ction 564(Williams)(1) of the Act, 21 U.S.C. section 360bbb-3(Williams)(1), unless the authorization is terminated or revoked sooner.  Performed at [facility], [place] [address], [place], [place] [address]     Allergies: Quetiapine fumarate, Flexeril [cyclobenzaprine], Lithium, and Lexapro [escitalopram]  PTA Medications: (Not in a hospital admission)   Medical Decision Making  Patient denies suicidal/homicidal ideations.  She does not appear to be responding to any internal stimuli.  Denies AVH/psychosis/delusions.  Patient contracts for safety.  Patient has outpatient psychiatric services in place.  Collateral obtained from [place], [place].  Michaela Williams has no immediate safety concerns with patient returning home.  Patient will be discharged home today.    Recommendations  Based on my evaluation the patient does not appear to have an emergency medical condition. Discharge patient Resources provided for outpatient  psychiatric services in [place].  No evidence of imminent risk to self or others at present.    Patient does not meet criteria for psychiatric inpatient admission. Discussed crisis plan, support from social network, calling 911, coming to the Emergency Department, and calling Suicide Hotline.   Revonda Humphrey, NP 02/12/21  3:33 PM

## 2021-02-12 NOTE — ED Notes (Signed)
Pt being accepted at [facility]. Waiting for number for report.
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
## 2021-02-12 NOTE — ED Notes (Signed)
Breakfast Orders Placed °

## 2021-02-12 NOTE — Progress Notes (Signed)
Patient is alert and oriented X4, denies SI, HI and AVH to this RN. Patient states she is just "Stressed over life." Patient has flat affect, logical, and coherent. Patient has older healed scars on left arm, patient states she cut herself years ago. Patient oriented to the unit, signed consent forms. Nursing staff will continue to monitor.

## 2021-02-12 NOTE — ED Notes (Signed)
Belongings inventoried and placed in locker#12.

## 2021-02-12 NOTE — ED Notes (Signed)
Patient is alert and oriented X 4, denies SI, HI and AVH. Pain 0/10.Patient received all belongings from [facility] locker. Patient left with safe transport to be transported home.

## 2021-02-12 NOTE — BH Assessment (Signed)
5:00 am- Contacted pt's RN to setup TTS assessment. RN Munnett said she was not available to setup the assessment now due to triaging other patients. RN stated she would contact TTS through [place] when cart is in place.  Dannilynn Gallina T. Mare Ferrari, [place], [facility], [facility] Triage [place] .

## 2021-02-13 ENCOUNTER — Telehealth: Payer: Self-pay

## 2021-02-13 NOTE — Telephone Encounter (Signed)
Transition Care Management Unsuccessful Follow-up Telephone Call  Date of discharge and from where:  02/12/2021-[place] [facility]   Attempts:  1st Attempt  Reason for unsuccessful TCM follow-up call:  Left voice message

## 2021-02-14 NOTE — Telephone Encounter (Signed)
Transition Care Management Follow-up Telephone Call Date of discharge and from where: 02/12/2021-[place]  How have you been since you were released from the hospital? Patient stated she is doing fine.  Any questions or concerns? No  Items Reviewed: Did the pt receive and understand the discharge instructions provided? Yes  Medications obtained and verified?  No medication given at discharge Other? No  Any new allergies since your discharge? No  Dietary orders reviewed? N/A Do you have support at home? Yes   Home Care and Equipment/Supplies: Were home health services ordered? not applicable If so, what is the name of the agency? N/A  Has the agency set up a time to come to the patient's home? not applicable Were any new equipment or medical supplies ordered?  No What is the name of the medical supply agency? NB/A Were you able to get the supplies/equipment? not applicable Do you have any questions related to the use of the equipment or supplies? No  Functional Questionnaire: (I = Independent and D = Dependent) ADLs: I  Bathing/Dressing- I  Meal Prep- I  Eating- I  Maintaining continence- I  Transferring/Ambulation- I  Managing Meds- I  Follow up appointments reviewed:  PCP Hospital f/u appt confirmed? No  Specialist Hospital f/u appt confirmed? Yes  Scheduled to see Hurman Horn on 03/06/2021 @ 9:00 am. Are transportation arrangements needed? No  If their condition worsens, is the pt aware to call PCP or go to the Emergency Dept.? Yes Was the patient provided with contact information for the PCP's office or ED? Yes Was to pt encouraged to call back with questions or concerns? Yes

## 2021-03-06 ENCOUNTER — Other Ambulatory Visit: Payer: Self-pay

## 2021-03-06 ENCOUNTER — Ambulatory Visit (INDEPENDENT_AMBULATORY_CARE_PROVIDER_SITE_OTHER): Payer: 59 | Admitting: Licensed Clinical Social Worker

## 2021-03-06 DIAGNOSIS — F3162 Bipolar disorder, current episode mixed, moderate: Secondary | ICD-10-CM
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
## 2021-03-15 ENCOUNTER — Other Ambulatory Visit: Payer: Self-pay

## 2021-03-15 ENCOUNTER — Encounter (HOSPITAL_COMMUNITY): Payer: Self-pay | Admitting: Psychiatry

## 2021-03-15 ENCOUNTER — Telehealth (INDEPENDENT_AMBULATORY_CARE_PROVIDER_SITE_OTHER): Payer: 59 | Admitting: Psychiatry

## 2021-03-15 DIAGNOSIS — F411 Generalized anxiety disorder: Secondary | ICD-10-CM | POA: Diagnosis not present

## 2021-03-15 DIAGNOSIS — F3181 Bipolar II disorder: Secondary | ICD-10-CM | POA: Diagnosis not present

## 2021-03-15 MED ORDER — ARIPIPRAZOLE 20 MG PO TABS: 20.0000 mg | ORAL_TABLET | Freq: Every day | ORAL | 3 refills | Status: DC

## 2021-03-15 MED ORDER — FLUOXETINE HCL 20 MG PO CAPS: 20.0000 mg | ORAL_CAPSULE | Freq: Every day | ORAL | 3 refills | Status: DC

## 2021-03-15 NOTE — Progress Notes (Signed)
[place] MD/PA/NP OP Progress Note  Virtual Visit via Video Note  I connected with Michaela Williams on 03/15/21 at 11:30 AM EDT by a video enabled telemedicine application and verified that I am speaking with the correct person using two identifiers.  Location: Patient: Home Provider: Clinic   I discussed the limitations of evaluation and management by telemedicine and the availability of in person appointments. The patient expressed understanding and agreed to proceed.  I provided 30 minutes of non-face-to-face time during this encounter.   [place] secrity ecti   03/15/2021 11:40 AM VIVAN ALIOTTA  MRN:  [id number]  Chief Complaint: "My meds help me adjust to being a mother and being in school"  HPI: 25 year old female seen today for follow up psychiatric evaluation.  She has a psychiatric history of bipolar 1 disorder, bipolar 2 disorder, adjustment disorder, anxiety, and depression.  She is currently managed on Abilify 20, Prozac 20 mg, and trazodone 50-100 mg daily.  She notes that she discontinued trazodone and report that her other medications are effective in managing her psychiatric conditions.   Today she was well-groomed, pleasant, cooperative, engaged in conversation, and maintained eye contact.  She informed provider that since her last visit  she was brought to [facility] on 8/9. Per note patient fiance was concerned that patient would harm herself. Patient notes that she felt overwhelmed with raising her children and being in school. 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 She has a 26 year old and a 26 year old and reports that life can be hectic at times.  Today she notes that she feels mentally stable and report that she has minimal anxiety and depression. Today provider conducted a GAD-7 and patient scored a 12, at her last visit she scored a 6.  Provider also conducted a PHQ-9 and patient scored a 4, at her last visit she scored a 4.  Today she endorses adequate sleep (6 hours)  and appetite.  She denies SI/HI/VAH, paranoia, or mania.  Patient does not want to restart Trazodone.  She will continue all other medication as prescribed.  She will follow-up with outpatient counseling for therapy.  No other concerns noted at this time.    Visit Diagnosis:  No diagnosis found.   Past Psychiatric History: bipolar 1 disorder, bipolar 2 disorder, adjustment disorder, anxiety, and depression.    Past Medical History:  Past Medical History:  Diagnosis Date   Anemia    Anxiety    Asthma    in childhood    breast ca dx'd 05/2017   left   Breast cancer ([place]) 05/2017   dx at age 89   Depression    no meds in 3 yrs   Family history of breast cancer    Headache    migraines   Infection    UTI   Seizures ([place]) 2014   "seizure like activity", reaction to medicaiton, none since   UGI bleed 06/02/2017   Unwanted fertility 08/22/2020   BTL papers signed 08/20/20 Bilateral salpingectomy 09/2020    Past Surgical History:  Procedure Laterality Date   ESOPHAGOGASTRODUODENOSCOPY (EGD) WITH PROPOFOL N/A 06/03/2017   Procedure: ESOPHAGOGASTRODUODENOSCOPY (EGD) WITH PROPOFOL;  Surgeon: Ladene Artist, MD;  Location: [facility];  Service: Endoscopy;  Laterality: N/A;   IUD REMOVAL N/A 09/26/2020   Procedure: INTRAUTERINE DEVICE (IUD) REMOVAL;  Surgeon: Chancy Milroy, MD;  Location: [facility];  Service: Gynecology;  Laterality: N/A;   LAPAROSCOPIC BILATERAL SALPINGECTOMY Bilateral 09/26/2020  Procedure: LAPAROSCOPIC BILATERAL SALPINGECTOMY;  Surgeon: Chancy Milroy, MD;  Location: [facility];  Service: Gynecology;  Laterality: Bilateral;    Family Psychiatric History: Patient was adopted but noted that she was told that her biological mother may have had bipolar disorder or schizophrenia  Family History:  Family History  Adopted: Yes  Problem Relation Age of Onset   Mental illness Mother    Breast cancer Mother    Hypertension Maternal  Grandmother    Diabetes Maternal Grandmother    Breast cancer Maternal Grandmother        d. in her 87s, "dealing with breast cancer all her life"   Breast cancer Maternal Aunt    Cervical cancer Maternal Aunt    Breast cancer Sister    Breast cancer Maternal Aunt    Breast cancer Maternal Aunt    Breast cancer Maternal Aunt    Breast cancer Maternal Aunt    Breast cancer Half-Sister        dx in her late 26s   Breast cancer Half-Sister        dx in her late 63s   Cervical cancer Half-Sister    Breast cancer Half-Sister        dx in her late 22s   Leukemia Nephew 4       currently in his teens    Social History:  Social History   Socioeconomic History   Marital status: Single    Spouse name: Not on file   Number of children: 1   Years of education: Not on file   Highest education level: Not on file  Occupational History   Occupation: unemployed  Tobacco Use   Smoking status: Former    Types: Cigarettes    Quit date: 12/19/2014    Years since quitting: 6.2   Smokeless tobacco: Never   Tobacco comments:    age 60  Vaping Use   Vaping Use: Former  Substance and Sexual Activity   Alcohol use: Not Currently    Comment: occasionally   Drug use: No   Sexual activity: Not Currently    Birth control/protection: None  Other Topics Concern   Not on file  Social History Narrative   Not on file   Social Determinants of Health   Financial Resource Strain: Not on file  Food Insecurity: Not on file  Transportation Needs: Not on file  Physical Activity: Not on file  Stress: Not on file  Social Connections: Not on file    Allergies:  Allergies  Allergen Reactions   Quetiapine Fumarate     Other reaction(s): Other Black outs/oversedation   Flexeril [Cyclobenzaprine]     Patient had a near syncopal event following Flexeril PO in [facility]U 11/21/2019   Lithium Other (See Comments)    SEIZURES, Childhood reaction   Lexapro [Escitalopram] Other (See Comments)    Didn't  like how it made her feel    Metabolic Disorder Labs: Lab Results  Component Value Date   HGBA1C 5.0 07/03/2017   MPG 96.8 07/03/2017   MPG 96.8 07/01/2017   Lab Results  Component Value Date   PROLACTIN 184.0 (H) 07/01/2017   Lab Results  Component Value Date   CHOL 196 07/03/2017   TRIG 45 07/03/2017   HDL 63 07/03/2017   CHOLHDL 3.1 07/03/2017   VLDL 9 07/03/2017   LDLCALC 124 (H) 07/03/2017   Lab Results  Component Value Date   TSH 1.650 12/07/2018   TSH 1.350 01/06/2018  Therapeutic Level Labs: No results found for: LITHIUM No results found for: VALPROATE No components found for:  CBMZ  Current Medications: Current Outpatient Medications  Medication Sig Dispense Refill   ARIPiprazole (ABILIFY) 20 MG tablet Take 1 tablet (20 mg total) by mouth daily. (Patient not taking: Reported on 02/12/2021) 30 tablet 2   FLUoxetine (PROZAC) 20 MG capsule Take 1 capsule (20 mg total) by mouth daily. (Patient not taking: Reported on 02/12/2021) 30 capsule 2   No current facility-administered medications for this visit.     Musculoskeletal: Strength & Muscle Tone:  Unable to assess due to telehealth visit [place]:  Unable to assess due to telehealth visit Patient leans: N/A  Psychiatric Specialty Exam: Review of Systems  currently breastfeeding.There is no height or weight on file to calculate BMI.  General Appearance: Well Groomed  Eye Contact:  Good  Speech:  Clear and Coherent and Normal Rate  Volume:  Normal  Mood:  Euthymic  Affect:  Congruent and Non-Congruent  Thought Process:  Coherent, Goal Directed and Linear  Orientation:  Full (Time, Place, and Person)  Thought Content: WDL and Logical   Suicidal Thoughts:  No  Homicidal Thoughts:  No  Memory:  Immediate;   Good Recent;   Good Remote;   Good  Judgement:  Good  Insight:  Good  Psychomotor Activity:  Normal  Concentration:  Concentration: Good and Attention Span: Good  Recall:  Good  Fund of  Knowledge: Good  Language: Good  Akathisia:  No  Handed:  Right  AIMS (if indicated):Not done  Assets:  Communication Skills Desire for Improvement Financial Resources/Insurance Housing Intimacy Social Support  ADL's:  Intact  Cognition: WNL  Sleep:  Good   Screenings: AIMS    Flowsheet Row Admission (Discharged) from OP Visit from 07/01/2017 in [place] [facility]  AIMS Total Score 0      AUDIT    Flowsheet Row Admission (Discharged) from OP Visit from 07/01/2017 in [place] [facility]  Alcohol Use Disorder Identification Test Final Score (AUDIT) 1      GAD-7    Flowsheet Row Video Visit from 11/12/2020 in [facility] Video Visit from 08/15/2020 in [facility] Video Visit from 05/23/2020 in [place] from 12/28/2019 in [facility] [place] [facility] Office Visit from 05/14/2017 in [facility] at [facility]  Total GAD-7 Score '6 14 21 21 17      '$ PHQ2-9    Flowsheet Row ED from 02/12/2021 in [place] Video Visit from 11/12/2020 in [facility] Video Visit from 08/15/2020 in [facility] Video Visit from 05/23/2020 in [place] from 12/28/2019 in [address] for [place] [facility]  PHQ-2 Total Score 2 0 '2 5 5  '$ PHQ-9 Total Score '9 4 13 22 25      '$ Flowsheet Row ED from 02/12/2021 in [facility] Most recent reading at 02/12/2021  1:10 PM ED from 02/12/2021 in [place] Most recent reading at 02/12/2021 10:17 AM Video Visit from 11/12/2020 in [facility] Most recent reading at 11/12/2020  2:39 PM  C-SSRS RISK CATEGORY  Error: Q3, 4, or 5 should not be populated when Q2 is No Error: Q3, 4, or 5  should not be populated when Q2 is No No Risk        Assessment and Plan: Patient notes that she is doing well on her current medication regimen.  Patient does not want to restart Trazodone.  She will continue all other medication as prescribed.  1. Bipolar 2 disorder, major depressive episode (HCC)  Continue- FLUoxetine (PROZAC) 20 MG capsule; Take 1 capsule (20 mg total) by mouth daily.  Dispense: 30 capsule; Refill: 3 Continue- ARIPiprazole (ABILIFY) 20 MG tablet; Take 1 tablet (20 mg total) by mouth daily.  Dispense: 30 tablet; Refill: 3  2. Generalized anxiety disorder  Continue- FLUoxetine (PROZAC) 20 MG capsule; Take 1 capsule (20 mg total) by mouth daily.  Dispense: 30 capsule; Refill: 3  Follow-up in 3 months Follow-up with therapy Salley Slaughter, NP 03/15/2021, 11:40 AM

## 2021-03-19 ENCOUNTER — Ambulatory Visit (INDEPENDENT_AMBULATORY_CARE_PROVIDER_SITE_OTHER): Payer: 59 | Admitting: Licensed Clinical Social Worker

## 2021-03-19 DIAGNOSIS — F3162 Bipolar disorder, current episode mixed, moderate: Secondary | ICD-10-CM | POA: Diagnosis not present

## 2021-03-21 NOTE — Progress Notes (Signed)
THERAPIST PROGRESS NOTE   Virtual Visit via Video Note  I connected with Michaela Williams on 03/19/21 at  1:00 PM EDT by a video enabled telemedicine application and verified that I am speaking with the correct person using two identifiers.  Location: Patient: in car in parking lot Provider: [facility]   I discussed the limitations of evaluation and management by telemedicine and the availability of in person appointments. The patient expressed understanding and agreed to proceed. I discussed the assessment and treatment plan with the patient. The patient was provided an opportunity to ask questions and all were answered. The patient agreed with the plan and demonstrated an understanding of the instructions.   The patient was advised to call back or seek an in-person evaluation if the symptoms worsen or if the condition fails to improve as anticipated.  I provided 40 minutes of non-face-to-face time during this encounter.  Participation Level: Active  Behavioral Response: CasualAlertAnxious and Depressed  Type of Therapy: Individual Therapy  Treatment Goals addressed: Communication: dep/anx/coping  Interventions: CBT, Solution Focused, Supportive, and Reframing  Summary: Michaela Williams is a 25 y.o. female who presents with hx of Bipolar Dis. This date pt signs on for session late and is driving. LCSW declines to do session with pt driving. Pt states she was trying to get to her car from school and drive to where she can get wi fi d/t poor connection in school parking lot. LCSW agrees to stay online for pt to park, which she does in a few minutes at a [employer] to use their wi fi. Pt apologetic. Pt reports she is "Not worse or better" since last session. Pt states she does feel very good about school and has gotten many complements from instructors r/t her work. Pt reports being more stressed adding school in but states she feels she can do what is needed for 2 yrs given the payoff of a better  life for self and children. LCSW assessed for status of relationship with [name]. Pt states they are still having heated arguments. She states she again attempted to talk with him about couples counseling, which he refuses. Pt states she feels like she is on "egg shells" when around him and admits the relationship is not healthy. Reports she is declining intimacy with him. States he always has to "be right". Pt reports her dtr was playing with her phone and accidentally called pt's mother. Mother overheard [name] "yelling and swearing at me" and now mother is offfering less pressure for pt to stay with [name]. Pt previously stated she wanted to stay for the children. Reviewed impacts of the children being in a toxic household. Pt reports they usually do not argue in front of the children and agrees to make an effort to avoid their exposure to heated arguments. She states [name] is not demeaning to the children yet does not do well with her 29 yo autistic son. Pt reports she is feeling little hope of being able to salvage the relationship yet still has no place she can afford on her own. She continues to work full time. Parents continue to help with child care. Pt staying focused on long range goals to assist with coping and building self confidence. LCSW reviewed poc including scheduling prior to close of session. Pt states appreciation for care.   Suicidal/Homicidal: Nowithout intent/plan  Therapist Response: Pt receptive to care.  Plan: Return again in ~5 weeks.  Diagnosis: Axis I: Bipolar, mixed  Hermine Messick,  LCSW 03/21/2021

## 2021-04-13 ENCOUNTER — Encounter: Payer: Self-pay | Admitting: Radiology

## 2021-05-03 ENCOUNTER — Ambulatory Visit (INDEPENDENT_AMBULATORY_CARE_PROVIDER_SITE_OTHER): Payer: 59 | Admitting: Licensed Clinical Social Worker

## 2021-05-03 DIAGNOSIS — F3161 Bipolar disorder, current episode mixed, mild: Secondary | ICD-10-CM

## 2021-05-09 NOTE — Progress Notes (Signed)
   THERAPIST PROGRESS NOTE   Virtual Visit via Video Note  I connected with Michaela Williams on 05/03/21 at 11:00 AM EDT by a video enabled telemedicine application and verified that I am speaking with the correct person using two identifiers.  Location: Patient: Home Provider: Home   I discussed the limitations of evaluation and management by telemedicine and the availability of in person appointments. The patient expressed understanding and agreed to proceed. I discussed the assessment and treatment plan with the patient. The patient was provided an opportunity to ask questions and all were answered. The patient agreed with the plan and demonstrated an understanding of the instructions.   The patient was advised to call back or seek an in-person evaluation if the symptoms worsen or if the condition fails to improve as anticipated.  I provided 18 minutes of non-face-to-face time during this encounter.  Participation Level: Active  Behavioral Response: CasualAlertAnxious  Type of Therapy: Individual Therapy  Treatment Goals addressed: Communication: anx/dep/coping  Interventions: Supportive  Summary: Michaela Williams is a 25 y.o. female who presents with hx of bipolar/anx/dep. Today pt signs on for video session from home. Pt reports she is home as her dtr is ill and she and her boyfriend are getting ready to take her to in person Dr appt. Child reportedly has upper resp symptoms. Pt states she is also home as she quit her job. Pt advises she wanted to be able to focus on school. She states the accelerated program is very full time. She further reports she continues to do very well and is on track to be on the Dean's list. Pt states she intermittently does [employer] to help with finances. LCSW commended pt for her follow through with school and provided encouragement. Pt reports she and [name] are arguing less and getting along better. Pt self reports she went off her meds yet again. She  states she was off for several wks before [name] commented on her irritability and needing her meds. Pt is back on meds at present and acknowledges they are helpful. Assessment of thought process r/t going off meds explored. Pt states she just starts "feeling like I can do without it". This was addressed in detail with CBT, self talk, evidence. Pt states intent to stay on meds going forward. Pt needs to leave for Dr appt. LCSW reviewed poc including scheduling prior to close of session. Pt states appreciation for care.    Suicidal/Homicidal: Nowithout intent/plan  Therapist Response: Pt receptive to care.  Plan: Return again for next avail appt.  Diagnosis: Axis I: Bipolar, mixed   Hermine Messick, LCSW 05/09/2021

## 2021-06-14 ENCOUNTER — Telehealth (INDEPENDENT_AMBULATORY_CARE_PROVIDER_SITE_OTHER): Payer: 59 | Admitting: Psychiatry

## 2021-06-14 ENCOUNTER — Encounter (HOSPITAL_COMMUNITY): Payer: Self-pay | Admitting: Psychiatry

## 2021-06-14 DIAGNOSIS — F900 Attention-deficit hyperactivity disorder, predominantly inattentive type: Secondary | ICD-10-CM | POA: Diagnosis not present

## 2021-06-14 DIAGNOSIS — F411 Generalized anxiety disorder: Secondary | ICD-10-CM | POA: Diagnosis not present

## 2021-06-14 DIAGNOSIS — F3181 Bipolar II disorder: Secondary | ICD-10-CM | POA: Diagnosis not present

## 2021-06-14 MED ORDER — ATOMOXETINE HCL 40 MG PO CAPS: 40.0000 mg | ORAL_CAPSULE | Freq: Every day | ORAL | 2 refills | Status: DC

## 2021-06-14 MED ORDER — FLUOXETINE HCL 20 MG PO CAPS: 20.0000 mg | ORAL_CAPSULE | Freq: Every day | ORAL | 3 refills | Status: DC

## 2021-06-14 MED ORDER — ARIPIPRAZOLE 20 MG PO TABS: 20.0000 mg | ORAL_TABLET | Freq: Every day | ORAL | 3 refills | Status: DC

## 2021-06-14 NOTE — Progress Notes (Signed)
BH MD/PA/NP OP Progress Note  Virtual Visit via Video Note  I connected with Michaela Williams on 06/14/21 at 11:00 AM EST by a video enabled telemedicine application and verified that I am speaking with the correct person using two identifiers.  Location: Patient: Home Provider: Clinic   I discussed the limitations of evaluation and management by telemedicine and the availability of in person appointments. The patient expressed understanding and agreed to proceed.  I provided 30 minutes of non-face-to-face time during this encounter.   [place] secrity ecti   06/14/2021 11:17 AM Michaela Williams  MRN:  [id number]  Chief Complaint: "Im am having a hard time focusing"  HPI: 25 year old female seen today for follow up psychiatric evaluation.  She has a psychiatric history of bipolar 1 disorder, bipolar 2 disorder, adjustment disorder, anxiety, and depression.  She is currently managed on  and Abilify 20, Prozac 20 mg.  She notes that her medications are somewhat effective in managing her psychiatric conditions   Today she was well-groomed, pleasant, cooperative, engaged in conversation, and maintained eye contact.  She informed provider that since her last visit  she has been having focusing. She endoeses symptoms of ADHD such as distractibility, poor listening skills, disorganization, forgetfulness, and avoidance of mentally taxing task. Patient notes that she is at school at [employer] andd wants to be able to focus in class. In the past she notes that she has tried Intuniv without success and request Adderall. Provider recommended a no stimulant which patient was agreeable to.   Patient reports that she has minimal anxiety and depression. Today provider conducted a GAD-7 and patient scored an 8, at her last visit she scored a 12.  Provider also conducted a PHQ-9 and patient scored an 8, at her last visit she scored a 4.  Today she endorses adequate sleep and appetite.  She denies  SI/HI/VAH, paranoia, or mania.  Today she is agreeable to start Strattera 40 mg to help manage symptoms of ADHD. Provider notes that Adderall could be tried in the future if Strattera is ineffective. Potential side effects of medication and risks vs benefits of treatment vs non-treatment were explained and discussed. All questions were answered. She will continue all other medication as prescribed.  She will follow-up with outpatient counseling for therapy.  No other concerns noted at this time.    Visit Diagnosis:    ICD-10-CM   1. Attention deficit hyperactivity disorder (ADHD), predominantly inattentive type  F90.0 atomoxetine (STRATTERA) 40 MG capsule    2. Bipolar 2 disorder, major depressive episode (HCC)  F31.81 ARIPiprazole (ABILIFY) 20 MG tablet    FLUoxetine (PROZAC) 20 MG capsule    3. Generalized anxiety disorder  F41.1 FLUoxetine (PROZAC) 20 MG capsule      Past Psychiatric History: bipolar 1 disorder, bipolar 2 disorder, adjustment disorder, anxiety, and depression.    Past Medical History:  Past Medical History:  Diagnosis Date   Anemia    Anxiety    Asthma    in childhood    breast ca dx'd 05/2017   left   Breast cancer ([place]) 05/2017   dx at age 27   Depression    no meds in 3 yrs   Family history of breast cancer    Headache    migraines   Infection    UTI   Seizures ([place]) 2014   "seizure like activity", reaction to medicaiton, none since   UGI bleed 06/02/2017   Unwanted fertility 08/22/2020  BTL papers signed 08/20/20 Bilateral salpingectomy 09/2020    Past Surgical History:  Procedure Laterality Date   ESOPHAGOGASTRODUODENOSCOPY (EGD) WITH PROPOFOL N/A 06/03/2017   Procedure: ESOPHAGOGASTRODUODENOSCOPY (EGD) WITH PROPOFOL;  Surgeon: Ladene Artist, MD;  [facility];  Service: Endoscopy;  Laterality: N/A;   IUD REMOVAL N/A 09/26/2020   Procedure: INTRAUTERINE DEVICE (IUD) REMOVAL;  Surgeon: Chancy Milroy, MD;  Location: [place];  Service: Gynecology;  Laterality: N/A;   LAPAROSCOPIC BILATERAL SALPINGECTOMY Bilateral 09/26/2020   Procedure: LAPAROSCOPIC BILATERAL SALPINGECTOMY;  Surgeon: Chancy Milroy, MD;  Location: [place];  Service: Gynecology;  Laterality: Bilateral;    Family Psychiatric History: Patient was adopted but noted that she was told that her biological mother may have had bipolar disorder or schizophrenia  Family History:  Family History  Adopted: Yes  Problem Relation Age of Onset   Mental illness Mother    Breast cancer Mother    Hypertension Maternal Grandmother    Diabetes Maternal Grandmother    Breast cancer Maternal Grandmother        d. in her 76s, "dealing with breast cancer all her life"   Breast cancer Maternal Aunt    Cervical cancer Maternal Aunt    Breast cancer Sister    Breast cancer Maternal Aunt    Breast cancer Maternal Aunt    Breast cancer Maternal Aunt    Breast cancer Maternal Aunt    Breast cancer Half-Sister        dx in her late 46s   Breast cancer Half-Sister        dx in her late 68s   Cervical cancer Half-Sister    Breast cancer Half-Sister        dx in her late 23s   Leukemia Nephew 4       currently in his teens    Social History:  Social History   Socioeconomic History   Marital status: Single    Spouse name: Not on file   Number of children: 1   Years of education: Not on file   Highest education level: Not on file  Occupational History   Occupation: unemployed  Tobacco Use   Smoking status: Former    Types: Cigarettes    Quit date: 12/19/2014    Years since quitting: 6.4   Smokeless tobacco: Never   Tobacco comments:    age 47  Vaping Use   Vaping Use: Former  Substance and Sexual Activity   Alcohol use: Not Currently    Comment: occasionally   Drug use: No   Sexual activity: Not Currently    Birth control/protection: None  Other Topics Concern   Not on file  Social History Narrative   Not on file    Social Determinants of Health   Financial Resource Strain: Not on file  Food Insecurity: Not on file  Transportation Needs: Not on file  Physical Activity: Not on file  Stress: Not on file  Social Connections: Not on file    Allergies:  Allergies  Allergen Reactions   Quetiapine Fumarate     Other reaction(s): Other Black outs/oversedation   Flexeril [Cyclobenzaprine]     Patient had a near syncopal event following Flexeril PO in MAU 11/21/2019   Lithium Other (See Comments)    SEIZURES, Childhood reaction   Lexapro [Escitalopram] Other (See Comments)    Didn't like how it made her feel    Metabolic Disorder Labs: Lab Results  Component Value Date  HGBA1C 5.0 07/03/2017   MPG 96.8 07/03/2017   MPG 96.8 07/01/2017   Lab Results  Component Value Date   PROLACTIN 184.0 (H) 07/01/2017   Lab Results  Component Value Date   CHOL 196 07/03/2017   TRIG 45 07/03/2017   HDL 63 07/03/2017   CHOLHDL 3.1 07/03/2017   VLDL 9 07/03/2017   LDLCALC 124 (H) 07/03/2017   Lab Results  Component Value Date   TSH 1.650 12/07/2018   TSH 1.350 01/06/2018    Therapeutic Level Labs: No results found for: LITHIUM No results found for: VALPROATE No components found for:  CBMZ  Current Medications: Current Outpatient Medications  Medication Sig Dispense Refill   atomoxetine (STRATTERA) 40 MG capsule Take 1 capsule (40 mg total) by mouth daily. 30 capsule 2   ARIPiprazole (ABILIFY) 20 MG tablet Take 1 tablet (20 mg total) by mouth daily. 30 tablet 3   FLUoxetine (PROZAC) 20 MG capsule Take 1 capsule (20 mg total) by mouth daily. 30 capsule 3   No current facility-administered medications for this visit.     Musculoskeletal: Strength & Muscle Tone:  Unable to assess due to telehealth visit Lane:  Unable to assess due to telehealth visit Patient leans: N/A  Psychiatric Specialty Exam: Review of Systems  currently breastfeeding.There is no height or weight on  file to calculate BMI.  General Appearance: Well Groomed  Eye Contact:  Good  Speech:  Clear and Coherent and Normal Rate  Volume:  Normal  Mood:  Euthymic  Affect:  Congruent and Non-Congruent  Thought Process:  Coherent, Goal Directed and Linear  Orientation:  Full (Time, Place, and Person)  Thought Content: WDL and Logical   Suicidal Thoughts:  No  Homicidal Thoughts:  No  Memory:  Immediate;   Good Recent;   Good Remote;   Good  Judgement:  Good  Insight:  Good  Psychomotor Activity:  Normal  Concentration:  Concentration: Good and Attention Span: Good  Recall:  Good  Fund of Knowledge: Good  Language: Good  Akathisia:  No  Handed:  Right  AIMS (if indicated):Not done  Assets:  Communication Skills Desire for Improvement Financial Resources/Insurance Housing Intimacy Social Support  ADL's:  Intact  Cognition: WNL  Sleep:  Good   Screenings: AIMS    Flowsheet Row Admission (Discharged) from OP Visit from 07/01/2017 in [facility]  AIMS Total Score 0      AUDIT    Flowsheet Row Admission (Discharged) from OP Visit from 07/01/2017 in [facility]  Alcohol Use Disorder Identification Test Final Score (AUDIT) 1      GAD-7    Flowsheet Row Video Visit from 06/14/2021 in [facility] Video Visit from 03/15/2021 in [facility] Video Visit from 11/12/2020 in [facility] Video Visit from 08/15/2020 in [facility] Video Visit from 05/23/2020 in [facility]  Total GAD-7 Score 8 12 6 14 21       PHQ2-9    Flowsheet Row Video Visit from 06/14/2021 in [facility] Video Visit from 03/15/2021 in [facility] ED from 02/12/2021 in [place] Video Visit from 11/12/2020 in [facility] Video Visit from 08/15/2020 in [facility]  PHQ-2 Total Score 0 0 2 0 2  PHQ-9 Total Score 8 4 9  [place] ED from 02/12/2021 in [facility] Most recent reading at 02/12/2021  1:10 PM ED from 02/12/2021 in [place] Most recent reading at 02/12/2021 10:17 AM Video Visit from 11/12/2020 in [facility] Most recent reading at 11/12/2020  2:39 PM  C-SSRS RISK CATEGORY Error: Q3, 4, or 5 should not be populated when Q2 is No Error: Q3, 4, or 5 should not be populated when Q2 is No No Risk        Assessment and Plan: Patient endorses symptoms of ADHD but notes her mood, anxiety, depression, and sleep are good. Today she is agreeable to start Strattera 40 mg to help manage symptoms of ADHD. Provider notes that Adderall could be tried in the future if Strattera is ineffective. She will continue all other medications as prescribed.  1. Bipolar 2 disorder, major depressive episode (HCC)  Continue- ARIPiprazole (ABILIFY) 20 MG tablet; Take 1 tablet (20 mg total) by mouth daily.  Dispense: 30 tablet; Refill: 3 Continue- FLUoxetine (PROZAC) 20 MG capsule; Take 1 capsule (20 mg total) by mouth daily.  Dispense: 30 capsule; Refill: 3  2. Generalized anxiety disorder  Continue- FLUoxetine (PROZAC) 20 MG capsule; Take 1 capsule (20 mg total) by mouth daily.  Dispense: 30 capsule; Refill: 3  3. Attention deficit hyperactivity disorder (ADHD), predominantly inattentive type  Start- atomoxetine (STRATTERA) 40 MG capsule; Take 1 capsule (40 mg total) by mouth daily.  Dispense: 30 capsule; Refill: 2   Follow-up in 3 months Follow-up with therapy Salley Slaughter, NP 06/14/2021, 11:17 AM

## 2021-06-24 IMAGING — DX DG ABDOMEN 2V
2 series · 2 of 2 positions shown · non-contrast
Comparison: CT scan and radiograph June 02, 2017.

CLINICAL DATA: Generalized abdominal pain.

EXAM:
ABDOMEN - 2 VIEW

[abdomen erect]
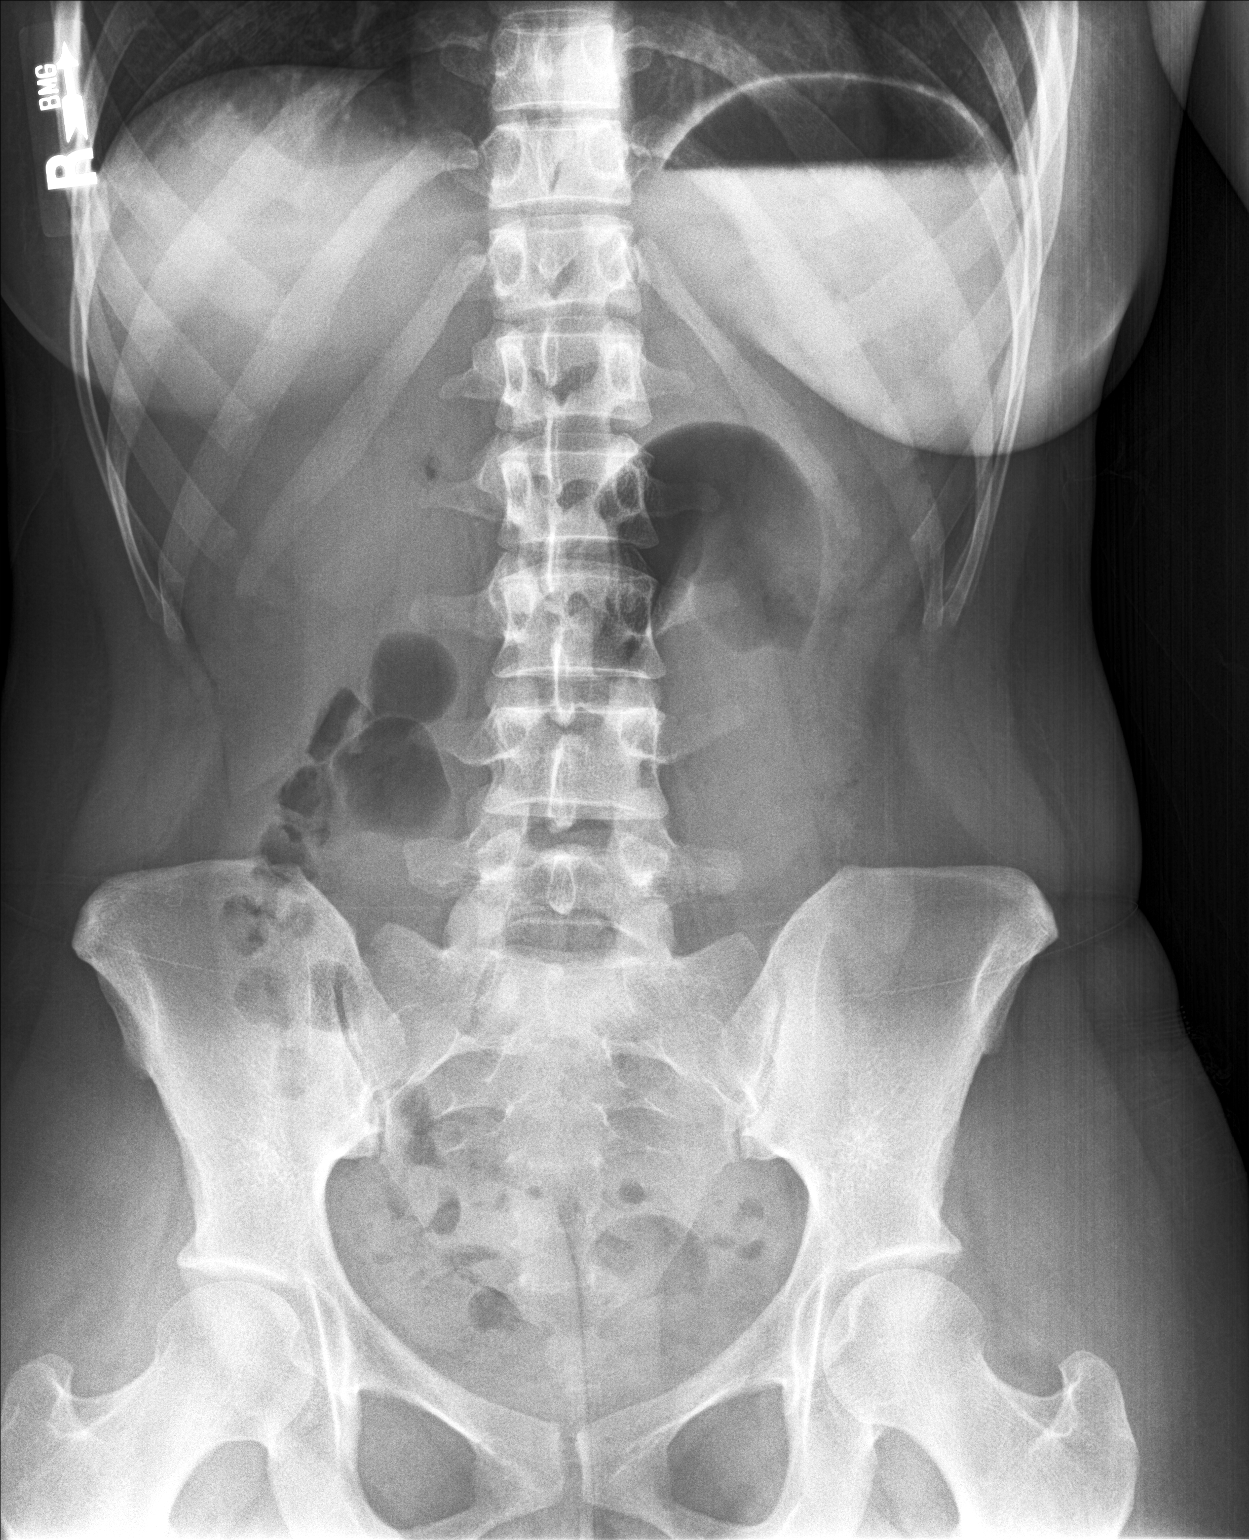

[abdomen supine]
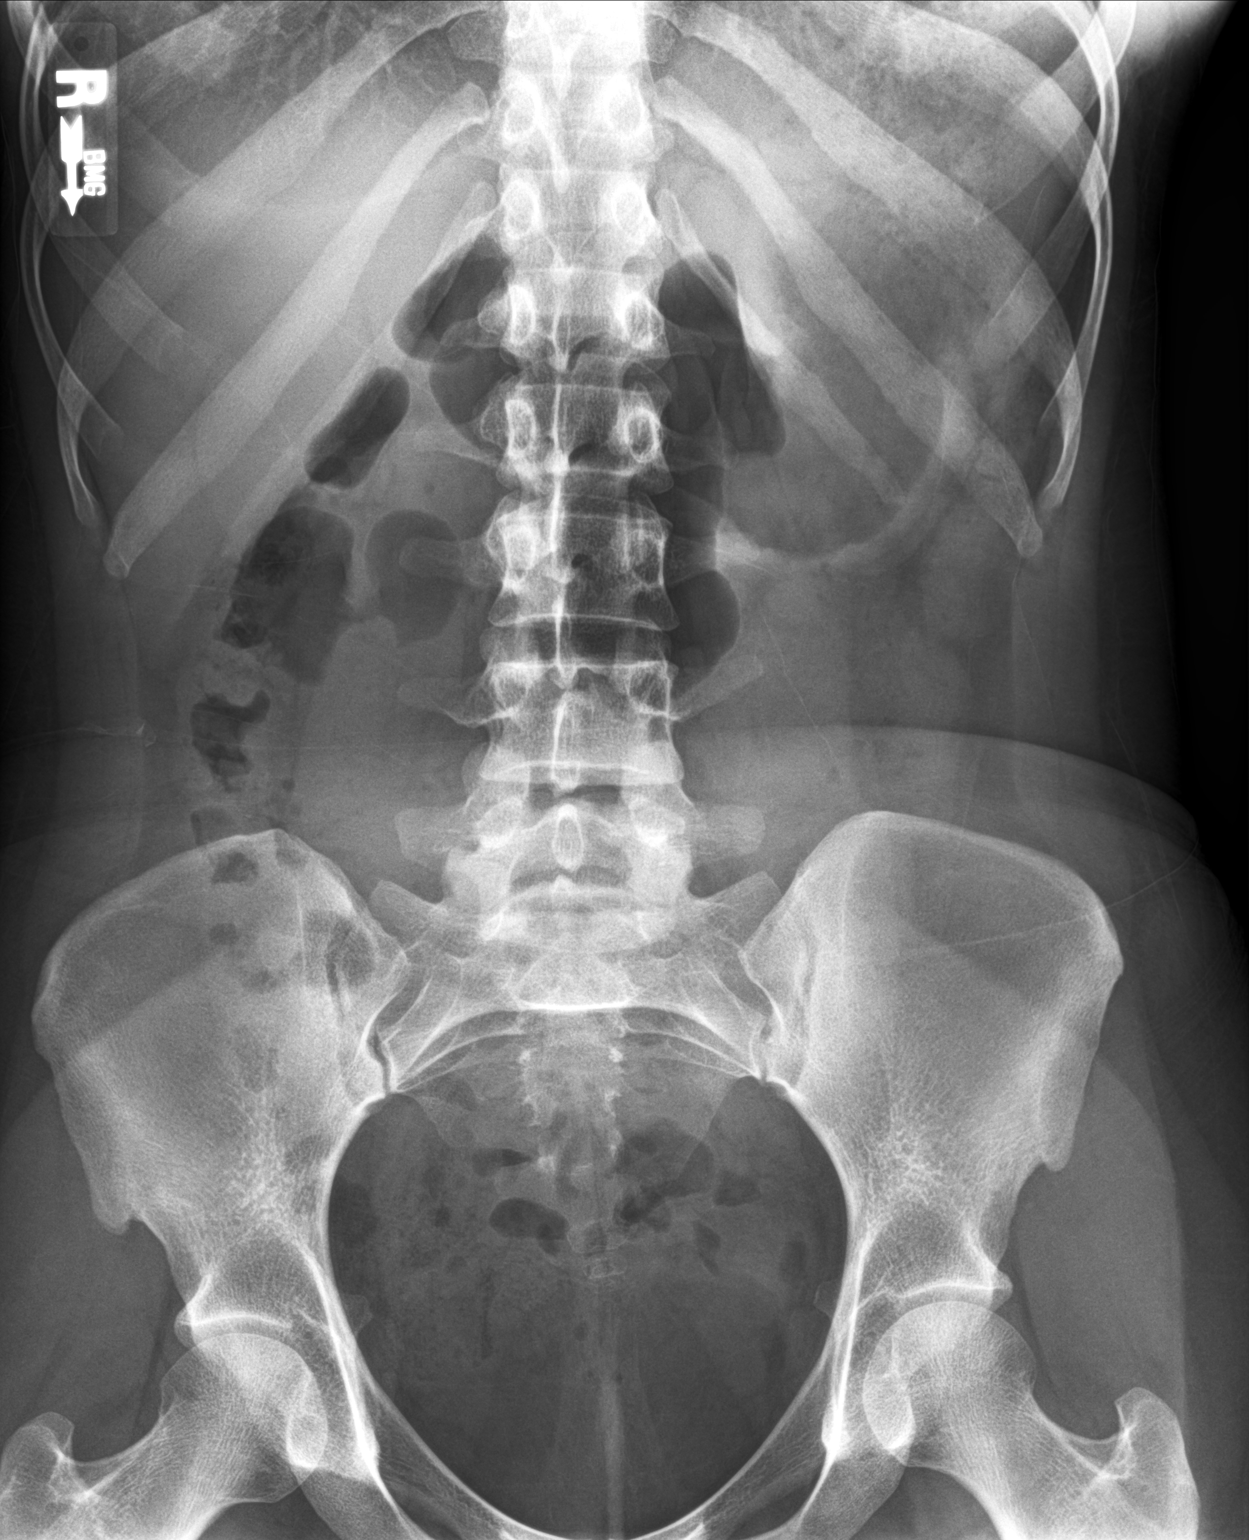

[2 of 2 positions shown; findings below may reference images not displayed]

FINDINGS: The bowel gas pattern is normal. There is no evidence of free air.
No radio-opaque calculi or other significant radiographic
abnormality is seen.
IMPRESSION: No evidence of bowel obstruction or ileus.

## 2021-08-30 ENCOUNTER — Encounter (HOSPITAL_COMMUNITY): Payer: Self-pay | Admitting: Psychiatry

## 2021-08-30 ENCOUNTER — Telehealth (INDEPENDENT_AMBULATORY_CARE_PROVIDER_SITE_OTHER): Payer: Medicaid Other | Admitting: Psychiatry

## 2021-08-30 DIAGNOSIS — F3181 Bipolar II disorder: Secondary | ICD-10-CM | POA: Diagnosis not present

## 2021-08-30 DIAGNOSIS — F900 Attention-deficit hyperactivity disorder, predominantly inattentive type: Secondary | ICD-10-CM

## 2021-08-30 DIAGNOSIS — F411 Generalized anxiety disorder: Secondary | ICD-10-CM | POA: Diagnosis not present

## 2021-08-30 MED ORDER — AMPHETAMINE-DEXTROAMPHETAMINE 10 MG PO TABS: 10.0000 mg | ORAL_TABLET | Freq: Every day | ORAL | 0 refills | Status: DC

## 2021-08-30 MED ORDER — FLUOXETINE HCL 20 MG PO CAPS: 20.0000 mg | ORAL_CAPSULE | Freq: Every day | ORAL | 3 refills | Status: DC

## 2021-08-30 MED ORDER — ARIPIPRAZOLE 20 MG PO TABS: 20.0000 mg | ORAL_TABLET | Freq: Every day | ORAL | 3 refills | Status: DC

## 2021-08-30 NOTE — Progress Notes (Signed)
[facility] MD/PA/NP OP Progress Note    Virtual Visit via Telephone Note  I connected with Michaela Williams on 08/30/21 at  8:00 AM EST by telephone and verified that I am speaking with the correct person using two identifiers.  Location: Patient: home Provider: Clinic   I discussed the limitations, risks, security and privacy concerns of performing an evaluation and management service by telephone and the availability of in person appointments. I also discussed with the patient that there may be a patient responsible charge related to this service. The patient expressed understanding and agreed to proceed.   I provided 30 minutes of non-face-to-face time during this encounter.    08/30/2021 8:04 AM Michaela Williams  MRN:  [id number]  Chief Complaint: "I'm doing good. I got engaged"  HPI: 26 year old female seen today for follow up psychiatric evaluation.  She has a psychiatric history of bipolar 1 disorder, bipolar 2 disorder, adjustment disorder, anxiety, and depression.  She is currently managed on  Strattera 40 mg, Abilify 20, and Prozac 20 mg.  She notes that her medications are somewhat effective in managing her psychiatric conditions   Today she was unable to logon virtually so her assessment was done over the phone.  During exam she was pleasant, cooperative, and engaged in conversation.  She informed provider that since her last visit  she continues to have problems focusing.  She notes that Christianne Borrow has been unsuccessful however notes that she continues to take it.  She endoeses symptoms of ADHD such as distractibility, poor listening skills, disorganization, forgetfulness, and avoidance of mentally taxing task. Patient notes that she is at school at [employer] and wants to be able to focus in class.  Patient has trialed Intuniv without success and now Strattera without success.  She asked that Adderall could be added to her regimen.  Provider was agreeable to this.     Patient informed Probation officer that her anxiety and depression are minimal.  She reports that she is happy because she got engaged over the winter holiday and is looking forward to getting married next October.  Today provider conducted GAD-7 and patient scored a 10, at her last visit she scored an 8.  Provider also conducted PHQ-9 and patient scored a 9, at her last visit she scored a 8.   Today she endorses adequate sleep and appetite.  She denies SI/HI/VAH, paranoia, or mania.  Today she is agreeable to discontinue Strattera 40 mg . She will start Adderall 10 mg daily to help manage symptoms of ADHD. Potential side effects of medication and risks vs benefits of treatment vs non-treatment were explained and discussed. All questions were answered. She will continue all other medication as prescribed.  She will follow-up with outpatient counseling for therapy.  No other concerns noted at this time.    Visit Diagnosis:    ICD-10-CM   1. Attention deficit hyperactivity disorder (ADHD), predominantly inattentive type  F90.0 amphetamine-dextroamphetamine (ADDERALL) 10 MG tablet    2. Bipolar 2 disorder, major depressive episode (HCC)  F31.81 ARIPiprazole (ABILIFY) 20 MG tablet    FLUoxetine (PROZAC) 20 MG capsule    3. Generalized anxiety disorder  F41.1 FLUoxetine (PROZAC) 20 MG capsule      Past Psychiatric History: bipolar 1 disorder, bipolar 2 disorder, adjustment disorder, anxiety, and depression.    Past Medical History:  Past Medical History:  Diagnosis Date   Anemia    Anxiety    Asthma    in childhood  breast ca dx'd 05/2017   left   Breast cancer ([place]) 05/2017   dx at age 73   Depression    no meds in 3 yrs   Family history of breast cancer    Headache    migraines   Infection    UTI   Seizures ([place]) 2014   "seizure like activity", reaction to medicaiton, none since   UGI bleed 06/02/2017   Unwanted fertility 08/22/2020   BTL papers signed 08/20/20 Bilateral salpingectomy  09/2020    Past Surgical History:  Procedure Laterality Date   ESOPHAGOGASTRODUODENOSCOPY (EGD) WITH PROPOFOL N/A 06/03/2017   Procedure: ESOPHAGOGASTRODUODENOSCOPY (EGD) WITH PROPOFOL;  Surgeon: Ladene Artist, MD;  [facility];  Service: Endoscopy;  Laterality: N/A;   IUD REMOVAL N/A 09/26/2020   Procedure: INTRAUTERINE DEVICE (IUD) REMOVAL;  Surgeon: Chancy Milroy, MD;  Location: [place];  Service: Gynecology;  Laterality: N/A;   LAPAROSCOPIC BILATERAL SALPINGECTOMY Bilateral 09/26/2020   Procedure: LAPAROSCOPIC BILATERAL SALPINGECTOMY;  Surgeon: Chancy Milroy, MD;  Location: [place];  Service: Gynecology;  Laterality: Bilateral;    Family Psychiatric History: Patient was adopted but noted that she was told that her biological mother may have had bipolar disorder or schizophrenia  Family History:  Family History  Adopted: Yes  Problem Relation Age of Onset   Mental illness Mother    Breast cancer Mother    Hypertension Maternal Grandmother    Diabetes Maternal Grandmother    Breast cancer Maternal Grandmother        d. in her 72s, "dealing with breast cancer all her life"   Breast cancer Maternal Aunt    Cervical cancer Maternal Aunt    Breast cancer Sister    Breast cancer Maternal Aunt    Breast cancer Maternal Aunt    Breast cancer Maternal Aunt    Breast cancer Maternal Aunt    Breast cancer Half-Sister        dx in her late 52s   Breast cancer Half-Sister        dx in her late 72s   Cervical cancer Half-Sister    Breast cancer Half-Sister        dx in her late 56s   Leukemia Nephew 4       currently in his teens    Social History:  Social History   Socioeconomic History   Marital status: Single    Spouse name: Not on file   Number of children: 1   Years of education: Not on file   Highest education level: Not on file  Occupational History   Occupation: unemployed  Tobacco Use   Smoking status: Former     Types: Cigarettes    Quit date: 12/19/2014    Years since quitting: 6.7   Smokeless tobacco: Never   Tobacco comments:    age 54  Vaping Use   Vaping Use: Former  Substance and Sexual Activity   Alcohol use: Not Currently    Comment: occasionally   Drug use: No   Sexual activity: Not Currently    Birth control/protection: None  Other Topics Concern   Not on file  Social History Narrative   Not on file   Social Determinants of Health   Financial Resource Strain: Not on file  Food Insecurity: Not on file  Transportation Needs: Not on file  Physical Activity: Not on file  Stress: Not on file  Social Connections: Not on file    Allergies:  Allergies  Allergen Reactions   Quetiapine Fumarate     Other reaction(s): Other Black outs/oversedation   Flexeril [Cyclobenzaprine]     Patient had a near syncopal event following Flexeril PO in MAU 11/21/2019   Lithium Other (See Comments)    SEIZURES, Childhood reaction   Lexapro [Escitalopram] Other (See Comments)    Didn't like how it made her feel    Metabolic Disorder Labs: Lab Results  Component Value Date   HGBA1C 5.0 07/03/2017   MPG 96.8 07/03/2017   MPG 96.8 07/01/2017   Lab Results  Component Value Date   PROLACTIN 184.0 (H) 07/01/2017   Lab Results  Component Value Date   CHOL 196 07/03/2017   TRIG 45 07/03/2017   HDL 63 07/03/2017   CHOLHDL 3.1 07/03/2017   VLDL 9 07/03/2017   LDLCALC 124 (H) 07/03/2017   Lab Results  Component Value Date   TSH 1.650 12/07/2018   TSH 1.350 01/06/2018    Therapeutic Level Labs: No results found for: LITHIUM No results found for: VALPROATE No components found for:  CBMZ  Current Medications: Current Outpatient Medications  Medication Sig Dispense Refill   amphetamine-dextroamphetamine (ADDERALL) 10 MG tablet Take 1 tablet (10 mg total) by mouth daily. 30 tablet 0   ARIPiprazole (ABILIFY) 20 MG tablet Take 1 tablet (20 mg total) by mouth daily. 30 tablet 3    FLUoxetine (PROZAC) 20 MG capsule Take 1 capsule (20 mg total) by mouth daily. 30 capsule 3   No current facility-administered medications for this visit.     Musculoskeletal: Strength & Muscle Tone:  Unable to assess due to telephone visit Gait & Station:  Unable to assess due to telephone visit Patient leans: N/A  Psychiatric Specialty Exam: Review of Systems  currently breastfeeding.There is no height or weight on file to calculate BMI.  General Appearance:  Unable to assess due to telephone visit  Eye Contact:   Unable to assess due to telephone visit  Speech:  Clear and Coherent and Normal Rate  Volume:  Normal  Mood:  Euthymic  Affect:  Congruent and Non-Congruent  Thought Process:  Coherent, Goal Directed and Linear  Orientation:  Full (Time, Place, and Person)  Thought Content: WDL and Logical   Suicidal Thoughts:  No  Homicidal Thoughts:  No  Memory:  Immediate;   Good Recent;   Good Remote;   Good  Judgement:  Good  Insight:  Good  Psychomotor Activity:  Normal  Concentration:  Concentration: Fair and Attention Span: Fair  Recall:  Good  Fund of Knowledge: Good  Language: Good  Akathisia:   Unable to assess due to telephone visit  Handed:  Right  AIMS (if indicated):Not done  Assets:  Communication Skills Desire for Improvement Financial Resources/Insurance Housing Intimacy Social Support  ADL's:  Intact  Cognition: WNL  Sleep:  Good   Screenings: AIMS    Flowsheet Row Admission (Discharged) from OP Visit from 07/01/2017 in [facility]  AIMS Total Score 0      AUDIT    Flowsheet Row Admission (Discharged) from OP Visit from 07/01/2017 in [facility]  Alcohol Use Disorder Identification Test Final Score (AUDIT) 1      GAD-7    Flowsheet Row Video [facility] from 08/30/2021 in [facility] Video Visit from 06/14/2021 in [facility] Video Visit from 03/15/2021 in [facility] Video Visit from 11/12/2020 in [facility] Video Visit from 08/15/2020 in [facility]  Total GAD-7 Score 10 8 12 6 14       PHQ2-9    Flowsheet Row Video Visit from 08/30/2021 in [facility] Video Visit from 06/14/2021 in [facility] Video Visit from 03/15/2021 in [facility] ED from 02/12/2021 in [place] Video Visit from 11/12/2020 in [facility]  PHQ-2 Total Score 2 0 0 2 0  PHQ-9 Total Score 9 8 4 9 4       Flowsheet Row ED from 02/12/2021 in [facility] Most recent reading at 02/12/2021  1:10 PM ED from 02/12/2021 in [place] Most recent reading at 02/12/2021 10:17 AM Video Visit from 11/12/2020 in [facility] Most recent reading at 11/12/2020  2:39 PM  C-SSRS RISK CATEGORY Error: Q3, 4, or 5 should not be populated when Q2 is No Error: Q3, 4, or 5 should not be populated when Q2 is No No Risk        Assessment and Plan: Patient endorses symptoms of ADHD but notes her mood, anxiety, depression, and sleep are good. Today she is agreeable to discontinue Strattera 40 mg and she found it ineffective (continue with also trialed in the past without success). She will start Adderall 10 mg daily to help manage symptoms of ADHD.   1. Bipolar 2 disorder, major depressive episode (HCC)  Continue- ARIPiprazole (ABILIFY) 20 MG tablet; Take 1 tablet (20 mg total) by mouth daily.  Dispense: 30 tablet; Refill: 3 Continue- FLUoxetine (PROZAC) 20 MG capsule; Take 1 capsule (20 mg total) by mouth daily.  Dispense: 30 capsule; Refill: 3  2. Generalized anxiety disorder  Continue- FLUoxetine (PROZAC) 20 MG capsule; Take 1 capsule (20 mg  total) by mouth daily.  Dispense: 30 capsule; Refill: 3  3. Attention deficit hyperactivity disorder (ADHD), predominantly inattentive type  Start- amphetamine-dextroamphetamine (ADDERALL) 10 MG tablet; Take 1 tablet (10 mg total) by mouth daily.  Dispense: 30 tablet; Refill: 0    Follow-up in 3 months Follow-up with therapy Salley Slaughter, NP 08/30/2021, 8:04 AM

## 2021-09-10 ENCOUNTER — Telehealth (HOSPITAL_COMMUNITY): Payer: Self-pay | Admitting: *Deleted

## 2021-09-10 NOTE — Telephone Encounter (Signed)
Patient is requesting for Adderall to be sent to [facility] on [address]. Current prescription has not been filled, as pharmacy does not have it in stock.  ?

## 2021-10-30 ENCOUNTER — Telehealth (HOSPITAL_COMMUNITY): Payer: Self-pay | Admitting: Psychiatry

## 2021-10-30 ENCOUNTER — Other Ambulatory Visit (HOSPITAL_COMMUNITY): Payer: Self-pay | Admitting: Physician Assistant

## 2021-10-30 DIAGNOSIS — F900 Attention-deficit hyperactivity disorder, predominantly inattentive type: Secondary | ICD-10-CM

## 2021-10-30 MED ORDER — AMPHETAMINE-DEXTROAMPHETAMINE 10 MG PO TABS: 10.0000 mg | ORAL_TABLET | Freq: Every day | ORAL | 0 refills | Status: DC

## 2021-10-30 NOTE — Telephone Encounter (Signed)
Provider was contacted by [facility] regarding medication refill. Patient has a follow up appointment scheduled for 11/13/2021 with Franne Grip, NP. Patient's medication to be e-prescribed to pharmacy of choice.

## 2021-10-30 NOTE — Progress Notes (Signed)
Provider was contacted by [facility] regarding medication refill. Patient has a follow up appointment scheduled for 11/13/2021 with Franne Grip, NP. Patient's medication to be e-prescribed to pharmacy of choice. ?

## 2021-10-30 NOTE — Telephone Encounter (Signed)
Patient is requesting to have Adderall refilled. Per last prescription by B Parsons:  ?amphetamine-dextroamphetamine (ADDERALL) 10 MG tablet [[id number]]  ?  Order Details ?Dose: 10 mg Route: Oral Frequency: Daily ?Dispense Quantity: 30 tablet Refills: 0  ?Note to Pharmacy: Please fill 2/24/223, 09/27/21, and 10/29/21 ?

## 2021-11-15 ENCOUNTER — Telehealth (HOSPITAL_COMMUNITY): Payer: Medicaid Other | Admitting: Psychiatry

## 2021-11-19 ENCOUNTER — Telehealth (INDEPENDENT_AMBULATORY_CARE_PROVIDER_SITE_OTHER): Payer: Medicaid Other | Admitting: Psychiatry

## 2021-11-19 DIAGNOSIS — F900 Attention-deficit hyperactivity disorder, predominantly inattentive type: Secondary | ICD-10-CM | POA: Diagnosis not present

## 2021-11-19 DIAGNOSIS — F3181 Bipolar II disorder: Secondary | ICD-10-CM

## 2021-11-19 NOTE — Progress Notes (Signed)
[facility] MD/PA/NP OP Progress Note ? ?11/19/2021 3:21 PM ?Michaela Williams  ?MRN:  [id number] ? ? ?Virtual Visit via Video Note ? ?I connected with Michaela Williams on 11/19/21 at  2:00 PM EDT by a video enabled telemedicine application and verified that I am speaking with the correct person using two identifiers. ? ?Location: ?Patient: home ?Provider: offsite ?  ?I discussed the limitations of evaluation and management by telemedicine and the availability of in person appointments. The patient expressed understanding and agreed to proceed. ? ?  ?I discussed the assessment and treatment plan with the patient. The patient was provided an opportunity to ask questions and all were answered. The patient agreed with the plan and demonstrated an understanding of the instructions. ?  ?The patient was advised to call back or seek an in-person evaluation if the symptoms worsen or if the condition fails to improve as anticipated. ? ?I provided 5 minutes of non-face-to-face time during this encounter. ? ? ?Franne Grip, NP  ? ?Chief Complaint: Medication management ? ?HPI: Michaela Williams is a 26 year old female presenting to [facility] outpatient for a follow up psychiatric evaluation.  Patient is being seen by this provider as her attending psychiatric provider is unavailable to complete today's appointment.  Patient has a psychiatric history of bipolar disorder, generalized anxiety disorder and ADHD.  Her symptoms are managed with Abilify 20 mg daily, Prozac 20 mg daily, and Adderall 10 mg daily.  Patient reports medication noncompliance, stating that she is a mother and is unable to function while taking the medications due to drowsiness.  This provider recommended that patient self administer Prozac and Abilify at night to reduce daytime drowsiness.  Patient reports that she tried to take the medications at nighttime but always forgot.  Patient agreed to set a timer on her cell phone to remind her to take her  medications at nighttime.  Patient also reports that she has not picked up her last medication supply of Adderall from the pharmacy since it being ordered on October 30, 2021 due to medication being on backorder. Patient denied the need for dosage adjustment today and prefers to maintain her current medication regimen. ? ?Patient is alert and oriented x4, calm, pleasant and willing to engage.  Patient is appropriately dressed for the weather and well-groomed.  She reports feeling anxious recently.  Patient denies suicidal or homicidal ideations, paranoia, delusional thought, auditory or visual hallucinations. ? ? ?Visit Diagnosis:  ?  ICD-10-CM   ?1. Attention deficit hyperactivity disorder (ADHD), predominantly inattentive type  F90.0   ?  ?2. Bipolar 2 disorder, major depressive episode ([place])  F31.81   ?  ? ? ?Past Psychiatric History: ADHD, GAD, Bipolar 2 disorder ? ?Past Medical History:  ?Past Medical History:  ?Diagnosis Date  ? Anemia   ? Anxiety   ? Asthma   ? in childhood   ? breast ca dx'd 05/2017  ? left  ? Breast cancer ([place]) 05/2017  ? dx at age 14  ? Depression   ? no meds in 3 yrs  ? Family history of breast cancer   ? Headache   ? migraines  ? Infection   ? UTI  ? Seizures ([place]) 2014  ? "seizure like activity", reaction to medicaiton, none since  ? UGI bleed 06/02/2017  ? Unwanted fertility 08/22/2020  ? BTL papers signed 08/20/20 Bilateral salpingectomy 09/2020  ?  ?Past Surgical History:  ?Procedure Laterality Date  ? ESOPHAGOGASTRODUODENOSCOPY (EGD) WITH  PROPOFOL N/A 06/03/2017  ? Procedure: ESOPHAGOGASTRODUODENOSCOPY (EGD) WITH PROPOFOL;  Surgeon: Ladene Artist, MD;  [facility];  Service: Endoscopy;  Laterality: N/A;  ? IUD REMOVAL N/A 09/26/2020  ? Procedure: INTRAUTERINE DEVICE (IUD) REMOVAL;  Surgeon: Chancy Milroy, MD;  Location: [place];  Service: Gynecology;  Laterality: N/A;  ? LAPAROSCOPIC BILATERAL SALPINGECTOMY Bilateral 09/26/2020  ? Procedure: LAPAROSCOPIC  BILATERAL SALPINGECTOMY;  Surgeon: Chancy Milroy, MD;  Location: [place];  Service: Gynecology;  Laterality: Bilateral;  ? ? ?Family Psychiatric History: see below ? ?Family History:  ?Family History  ?Adopted: Yes  ?Problem Relation Age of Onset  ? Mental illness Mother   ? Breast cancer Mother   ? Hypertension Maternal Grandmother   ? Diabetes Maternal Grandmother   ? Breast cancer Maternal Grandmother   ?     d. in her 48s, "dealing with breast cancer all her life"  ? Breast cancer Maternal Aunt   ? Cervical cancer Maternal Aunt   ? Breast cancer Sister   ? Breast cancer Maternal Aunt   ? Breast cancer Maternal Aunt   ? Breast cancer Maternal Aunt   ? Breast cancer Maternal Aunt   ? Breast cancer Half-Sister   ?     dx in her late 69s  ? Breast cancer Half-Sister   ?     dx in her late 96s  ? Cervical cancer Half-Sister   ? Breast cancer Half-Sister   ?     dx in her late 56s  ? Leukemia Nephew 4  ?     currently in his teens  ? ? ?Social History:  ?Social History  ? ?Socioeconomic History  ? Marital status: Single  ?  Spouse name: Not on file  ? Number of children: 1  ? Years of education: Not on file  ? Highest education level: Not on file  ?Occupational History  ? Occupation: unemployed  ?Tobacco Use  ? Smoking status: Former  ?  Types: Cigarettes  ?  Quit date: 12/19/2014  ?  Years since quitting: 6.9  ? Smokeless tobacco: Never  ? Tobacco comments:  ?  age 62  ?Vaping Use  ? Vaping Use: Former  ?Substance and Sexual Activity  ? Alcohol use: Not Currently  ?  Comment: occasionally  ? Drug use: No  ? Sexual activity: Not Currently  ?  Birth control/protection: None  ?Other Topics Concern  ? Not on file  ?Social History Narrative  ? Not on file  ? ?Social Determinants of Health  ? ?Financial Resource Strain: Not on file  ?Food Insecurity: Not on file  ?Transportation Needs: Not on file  ?Physical Activity: Not on file  ?Stress: Not on file  ?Social Connections: Not on file  ? ? ?Allergies:   ?Allergies  ?Allergen Reactions  ? Quetiapine Fumarate   ?  Other reaction(s): Other ?Black outs/oversedation  ? Flexeril [Cyclobenzaprine]   ?  Patient had a near syncopal event following Flexeril PO in MAU 11/21/2019  ? Lithium Other (See Comments)  ?  SEIZURES, Childhood reaction  ? Lexapro [Escitalopram] Other (See Comments)  ?  Didn't like Williams it made her feel  ? ? ?Metabolic Disorder Labs: ?Lab Results  ?Component Value Date  ? HGBA1C 5.0 07/03/2017  ? MPG 96.8 07/03/2017  ? MPG 96.8 07/01/2017  ? ?Lab Results  ?Component Value Date  ? PROLACTIN 184.0 (H) 07/01/2017  ? ?Lab Results  ?Component Value Date  ? CHOL  196 07/03/2017  ? TRIG 45 07/03/2017  ? HDL 63 07/03/2017  ? CHOLHDL 3.1 07/03/2017  ? VLDL 9 07/03/2017  ? LDLCALC 124 (H) 07/03/2017  ? ?Lab Results  ?Component Value Date  ? TSH 1.650 12/07/2018  ? TSH 1.350 01/06/2018  ? ? ?Therapeutic Level Labs: ?No results found for: LITHIUM ?No results found for: VALPROATE ?No components found for:  CBMZ ? ?Current Medications: ?Current Outpatient Medications  ?Medication Sig Dispense Refill  ? amphetamine-dextroamphetamine (ADDERALL) 10 MG tablet Take 1 tablet (10 mg total) by mouth daily. 30 tablet 0  ? ARIPiprazole (ABILIFY) 20 MG tablet Take 1 tablet (20 mg total) by mouth daily. 30 tablet 3  ? FLUoxetine (PROZAC) 20 MG capsule Take 1 capsule (20 mg total) by mouth daily. 30 capsule 3  ? ?No current facility-administered medications for this visit.  ? ? ? ?Musculoskeletal: ?Strength & Muscle Tone: n/a virtual visit ?Gait & Station: n/a ?Patient leans: n/a ? ?Psychiatric Specialty Exam: ?Review of Systems  ?Psychiatric/Behavioral:  Negative for hallucinations, self-injury and suicidal ideas.   ?All other systems reviewed and are negative.  ?currently breastfeeding.There is no height or weight on file to calculate BMI.  ?General Appearance: Well Groomed  ?Eye Contact:  Good  ?Speech:  Clear and Coherent  ?Volume:  Normal  ?Mood:  Euthymic  ?Affect:   Congruent  ?Thought Process:  Coherent  ?Orientation:  Full (Time, Place, and Person)  ?Thought Content: Logical   ?Suicidal Thoughts:  No  ?Homicidal Thoughts:  No  ?Memory:  good  ?Judgement:  Good  ?Insight:  G

## 2021-12-09 ENCOUNTER — Telehealth (HOSPITAL_COMMUNITY): Payer: Self-pay | Admitting: *Deleted

## 2021-12-09 NOTE — Telephone Encounter (Signed)
HEALTHY BLUE APPROVED : ARIPiprazole (ABILIFY) 20 MG tablet  P.A. # [id number]  EFFECTIVE: 12/09/21  TO 12/09/22

## 2021-12-18 ENCOUNTER — Telehealth (INDEPENDENT_AMBULATORY_CARE_PROVIDER_SITE_OTHER): Payer: Medicaid Other | Admitting: Psychiatry

## 2021-12-18 ENCOUNTER — Encounter (HOSPITAL_COMMUNITY): Payer: Self-pay | Admitting: Psychiatry

## 2021-12-18 DIAGNOSIS — F411 Generalized anxiety disorder: Secondary | ICD-10-CM

## 2021-12-18 DIAGNOSIS — F3181 Bipolar II disorder: Secondary | ICD-10-CM | POA: Diagnosis not present

## 2021-12-18 MED ORDER — ARIPIPRAZOLE 20 MG PO TABS: 20.0000 mg | ORAL_TABLET | Freq: Every day | ORAL | 3 refills | Status: DC

## 2021-12-18 MED ORDER — FLUOXETINE HCL 20 MG PO CAPS: 20.0000 mg | ORAL_CAPSULE | Freq: Every day | ORAL | 3 refills | Status: DC

## 2021-12-18 NOTE — Progress Notes (Signed)
[place] MD/PA/NP OP Progress Note    Virtual Visit via Telephone Note  I connected with Michaela Williams on 12/18/21 at  1:30 PM EDT by telephone and verified that I am speaking with the correct person using two identifiers.  Location: Patient: home Provider: Clinic   I discussed the limitations, risks, security and privacy concerns of performing an evaluation and management service by telephone and the availability of in person appointments. I also discussed with the patient that there may be a patient responsible charge related to this service. The patient expressed understanding and agreed to proceed.   I provided 30 minutes of non-face-to-face time during this encounter.    12/18/2021 1:42 PM Michaela Williams  MRN:  [id number]  Chief Complaint: "Everything is okay"  HPI: 26 year old female seen today for follow up psychiatric evaluation.  She has a psychiatric history of bipolar 1 disorder, bipolar 2 disorder, adjustment disorder, anxiety, and depression.  She is currently managed on  Adderall 10 mg daily, Abilify 20, and Prozac 20 mg.  She notes that her medications are effective in managing her psychiatric conditions   Today she was unable to logon virtually so her assessment was done over the phone.  During exam she was pleasant, cooperative, and engaged in conversation.  She informed provider that things has been going well since her last visit.  She informed Probation officer that she has minimal anxiety and depression.  Patient reports that she is unable to do a GAD-7 or PHQ-9 today as she is caring for her 51 year old daughter.  She informed Probation officer that since starting Adderall her attention has been better.  She reports that last month she was unable to receive her prescription because it was on backorder but notes that now she has received it and is doing well.  Today she denies SI/HI/VAH, mania, paranoia.    Patient continues to study at [employer] and reports that she will finish in January.     Medication changes made today.  Patient agreeable to continue medication as prescribed.  Adderall not refilled today as it was recently filled on 12/07/2021.  She will follow-up with outpatient counseling for therapy.  No other concerns noted at this time.    Visit Diagnosis:    ICD-10-CM   1. Bipolar 2 disorder, major depressive episode (HCC)  F31.81 FLUoxetine (PROZAC) 20 MG capsule    ARIPiprazole (ABILIFY) 20 MG tablet    2. Generalized anxiety disorder  F41.1 FLUoxetine (PROZAC) 20 MG capsule      Past Psychiatric History: bipolar 1 disorder, bipolar 2 disorder, adjustment disorder, anxiety, and depression.    Past Medical History:  Past Medical History:  Diagnosis Date   Anemia    Anxiety    Asthma    in childhood    breast ca dx'd 05/2017   left   Breast cancer ([place]) 05/2017   dx at age 39   Depression    no meds in 3 yrs   Family history of breast cancer    Headache    migraines   Infection    UTI   Seizures ([place]) 2014   "seizure like activity", reaction to medicaiton, none since   UGI bleed 06/02/2017   Unwanted fertility 08/22/2020   BTL papers signed 08/20/20 Bilateral salpingectomy 09/2020    Past Surgical History:  Procedure Laterality Date   ESOPHAGOGASTRODUODENOSCOPY (EGD) WITH PROPOFOL N/A 06/03/2017   Procedure: ESOPHAGOGASTRODUODENOSCOPY (EGD) WITH PROPOFOL;  Surgeon: Ladene Artist, MD;  [facility];  Service: Endoscopy;  Laterality: N/A;   IUD REMOVAL N/A 09/26/2020   Procedure: INTRAUTERINE DEVICE (IUD) REMOVAL;  Surgeon: Chancy Milroy, MD;  Location: [place];  Service: Gynecology;  Laterality: N/A;   LAPAROSCOPIC BILATERAL SALPINGECTOMY Bilateral 09/26/2020   Procedure: LAPAROSCOPIC BILATERAL SALPINGECTOMY;  Surgeon: Chancy Milroy, MD;  Location: [place];  Service: Gynecology;  Laterality: Bilateral;    Family Psychiatric History: Patient was adopted but noted that she was told that her biological  mother may have had bipolar disorder or schizophrenia  Family History:  Family History  Adopted: Yes  Problem Relation Age of Onset   Mental illness Mother    Breast cancer Mother    Hypertension Maternal Grandmother    Diabetes Maternal Grandmother    Breast cancer Maternal Grandmother        d. in her 59s, "dealing with breast cancer all her life"   Breast cancer Maternal Aunt    Cervical cancer Maternal Aunt    Breast cancer Sister    Breast cancer Maternal Aunt    Breast cancer Maternal Aunt    Breast cancer Maternal Aunt    Breast cancer Maternal Aunt    Breast cancer Half-Sister        dx in her late 17s   Breast cancer Half-Sister        dx in her late 27s   Cervical cancer Half-Sister    Breast cancer Half-Sister        dx in her late 32s   Leukemia Nephew 4       currently in his teens    Social History:  Social History   Socioeconomic History   Marital status: Single    Spouse name: Not on file   Number of children: 1   Years of education: Not on file   Highest education level: Not on file  Occupational History   Occupation: unemployed  Tobacco Use   Smoking status: Former    Types: Cigarettes    Quit date: 12/19/2014    Years since quitting: 7.0   Smokeless tobacco: Never   Tobacco comments:    age 37  Vaping Use   Vaping Use: Former  Substance and Sexual Activity   Alcohol use: Not Currently    Comment: occasionally   Drug use: No   Sexual activity: Not Currently    Birth control/protection: None  Other Topics Concern   Not on file  Social History Narrative   Not on file   Social Determinants of Health   Financial Resource Strain: Not on file  Food Insecurity: Not on file  Transportation Needs: Not on file  Physical Activity: Not on file  Stress: Not on file  Social Connections: Not on file    Allergies:  Allergies  Allergen Reactions   Quetiapine Fumarate     Other reaction(s): Other Black outs/oversedation   Flexeril  [Cyclobenzaprine]     Patient had a near syncopal event following Flexeril PO in MAU 11/21/2019   Lithium Other (See Comments)    SEIZURES, Childhood reaction   Lexapro [Escitalopram] Other (See Comments)    Didn't like how it made her feel    Metabolic Disorder Labs: Lab Results  Component Value Date   HGBA1C 5.0 07/03/2017   MPG 96.8 07/03/2017   MPG 96.8 07/01/2017   Lab Results  Component Value Date   PROLACTIN 184.0 (H) 07/01/2017   Lab Results  Component Value Date   CHOL 196 07/03/2017  TRIG 45 07/03/2017   HDL 63 07/03/2017   CHOLHDL 3.1 07/03/2017   VLDL 9 07/03/2017   LDLCALC 124 (H) 07/03/2017   Lab Results  Component Value Date   TSH 1.650 12/07/2018   TSH 1.350 01/06/2018    Therapeutic Level Labs: No results found for: "LITHIUM" No results found for: "VALPROATE" No results found for: "CBMZ"  Current Medications: Current Outpatient Medications  Medication Sig Dispense Refill   amphetamine-dextroamphetamine (ADDERALL) 10 MG tablet Take 1 tablet (10 mg total) by mouth daily. 30 tablet 0   ARIPiprazole (ABILIFY) 20 MG tablet Take 1 tablet (20 mg total) by mouth daily. 30 tablet 3   FLUoxetine (PROZAC) 20 MG capsule Take 1 capsule (20 mg total) by mouth daily. 30 capsule 3   No current facility-administered medications for this visit.     Musculoskeletal: Strength & Muscle Tone:  Unable to assess due to telephone visit Gait & Station:  Unable to assess due to telephone visit Patient leans: N/A  Psychiatric Specialty Exam: Review of Systems  currently breastfeeding.There is no height or weight on file to calculate BMI.  General Appearance:  Unable to assess due to telephone visit  Eye Contact:   Unable to assess due to telephone visit  Speech:  Clear and Coherent and Normal Rate  Volume:  Normal  Mood:  Euthymic  Affect:  Congruent and Non-Congruent  Thought Process:  Coherent, Goal Directed and Linear  Orientation:  Full (Time, Place, and  Person)  Thought Content: WDL and Logical   Suicidal Thoughts:  No  Homicidal Thoughts:  No  Memory:  Immediate;   Good Recent;   Good Remote;   Good  Judgement:  Good  Insight:  Good  Psychomotor Activity:  Normal  Concentration:  Concentration: Good and Attention Span: Good  Recall:  Good  Fund of Knowledge: Good  Language: Good  Akathisia:   Unable to assess due to telephone visit  Handed:  Right  AIMS (if indicated):Not done  Assets:  Communication Skills Desire for Improvement Financial Resources/Insurance Housing Intimacy Social Support  ADL's:  Intact  Cognition: WNL  Sleep:  Good   Screenings: AIMS    Flowsheet Row Admission (Discharged) from OP Visit from 07/01/2017 in [place] [facility]  AIMS Total Score 0      AUDIT    Flowsheet Row Admission (Discharged) from OP Visit from 07/01/2017 in [place] [facility]  Alcohol Use Disorder Identification Test Final Score (AUDIT) 1      GAD-7    Flowsheet Row Video Visit from 08/30/2021 in [facility] Video Visit from 06/14/2021 in [facility] Video Visit from 03/15/2021 in [facility] Video Visit from 11/12/2020 in [facility] Video Visit from 08/15/2020 in [facility]  Total GAD-7 Score '10 8 12 6 14      '$ PHQ2-9    Flowsheet Row Video Visit from 08/30/2021 in [facility] Video Visit from 06/14/2021 in [facility] Video Visit from 03/15/2021 in [facility] ED from 02/12/2021 in [place] Video Visit from 11/12/2020 in [facility]  PHQ-2 Total Score 2 0 0 2 0  PHQ-9 Total Score '9 8 4 9 4      '$ Flowsheet Row ED from 02/12/2021 in [facility] Most recent  reading at 02/12/2021  1:10 PM ED from 02/12/2021 in [facility] Most recent reading at 02/12/2021 10:17 AM Video Visit from 11/12/2020 in [facility] Most recent reading at 11/12/2020  2:39 PM  C-SSRS RISK CATEGORY Error: Q3, 4, or 5 should not be populated when Q2 is No Error: Q3, 4, or 5 should not be populated when Q2 is No No Risk        Assessment and Plan: Patient notes that she is doing well on her current medication regimen.  No medication changes made today.  Patient agreeable to continue medication as prescribed.  At this time Adderall not refilled as it was recently filled on 12/07/2021   1. Bipolar 2 disorder, major depressive episode (HCC)  Continue- ARIPiprazole (ABILIFY) 20 MG tablet; Take 1 tablet (20 mg total) by mouth daily.  Dispense: 30 tablet; Refill: 3 Continue- FLUoxetine (PROZAC) 20 MG capsule; Take 1 capsule (20 mg total) by mouth daily.  Dispense: 30 capsule; Refill: 3  2. Generalized anxiety disorder  Continue- FLUoxetine (PROZAC) 20 MG capsule; Take 1 capsule (20 mg total) by mouth daily.  Dispense: 30 capsule; Refill: 3     Follow-up in 3 months Follow-up with therapy Salley Slaughter, NP 12/18/2021, 1:42 PM

## 2022-01-08 ENCOUNTER — Ambulatory Visit (INDEPENDENT_AMBULATORY_CARE_PROVIDER_SITE_OTHER): Payer: Medicaid Other | Admitting: Licensed Clinical Social Worker

## 2022-01-08 ENCOUNTER — Encounter (HOSPITAL_COMMUNITY): Payer: Self-pay

## 2022-01-08 DIAGNOSIS — F3130 Bipolar disorder, current episode depressed, mild or moderate severity, unspecified: Secondary | ICD-10-CM | POA: Diagnosis not present

## 2022-01-08 NOTE — Plan of Care (Signed)
  Problem: Depression CCP Problem  1 Learn and Apply Coping Skills to Decrease Depression and Trauma Symptoms   Goal: LTG: Sabine will report a decrease in feelings of shame and disgust surrounding sexual intercourse. Outcome: Not Applicable Goal: LTG: Mayling WILL SCORE LESS THAN 10 ON THE PATIENT HEALTH QUESTIONNAIRE (PHQ-9) Outcome: Not Applicable Goal: STG: Jinx WILL PARTICIPATE IN AT LEAST 80% OF SCHEDULED INDIVIDUAL PSYCHOTHERAPY SESSIONS Outcome: Not Applicable Goal: STG: Maddyn WILL COMPLETE AT LEAST 80% OF ASSIGNED HOMEWORK Outcome: Not Applicable Goal: STG: Seara WILL IDENTIFY AT LEAST 3 COGNITIVE PATTERNS AND BELIEFS THAT SUPPORT DEPRESSION Outcome: Not Applicable
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
## 2022-03-20 ENCOUNTER — Ambulatory Visit (HOSPITAL_COMMUNITY): Payer: Medicaid Other

## 2022-03-20 ENCOUNTER — Encounter (HOSPITAL_COMMUNITY): Payer: Self-pay | Admitting: Psychiatry

## 2022-03-20 ENCOUNTER — Telehealth (INDEPENDENT_AMBULATORY_CARE_PROVIDER_SITE_OTHER): Payer: Medicaid Other | Admitting: Psychiatry

## 2022-03-20 DIAGNOSIS — F3181 Bipolar II disorder: Secondary | ICD-10-CM | POA: Diagnosis not present

## 2022-03-20 DIAGNOSIS — F900 Attention-deficit hyperactivity disorder, predominantly inattentive type: Secondary | ICD-10-CM

## 2022-03-20 DIAGNOSIS — F411 Generalized anxiety disorder: Secondary | ICD-10-CM

## 2022-03-20 MED ORDER — FLUOXETINE HCL 10 MG PO CAPS: 10.0000 mg | ORAL_CAPSULE | Freq: Every day | ORAL | 3 refills | Status: DC

## 2022-03-20 MED ORDER — AMPHETAMINE-DEXTROAMPHETAMINE 10 MG PO TABS: 10.0000 mg | ORAL_TABLET | Freq: Every day | ORAL | 0 refills | Status: DC

## 2022-03-20 MED ORDER — ARIPIPRAZOLE 10 MG PO TABS: 10.0000 mg | ORAL_TABLET | Freq: Every day | ORAL | 0 refills | Status: DC

## 2022-03-20 MED ORDER — ABILIFY MAINTENA 400 MG IM PRSY: 400.0000 mg | PREFILLED_SYRINGE | INTRAMUSCULAR | 12 refills | Status: DC

## 2022-03-20 MED ORDER — ARIPIPRAZOLE ER 400 MG IM PRSY
400.0000 mg | PREFILLED_SYRINGE | INTRAMUSCULAR | Status: DC
Start: 2022-03-25 — End: 2022-11-13
  Administered 2022-03-27 – 2022-10-16 (×4): 400 mg via INTRAMUSCULAR

## 2022-03-20 NOTE — Progress Notes (Signed)
[facility] MD/PA/NP OP Progress Note    Virtual Visit via Telephone Note  I connected with Michaela Williams on 03/20/22 at  2:30 PM EDT by telephone and verified that I am speaking with the correct person using two identifiers.  Location: Patient: home Provider: Clinic   I discussed the limitations, risks, security and privacy concerns of performing an evaluation and management service by telephone and the availability of in person appointments. I also discussed with the patient that there may be a patient responsible charge related to this service. The patient expressed understanding and agreed to proceed.   I provided 30 minutes of non-face-to-face time during this encounter.    03/20/2022 1:00 PM Michaela Williams  MRN:  [id number]  Chief Complaint: "I have not been taking Abilify and Prozac"  HPI: 26 year old female seen today for follow up psychiatric evaluation.  She has a psychiatric history of bipolar 1 disorder, bipolar 2 disorder, adjustment disorder, anxiety, and depression.  She is currently managed on  Adderall 10 mg daily, Abilify 20, and Prozac 20 mg.  She notes that  she has not taken her medications since March.  Today she was unable to logon virtually so her assessment was done over the phone.  During exam she was pleasant, cooperative, and engaged in conversation.  She informed provider that she has not taken Abilify and Prozac since March. Recently she notes that she has been more distracted, impulsive/reckless diving, having racing thoughts, and fluctuations in mood. Patient also notes that to focus she listens to audible books. She informed Probation officer that she has been more withdrawn and sad. She notes that has been suffering from anhedonia and does not care for things she once did.  She reports feeling lonely and notes that she talks to herself to cope.  She informed Probation officer that it [place] difficult to speak with loved ones such as her mother.  Today provider conducted a GAD 7 and patient  scored a 17.  She informed Probation officer that she worries about her future, school, and her children.  Provider also conducted a PHQ 9 and patient scored a 24.she endorses fluctuations in appetite and sleep.  Patient notes that her weight fluctuates up and down 10 pounds.  Today she denies SI/HI/VAH or paranoia.  She does endorse passive SI however denies wanting to harm herself.  She informed Probation officer that she has to live for her children.    Patient informed Probation officer that she continues to study at [employer] and notes that school is going well.  Patient informed writer that she would like to have the Abilify injection as she was more compliant on it and felt mentally stable.  She informed Probation officer that she discontinued in the past because her insurance would not cover it.  She now has Medicaid which will cover her medication.  Provider recommended patient taking Abilify 10 mg for a week prior to restarting injection.  She endorsed understanding and agreed.  Patient notes that Prozac works well for her and would like to start Prozac to help manage her anxiety and depression.  Prozac 10 mg daily restarted.  She will follow-up with outpatient counseling for therapy.  Adderall also refilled.  No other concerns noted at this time.     Visit Diagnosis:    ICD-10-CM   1. Bipolar 2 disorder, major depressive episode (HCC)  F31.81 ARIPiprazole ER (ABILIFY MAINTENA) 400 MG prefilled syringe 400 mg    FLUoxetine (PROZAC) 10 MG capsule    ARIPiprazole (ABILIFY) 10  MG tablet    ARIPiprazole ER (ABILIFY MAINTENA) 400 MG PRSY prefilled syringe    2. Generalized anxiety disorder  F41.1 FLUoxetine (PROZAC) 10 MG capsule    3. Attention deficit hyperactivity disorder (ADHD), predominantly inattentive type  F90.0 amphetamine-dextroamphetamine (ADDERALL) 10 MG tablet      Past Psychiatric History: bipolar 1 disorder, bipolar 2 disorder, adjustment disorder, anxiety, and depression.    Past Medical History:  Past Medical History:   Diagnosis Date   Anemia    Anxiety    Asthma    in childhood    breast ca dx'd 05/2017   left   Breast cancer ([place]) 05/2017   dx at age 89   Depression    no meds in 3 yrs   Family history of breast cancer    Headache    migraines   Infection    UTI   Seizures ([place]) 2014   "seizure like activity", reaction to medicaiton, none since   UGI bleed 06/02/2017   Unwanted fertility 08/22/2020   BTL papers signed 08/20/20 Bilateral salpingectomy 09/2020    Past Surgical History:  Procedure Laterality Date   ESOPHAGOGASTRODUODENOSCOPY (EGD) WITH PROPOFOL N/A 06/03/2017   Procedure: ESOPHAGOGASTRODUODENOSCOPY (EGD) WITH PROPOFOL;  Surgeon: Ladene Artist, MD;  Location: [facility];  Service: Endoscopy;  Laterality: N/A;   IUD REMOVAL N/A 09/26/2020   Procedure: INTRAUTERINE DEVICE (IUD) REMOVAL;  Surgeon: Chancy Milroy, MD;  Location: [facility];  Service: Gynecology;  Laterality: N/A;   LAPAROSCOPIC BILATERAL SALPINGECTOMY Bilateral 09/26/2020   Procedure: LAPAROSCOPIC BILATERAL SALPINGECTOMY;  Surgeon: Chancy Milroy, MD;  Location: [facility];  Service: Gynecology;  Laterality: Bilateral;    Family Psychiatric History: Patient was adopted but noted that she was told that her biological mother may have had bipolar disorder or schizophrenia  Family History:  Family History  Adopted: Yes  Problem Relation Age of Onset   Mental illness Mother    Breast cancer Mother    Hypertension Maternal Grandmother    Diabetes Maternal Grandmother    Breast cancer Maternal Grandmother        d. in her 100s, "dealing with breast cancer all her life"   Breast cancer Maternal Aunt    Cervical cancer Maternal Aunt    Breast cancer Sister    Breast cancer Maternal Aunt    Breast cancer Maternal Aunt    Breast cancer Maternal Aunt    Breast cancer Maternal Aunt    Breast cancer Half-Sister        dx in her late 7s   Breast cancer Half-Sister        dx in  her late 17s   Cervical cancer Half-Sister    Breast cancer Half-Sister        dx in her late 37s   Leukemia Nephew 4       currently in his teens    Social History:  Social History   Socioeconomic History   Marital status: Single    Spouse name: Not on file   Number of children: 1   Years of education: Not on file   Highest education level: Not on file  Occupational History   Occupation: unemployed  Tobacco Use   Smoking status: Former    Types: Cigarettes    Quit date: 12/19/2014    Years since quitting: 7.2   Smokeless tobacco: Never   Tobacco comments:    age 72  Vaping Use   Vaping Use: Former  Substance and Sexual Activity   Alcohol use: Not Currently    Comment: occasionally   Drug use: No   Sexual activity: Not Currently    Birth control/protection: None  Other Topics Concern   Not on file  Social History Narrative   Not on file   Social Determinants of Health   Financial Resource Strain: Not on file  Food Insecurity: Not on file  Transportation Needs: Not on file  Physical Activity: Not on file  Stress: Not on file  Social Connections: Not on file    Allergies:  Allergies  Allergen Reactions   Quetiapine Fumarate     Other reaction(s): Other Black outs/oversedation   Flexeril [Cyclobenzaprine]     Patient had a near syncopal event following Flexeril PO in MAU 11/21/2019   Lithium Other (See Comments)    SEIZURES, Childhood reaction   Lexapro [Escitalopram] Other (See Comments)    Didn't like how it made her feel    Metabolic Disorder Labs: Lab Results  Component Value Date   HGBA1C 5.0 07/03/2017   MPG 96.8 07/03/2017   MPG 96.8 07/01/2017   Lab Results  Component Value Date   PROLACTIN 184.0 (H) 07/01/2017   Lab Results  Component Value Date   CHOL 196 07/03/2017   TRIG 45 07/03/2017   HDL 63 07/03/2017   CHOLHDL 3.1 07/03/2017   VLDL 9 07/03/2017   LDLCALC 124 (H) 07/03/2017   Lab Results  Component Value Date   TSH 1.650  12/07/2018   TSH 1.350 01/06/2018    Therapeutic Level Labs: No results found for: "LITHIUM" No results found for: "VALPROATE" No results found for: "CBMZ"  Current Medications: Current Outpatient Medications  Medication Sig Dispense Refill   ARIPiprazole (ABILIFY) 10 MG tablet Take 1 tablet (10 mg total) by mouth daily. 7 tablet 0   ARIPiprazole ER (ABILIFY MAINTENA) 400 MG PRSY prefilled syringe Inject 400 mg into the muscle every 28 (twenty-eight) days. 1 each 12   amphetamine-dextroamphetamine (ADDERALL) 10 MG tablet Take 1 tablet (10 mg total) by mouth daily. 30 tablet 0   FLUoxetine (PROZAC) 10 MG capsule Take 1 capsule (10 mg total) by mouth daily. 30 capsule 3   Current Facility-Administered Medications  Medication Dose Route Frequency Provider Last Rate Last Admin   [START ON 03/25/2022] ARIPiprazole ER (ABILIFY MAINTENA) 400 MG prefilled syringe 400 mg  400 mg Intramuscular Q28 days Salley Slaughter, NP         Musculoskeletal: Strength & Muscle Tone:  Unable to assess due to telephone visit [place]:  Unable to assess due to telephone visit Patient leans: N/A  Psychiatric Specialty Exam: Review of Systems  currently breastfeeding.There is no height or weight on file to calculate BMI.  General Appearance:  Unable to assess due to telephone visit  Eye Contact:   Unable to assess due to telephone visit  Speech:  Clear and Coherent and Normal Rate  Volume:  Normal  Mood:  Anxious and Depressed  Affect:  Congruent and Non-Congruent  Thought Process:  Coherent, Goal Directed and Linear  Orientation:  Full (Time, Place, and Person)  Thought Content: WDL and Logical   Suicidal Thoughts:  Yes.  without intent/plan  Homicidal Thoughts:  No  Memory:  Immediate;   Good Recent;   Good Remote;   Good  Judgement:  Good  Insight:  Good  Psychomotor Activity:   Unable to assess due to telephone visit  Concentration:  Concentration: Good and Attention Span: Good  Recall:  Roel Cluck of Knowledge: Good  Language: Good  Akathisia:   Unable to assess due to telephone visit  Handed:  Right  AIMS (if indicated):Not done  Assets:  Communication Skills Desire for Improvement Financial Resources/Insurance Housing Intimacy Social Support  ADL's:  Intact  Cognition: WNL  Sleep:  Fair   Screenings: AIMS    Flowsheet Row Admission (Discharged) from OP Visit from 07/01/2017 in [facility]  AIMS Total Score 0      AUDIT    Flowsheet Row Admission (Discharged) from OP Visit from 07/01/2017 in [facility]  Alcohol Use Disorder Identification Test Final Score (AUDIT) 1      GAD-7    Flowsheet Row Video Visit from 03/20/2022 in [facility] Video Visit from 08/30/2021 in [facility] Video Visit from 06/14/2021 in [facility] Video Visit from 03/15/2021 in [facility] Video Visit from 11/12/2020 in [facility]  Total GAD-7 Score '17 10 8 12 6      '$ PHQ2-9    Flowsheet Row Video Visit from 03/20/2022 in [facility] Video Visit from 08/30/2021 in [facility] Video Visit from 06/14/2021 in [facility] Video Visit from 03/15/2021 in [facility] ED from 02/12/2021 in [facility]  PHQ-2 Total Score 5 2 0 0 2  PHQ-9 Total Score '24 9 8 4 9      '$ Flowsheet Row Video Visit from 03/20/2022 in [facility] Most recent reading at 03/20/2022 12:40 PM ED from 02/12/2021 in [facility] Most recent reading at 02/12/2021  1:10 PM ED from 02/12/2021 in [place] [facility] Most recent reading at 02/12/2021 10:17 AM  C-SSRS RISK CATEGORY Error:  Q7 should not be populated when Q6 is No Error: Q3, 4, or 5 should not be populated when Q2 is No Error: Q3, 4, or 5 should not be populated when Q2 is No        Assessment and Plan: Patient notes that she has not taken medication since March and has been feeling more depressed and hypomanic. Patient informed writer that she would like to have the Abilify injection as she was more compliant on it and felt mentally stable.  She informed Probation officer that she discontinued in the past because her insurance would not cover it.  She now has Medicaid which will cover her medication.  Provider recommended patient taking Abilify 10 mg for a week prior to restarting injection.  She endorsed understanding and agreed.  Patient notes that Prozac works well for her and would like to start Prozac to help manage her anxiety and depression.  Prozac 10 mg daily restarted.  She will follow-up with outpatient counseling for therapy.  Adderall also refilled.  1. Bipolar 2 disorder, major depressive episode ([place])  Start- ARIPiprazole ER (ABILIFY MAINTENA) 400 MG prefilled syringe 400 mg Restart- FLUoxetine (PROZAC) 10 MG capsule; Take 1 capsule (10 mg total) by mouth daily.  Dispense: 30 capsule; Refill: 3 Start- ARIPiprazole (ABILIFY) 10 MG tablet; Take 1 tablet (10 mg total) by mouth daily.  Dispense: 7 tablet; Refill: 0 Start- ARIPiprazole ER (ABILIFY MAINTENA) 400 MG PRSY prefilled syringe; Inject 400 mg into the muscle every 28 (twenty-eight) days.  Dispense: 1 each; Refill: 12  2. Generalized anxiety  disorder  Restart- FLUoxetine (PROZAC) 10 MG capsule; Take 1 capsule (10 mg total) by mouth daily.  Dispense: 30 capsule; Refill: 3  3. Attention deficit hyperactivity disorder (ADHD), predominantly inattentive type  Continue- amphetamine-dextroamphetamine (ADDERALL) 10 MG tablet; Take 1 tablet (10 mg total) by mouth daily.  Dispense: 30 tablet; Refill: 0      Follow-up in 3 months Follow-up with  therapy Salley Slaughter, NP 03/20/2022, 1:00 PM

## 2022-03-24 ENCOUNTER — Ambulatory Visit (INDEPENDENT_AMBULATORY_CARE_PROVIDER_SITE_OTHER): Payer: Medicaid Other | Admitting: Licensed Clinical Social Worker

## 2022-03-24 DIAGNOSIS — F3181 Bipolar II disorder: Secondary | ICD-10-CM

## 2022-03-24 DIAGNOSIS — F411 Generalized anxiety disorder: Secondary | ICD-10-CM

## 2022-03-24 NOTE — Progress Notes (Signed)
Virtual Visit via Video Note  I connected with [name] on 03/24/22 at  3:00 PM EDT by a video enabled telemedicine application and verified that I am speaking with the correct person using two identifiers.  Location: Patient: pt's home Provider: clinical office   I discussed the limitations of evaluation and management by telemedicine and the availability of in person appointments. The patient expressed understanding and agreed to proceed.   I discussed the assessment and treatment plan with the patient. The patient was provided an opportunity to ask questions and all were answered. The patient agreed with the plan and demonstrated an understanding of the instructions.   The patient was advised to call back or seek an in-person evaluation if the symptoms worsen or if the condition fails to improve as anticipated.  I provided 26 minutes of non-face-to-face time during this encounter.   Heron Nay, LCSWA    THERAPIST PROGRESS NOTE  Session Time: 26 minutes  Participation Level: Active  Behavioral Response:  UTAAlertAnxious and Depressed  Type of Therapy: Individual Therapy  Treatment Goals addressed: depression  ProgressTowards Goals: Progressing  Interventions: CBT and Supportive  Summary: ANGELEEN HORNEY is a 26 y.o. female who presents for f/u with this cln. She joins the session on time but is not visible due to poor lighting. She states she has been struggling with depression. She states she has been more isolated and has a hard time connecting to others. She reports a lack of intimacy and feeling emotionally safe with her partner. "I kind of feel like I'm around people but I'm still alone." She reports having always felt this way but that this has worsened. She reports experiencing significant social anxiety. She states she relies on her partner for all of her social needs and believes it's too late for her to learn how to make friends. She becomes tearful when  discussing how she feels like she is both not enough and too much and that she feels like an outsider. She states she feels socially awkward and that people aren't interested in the things she is interested in. She states it was been previously suspected that she has ASD but she has never been formally tested. Pt requests to end the session early due to her children crying.   Suicidal/Homicidal: Nowithout intent/plan  Therapist Response: Cln assessed for current stressors, symptoms, and safety since last session. Cln utilized active listening and validation to assist with processing. Cln pointed out when pt was indicating cognitive distortions, such as mind reading, and recommended psychological testing due to reported social dysfunction. Cln also recommended attempting to connect with others online who consider themselves socially awkward, to which pt was not receptive. Cln informed pt that cln is being asked to move some pts off schedule and asked if she is willing to see a different therapist due to only having seen this cln once before. Pt agreeable. Cln confirmed availability and preferred method of service delivery (virtual) and requested f/u appt be made with office staff.  Plan: Return for next available appt with a new clinician  Diagnosis: Bipolar 2 disorder, major depressive episode ([place])  Generalized anxiety disorder  Collaboration of Care: Other none required for this visit  Patient/Guardian was advised Release of Information must be obtained prior to any record release in order to collaborate their care with an outside provider. Patient/Guardian was advised if they have not already done so to contact the registration department to sign all necessary forms in order  for [place] to release information regarding their care.   Consent: Patient/Guardian gives verbal consent for treatment and assignment of benefits for services provided during this visit. Patient/Guardian expressed understanding  and agreed to proceed.   Heron Nay, LCSWA 03/24/2022

## 2022-03-25 ENCOUNTER — Telehealth (HOSPITAL_COMMUNITY): Payer: Self-pay | Admitting: *Deleted

## 2022-03-25 IMAGING — US US MFM OB FOLLOW-UP
1 series · 14 of 27 positions shown · non-contrast
Comparison: none

[Series 1: us mfm ob follow-up · 27 acquisitions, 14 frames shown]
[im 1/27]
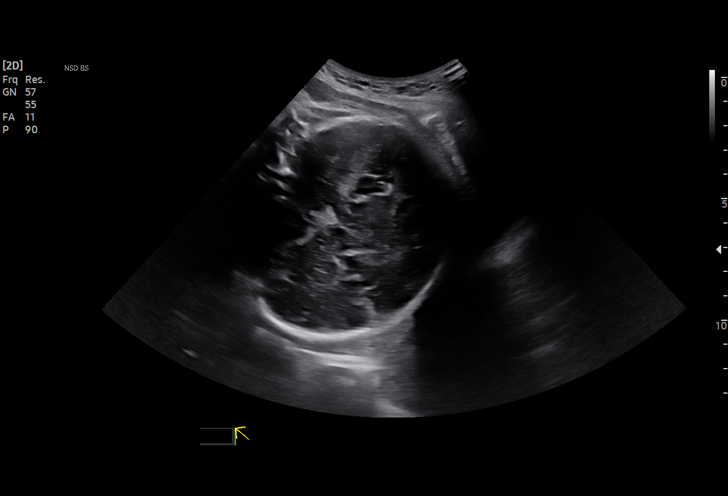
[im 3/27]
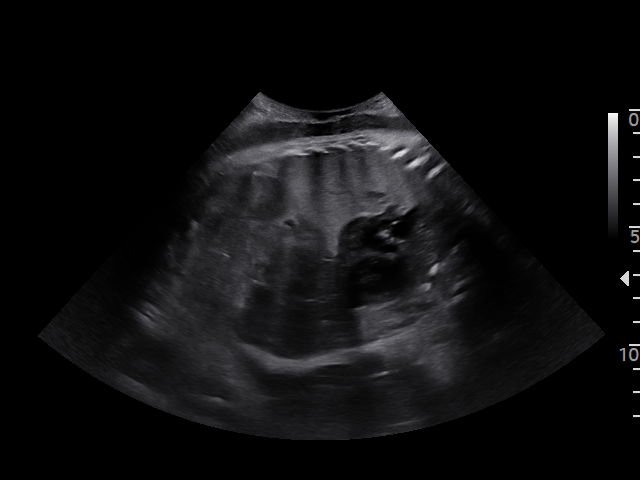
[im 5/27]
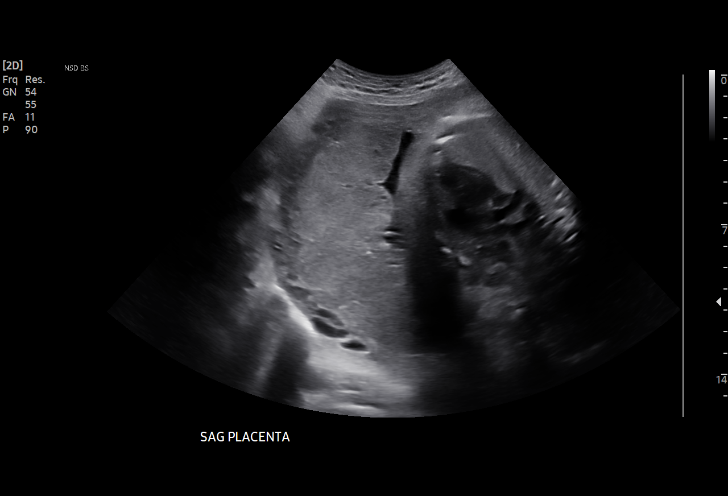
[im 7/27]
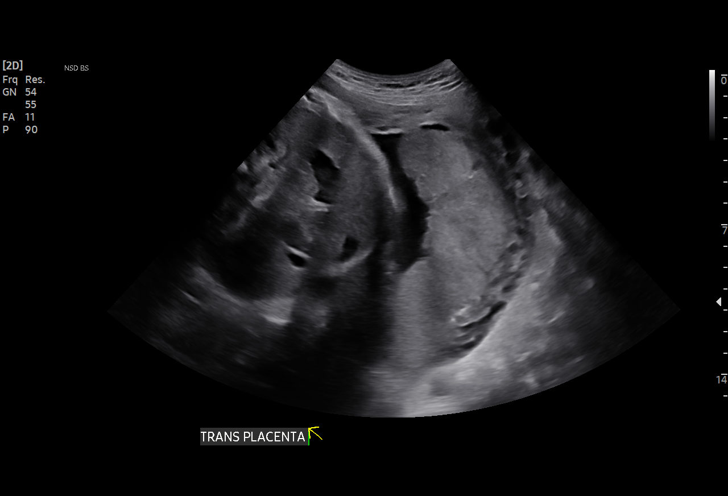
[im 9/27]
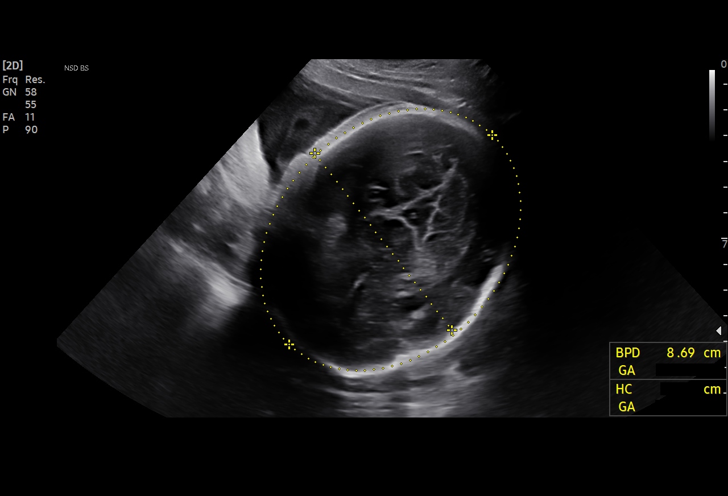
[im 11/27]
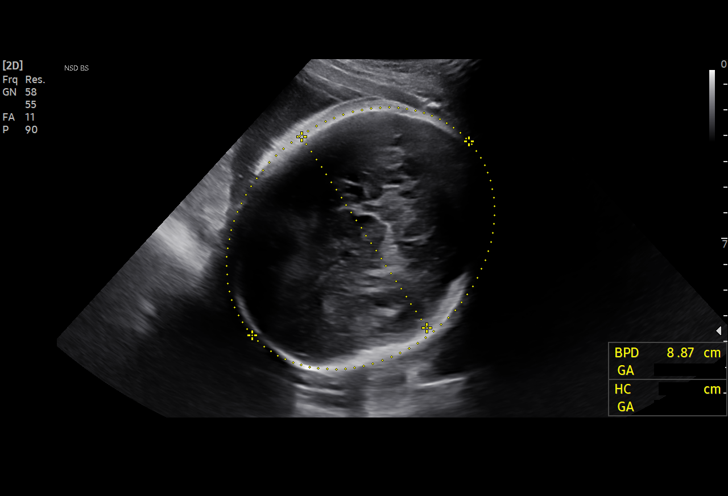
[im 13/27]
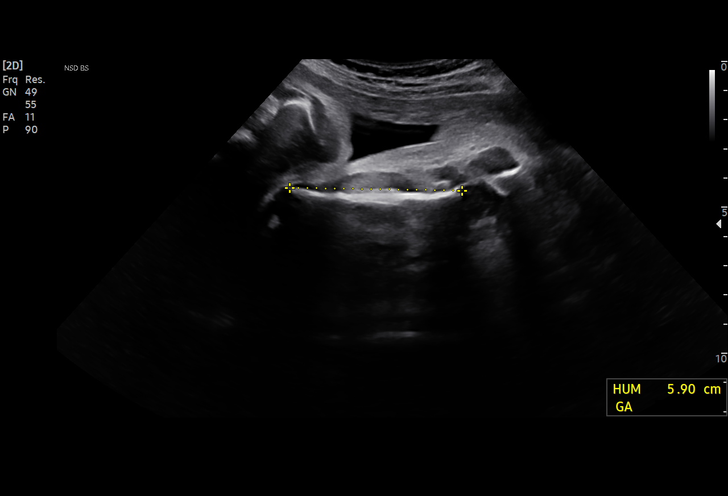
[im 15/27]
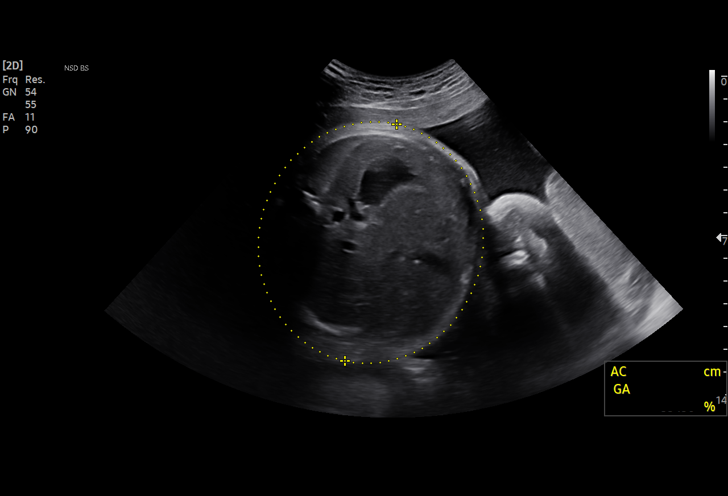
[im 17/27]
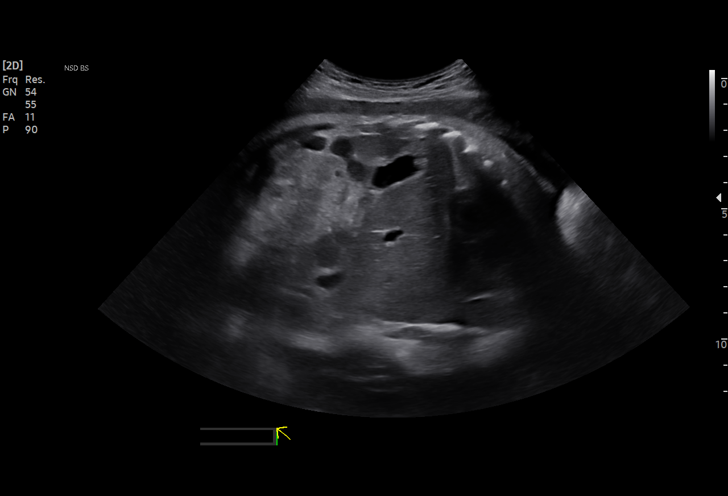
[im 19/27]
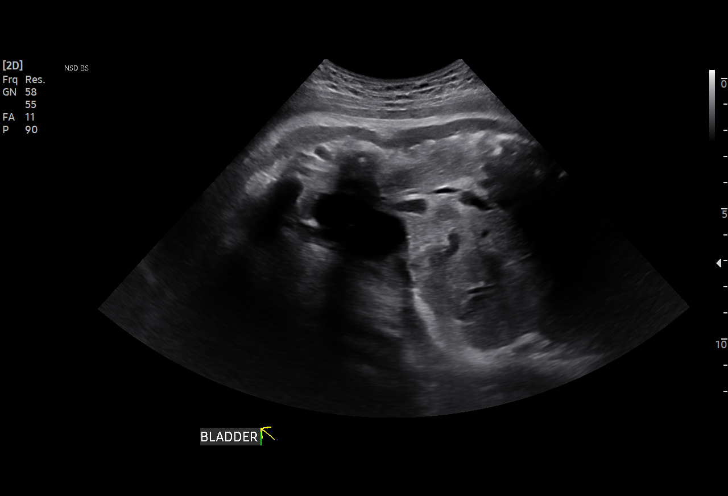
[im 21/27]
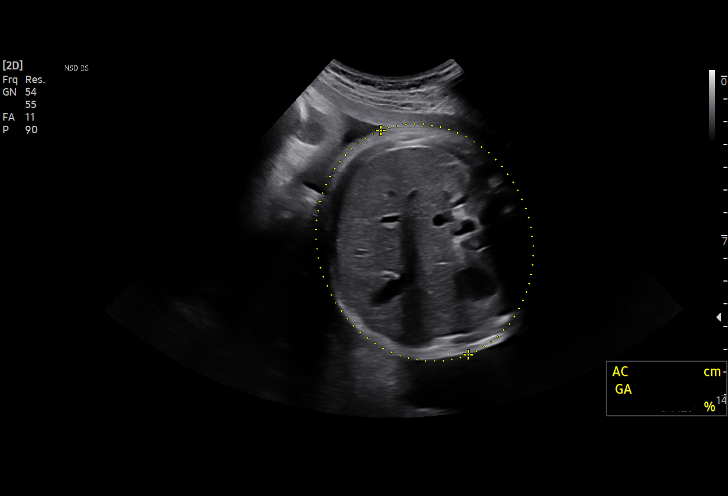
[im 23/27]
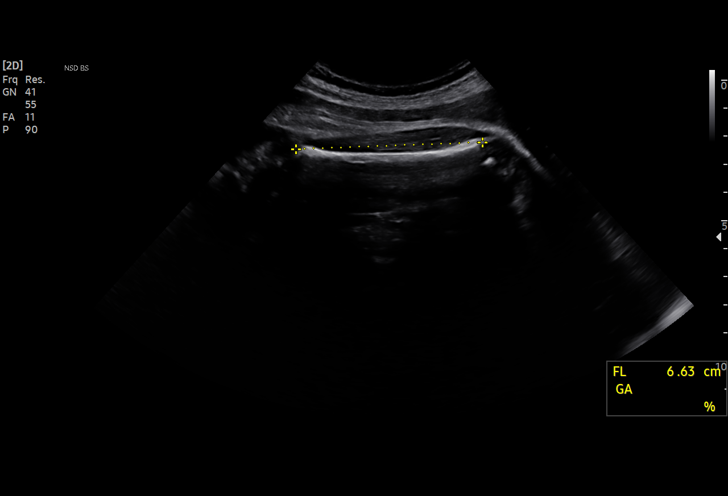
[im 25/27]
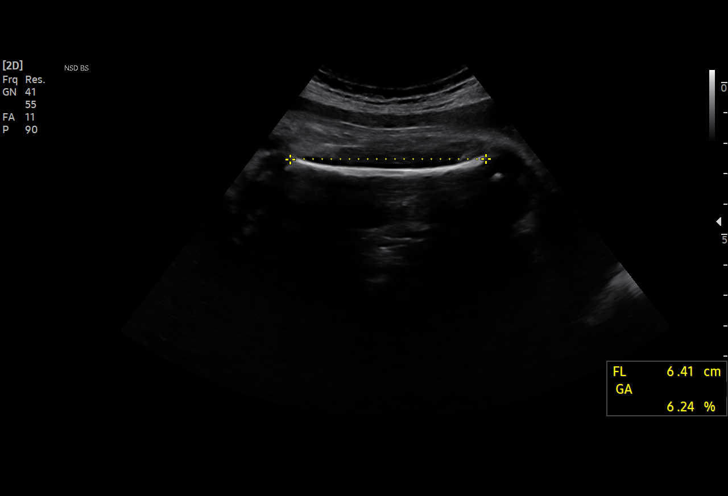
[im 27/27]
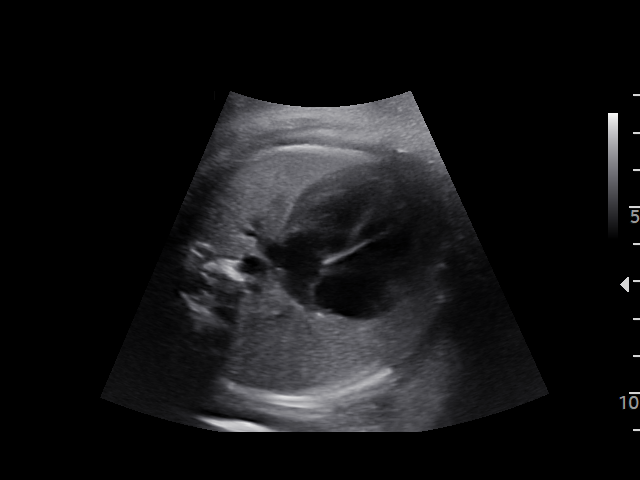

[14 of 27 positions shown; findings below may reference images not displayed]

Attending:        Yuliana Kilmer      Secondary Phy.:   [facility] MAU/Triage
                   MORAYMA

Indications

 Vaginal bleeding in pregnancy, third trimester
 35 weeks gestation of pregnancy
 Pelvic pain affecting pregnancy in third
 trimester
Fetal Evaluation

 Num Of Fetuses:         1
 Cardiac Activity:       Observed
 Placenta:               Posterior
 P. Cord Insertion:      Previously Visualized

 Amniotic Fluid
 AFI FV:      Within normal limits
OB History

 Gravidity:    2         Term:   1
 Living:       1
Gestational Age

 LMP:           35w 0d        Date:  05/04/19                 EDD:   02/08/20
 Best:          35w 0d     Det. By:  LMP  (05/04/19)          EDD:   02/08/20
Anatomy

 Cranium:               Appears normal         LVOT:                   Appears normal
 Cavum:                 Appears normal         Aortic Arch:            Appears normal
 Ventricles:            Appears normal         Ductal Arch:            Appears normal
 Choroid Plexus:        Appears normal         Diaphragm:              Appears normal
 Cerebellum:            Appears normal         Stomach:                Appears normal, left
                                                                       sided
 Posterior Fossa:       Appears normal         Abdomen:                Appears normal
 Nuchal Fold:           Appears normal         Abdominal Wall:         Appears nml (cord
                                                                       insert, abd wall)
 Face:                  Appears normal         Cord Vessels:           Appears normal (3
                        (orbits and profile)                           vessel cord)
 Lips:                  Appears normal         Kidneys:                Appear normal
 Palate:                Appears normal         Bladder:                Appears normal
 Thoracic:              Appears normal         Spine:                  Appears normal
 Heart:                 Appears normal         Upper Extremities:      Appears normal
                        (4CH, axis, and
                        situs)
 RVOT:                  Appears normal         Lower Extremities:      Appears normal

 Other:  Heels/feet visualized. Nasal bone visualized.
Impression

 Limited exam to evaluate vaginal bleeding
 Normal amniotic fluid and fetal movement observed
 NO evidence of placental abruption or previa
 Anatomy reviewed and appears normal. Biometry not
 performed.
 Prior anatomy performed in [DATE] with an EFW in the 15%.
Recommendations

 Clinical correalation recommended.

## 2022-03-25 NOTE — Telephone Encounter (Signed)
Called this am stating she needed the abilify m prior authorized. Authorization obtained this am. She is on the schedule for her first inj tomorrow but I called the pharmacy and they will not have her inj in the pharmacy till thurs. I called her with this information and she will come thurs the 21 st at 1230.

## 2022-03-26 ENCOUNTER — Ambulatory Visit (HOSPITAL_COMMUNITY): Payer: Medicaid Other

## 2022-03-27 ENCOUNTER — Ambulatory Visit (INDEPENDENT_AMBULATORY_CARE_PROVIDER_SITE_OTHER): Payer: Medicaid Other | Admitting: *Deleted

## 2022-03-27 ENCOUNTER — Encounter (HOSPITAL_COMMUNITY): Payer: Self-pay

## 2022-03-27 VITALS — BP 119/82 | HR 87 | Ht 61.0 in | Wt 176.0 lb

## 2022-03-27 DIAGNOSIS — F3181 Bipolar II disorder: Secondary | ICD-10-CM

## 2022-03-27 NOTE — Progress Notes (Signed)
In for her first Abilify M 400 mg in this clinic. She is currently doing well, states she had been on the abilify m shot in the past and preferred it to oral. Explained the clinic times and that her provider Dr Ronne Binning will be doing the shot clinic starting next month so she will be seeing her at least every three months for monitoring. She got her first shot today in her R DELTOID without issue. She is to return in 28 days. She is currently in school for cyper tech and has a partner and three children. No complaints voiced.

## 2022-04-11 ENCOUNTER — Encounter (HOSPITAL_COMMUNITY): Payer: Self-pay

## 2022-04-11 ENCOUNTER — Ambulatory Visit (INDEPENDENT_AMBULATORY_CARE_PROVIDER_SITE_OTHER): Payer: Medicaid Other | Admitting: Mental Health

## 2022-04-11 DIAGNOSIS — F3130 Bipolar disorder, current episode depressed, mild or moderate severity, unspecified: Secondary | ICD-10-CM | POA: Diagnosis not present

## 2022-04-11 DIAGNOSIS — F900 Attention-deficit hyperactivity disorder, predominantly inattentive type: Secondary | ICD-10-CM

## 2022-04-11 DIAGNOSIS — F411 Generalized anxiety disorder: Secondary | ICD-10-CM
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
## 2022-04-14 NOTE — Plan of Care (Signed)
  Problem: Bipolar Disorder CCP Problem  1 Bipolar  Goal: LTG: Stabilize mood and increase goal-directed behavior AEB self-report and no hospitalizations in the next 6 months  Outcome: Initial Goal: STG: Michaela Williams maintain stability in moods AEB identification of x 3 factors that support mood management, attendance to daily routine with ability to reframe thinking patterns as needed within the next 6 months  Outcome: Initial   Problem: Anxiety Disorder CCP Problem  1 GAD Goal: LTG: Patient will score less than 5 on the Generalized Anxiety Disorder 7 Scale (GAD-7) Outcome: Initial Goal: STG: Michaela Williams will increase management of stressors AEB development of x 3 effective coping skills (distress tolerance, grounding relaxation etc) with ability to engage in effective problem solving skills within the next 6 months.  Outcome: Initial

## 2022-04-23 ENCOUNTER — Other Ambulatory Visit (HOSPITAL_COMMUNITY): Payer: Self-pay | Admitting: Psychiatry

## 2022-04-23 ENCOUNTER — Telehealth (HOSPITAL_COMMUNITY): Payer: Self-pay | Admitting: Psychiatry

## 2022-04-23 DIAGNOSIS — F900 Attention-deficit hyperactivity disorder, predominantly inattentive type: Secondary | ICD-10-CM

## 2022-04-23 MED ORDER — AMPHETAMINE-DEXTROAMPHETAMINE 10 MG PO TABS: 10.0000 mg | ORAL_TABLET | Freq: Every day | ORAL | 0 refills | Status: DC

## 2022-04-23 NOTE — Telephone Encounter (Signed)
Medication refilled and sent to preferred pharmacy

## 2022-04-24 ENCOUNTER — Encounter (HOSPITAL_COMMUNITY): Payer: Self-pay

## 2022-04-24 ENCOUNTER — Ambulatory Visit (INDEPENDENT_AMBULATORY_CARE_PROVIDER_SITE_OTHER): Payer: Medicaid Other | Admitting: *Deleted

## 2022-04-24 VITALS — BP 115/81 | HR 93 | Ht 61.0 in | Wt 171.0 lb

## 2022-04-24 DIAGNOSIS — F3130 Bipolar disorder, current episode depressed, mild or moderate severity, unspecified: Secondary | ICD-10-CM | POA: Diagnosis not present

## 2022-04-24 DIAGNOSIS — F3181 Bipolar II disorder: Secondary | ICD-10-CM

## 2022-04-24 NOTE — Progress Notes (Signed)
In as scheduled for her injection. Abilify M 400 mg given in her L DELTOID. She did not bring her shot today as expected to, states she called and front desk staff told her to come in without it. This is not accurate as she has MCD. Asked her to bring two inj next appt, one to replace the sample I gave her today and the one to be administered at her next visit and to bring them both at her next appt on 04/21/22. She is doing well, no complaints offered. States she notices the shot is tamping down her appetite but she is OK with it and knows to eat even if she is not really hungry. Weight is stable. She is to return in 28 days for her next shot.

## 2022-05-02 ENCOUNTER — Encounter (HOSPITAL_COMMUNITY): Payer: Self-pay

## 2022-05-02 ENCOUNTER — Ambulatory Visit (HOSPITAL_COMMUNITY): Payer: Medicaid Other | Admitting: Mental Health

## 2022-05-16 ENCOUNTER — Ambulatory Visit (INDEPENDENT_AMBULATORY_CARE_PROVIDER_SITE_OTHER): Payer: Medicaid Other | Admitting: Mental Health

## 2022-05-16 DIAGNOSIS — F314 Bipolar disorder, current episode depressed, severe, without psychotic features: Secondary | ICD-10-CM | POA: Diagnosis not present

## 2022-05-16 DIAGNOSIS — F411 Generalized anxiety disorder: Secondary | ICD-10-CM

## 2022-05-16 NOTE — Progress Notes (Signed)
THERAPIST PROGRESS NOTE Virtual Visit via Video Note  I connected with Michaela Williams on 05/16/22 at 11:00 AM EST by a video enabled telemedicine application and verified that I am speaking with the correct person using two identifiers.  Location: Patient: home address on file Provider: home office   I discussed the limitations of evaluation and management by telemedicine and the availability of in person appointments. The patient expressed understanding and agreed to proceed.  I discussed the assessment and treatment plan with the patient. The patient was provided an opportunity to ask questions and all were answered. The patient agreed with the plan and demonstrated an understanding of the instructions.   The patient was advised to call back or seek an in-person evaluation if the symptoms worsen or if the condition fails to improve as anticipated.  I provided 54 minutes of non-face-to-face time during this encounter.   Michaela Williams, [facility]   Session Time: 11:05am (54 minutes)  Participation Level: Active  Behavioral Response: CasualAlertDysphoric  Type of Therapy: Individual Therapy  Treatment Goals addressed:  LTG: Stabilize mood and increase goal-directed behavior AEB self-report and no hospitalizations in the next 6 months    STG: Michaela Williams maintain stability in moods AEB identification of x 3 factors that support mood management, attendance to daily routine with ability to reframe thinking patterns as needed within the next 6 months   STG: Michaela Williams will increase management of stressors AEB development of x 3 effective coping skills (distress tolerance, grounding relaxation etc) with ability to engage in effective problem solving skills within the next 6 months.   ProgressTowards Goals: Initial  Interventions: CBT and Supportive  Summary: Michaela Williams is a 26 y.o. female who presents with diagnoses of bipolar disorder and GAD. Michaela Williams presents alert and oriented;  mood and affect adequate. Speech clear and coherent at normal rae and tone. Engaged and receptive to interventions. Thought process logical, goal-oriented. Prachi shares for moods to have been stable and shares to feel 'calmer." Shares to have started injectable medications and feels as if this has been helpful for mental health stability. Shares ongoing social anxiety and expressed difficulty in presenting to community at times as a result. Shares thoughts on feeling as if she is an Occupational psychologist and shares history of family involvement and feeling as if she is a 'weirdo'. Shares difficulty in being her true self around others outside of her family. Shares thoughts on parents not wanting her and only having her because they thought they could not have children and reports to have noticed a difference once their biological child, her sister was born. Shares working to challenge unhelpful thinking styles but notes difficulty in doing so. Shares working to forgive herself although unable to identify what she did wrong in childhood and able to process with therapist to have been working though mental health challenges in adolescence. Shares concern for when others get to know her would not like her. Shares to be working to cope with these thoughts and reframe for alternative thinking.   Suicidal/Homicidal: Nowithout intent/plan  Therapist Response: Therapist engaged [employer] in [place] session. Completed check in and assessed for confidentiality and current location for OPT session. Assessed for current level of functioning sxs management and level of stressors. Provided supportive feedback; challenged unhelpful thinking patterns and explored alternatives. Supported Michaela Williams, Michaela Williams in expressing thoughts and feelings related to being adopted and ongoing challenges she faces in connection to anxiety and concern for what others think of her. Encouraged Michaela Williams to  work on reframing thoughts as needed in [place] and exploration on  going work for [employer], [employer]. Reviewed session and provided follow up. NO safety concerns noted.   Plan: Return again in  x 2 weeks.  Diagnosis: Bipolar 1 disorder, depressed, severe ([place])  Generalized anxiety disorder  Collaboration of Care: Other None  Patient/Guardian was advised Release of Information must be obtained prior to any record release in order to collaborate their care with an outside provider. Patient/Guardian was advised if they have not already done so to contact the registration department to sign all necessary forms in order for [place] to release information regarding their care.   Consent: Patient/Guardian gives verbal consent for treatment and assignment of benefits for services provided during this visit. Patient/Guardian expressed understanding and agreed to proceed.   Rockey Situ [place], [facility] 05/16/2022

## 2022-05-19 NOTE — Plan of Care (Signed)
  Problem: Bipolar Disorder CCP Problem  1  Goal: LTG: Stabilize mood and increase goal-directed behavior AEB self-report and no hospitalizations in the next 6 months   Outcome: Initial Goal: STG: Michaela Williams maintain stability in moods AEB identification of x 3 factors that support mood management, attendance to daily routine with ability to reframe thinking patterns as needed within the next 6 months Outcome: Initial Goal: STG: Michaela Williams will increase management of stressors AEB development of x 3 effective coping skills (distress tolerance, grounding relaxation etc) with ability to engage in effective problem solving skills within the next 6 months.  Outcome: Initial

## 2022-05-22 ENCOUNTER — Other Ambulatory Visit (HOSPITAL_COMMUNITY): Payer: Self-pay | Admitting: Registered Nurse

## 2022-05-22 ENCOUNTER — Ambulatory Visit (HOSPITAL_COMMUNITY): Payer: Medicaid Other

## 2022-05-22 DIAGNOSIS — F3181 Bipolar II disorder: Secondary | ICD-10-CM

## 2022-05-22 MED ORDER — ABILIFY MAINTENA 400 MG IM PRSY: 400.0000 mg | PREFILLED_SYRINGE | INTRAMUSCULAR | 3 refills | Status: DC

## 2022-05-23 ENCOUNTER — Telehealth (HOSPITAL_COMMUNITY): Payer: Self-pay | Admitting: Psychiatry

## 2022-05-23 NOTE — Telephone Encounter (Signed)
Patient left voicemail stating that the abilify is still not ready. She mentioned something about samples.  She is also requesting that her adderall be refilled.  Pharmacy:   [place], [place] (Ph: [phone])   Patient # [phone]

## 2022-05-26 ENCOUNTER — Other Ambulatory Visit (HOSPITAL_COMMUNITY): Payer: Self-pay | Admitting: Psychiatry

## 2022-05-26 DIAGNOSIS — F900 Attention-deficit hyperactivity disorder, predominantly inattentive type: Secondary | ICD-10-CM

## 2022-05-26 DIAGNOSIS — F3181 Bipolar II disorder: Secondary | ICD-10-CM

## 2022-05-26 MED ORDER — AMPHETAMINE-DEXTROAMPHETAMINE 10 MG PO TABS: 10.0000 mg | ORAL_TABLET | Freq: Every day | ORAL | 0 refills | Status: DC

## 2022-05-26 NOTE — Telephone Encounter (Signed)
Adderall refilled and sent to preferred pharmacy. Patient also informed provider that she has picked up her Abilify injection.

## 2022-05-30 ENCOUNTER — Ambulatory Visit (INDEPENDENT_AMBULATORY_CARE_PROVIDER_SITE_OTHER): Payer: Medicaid Other | Admitting: Mental Health

## 2022-05-30 ENCOUNTER — Ambulatory Visit (HOSPITAL_COMMUNITY): Payer: Medicaid Other | Admitting: Mental Health

## 2022-05-30 DIAGNOSIS — F411 Generalized anxiety disorder: Secondary | ICD-10-CM

## 2022-05-30 DIAGNOSIS — F3130 Bipolar disorder, current episode depressed, mild or moderate severity, unspecified: Secondary | ICD-10-CM

## 2022-05-30 NOTE — Plan of Care (Signed)
  Problem: Bipolar Disorder CCP Problem  1  Goal: LTG: Stabilize mood and increase goal-directed behavior AEB self-report and no hospitalizations in the next 6 months   Outcome: Progressing Goal: STG: Michaela Williams maintain stability in moods AEB identification of x 3 factors that support mood management, attendance to daily routine with ability to reframe thinking patterns as needed within the next 6 months Outcome: Progressing Goal: STG: Michaela Williams will increase management of stressors AEB development of x 3 effective coping skills (distress tolerance, grounding relaxation etc) with ability to engage in effective problem solving skills within the next 6 months.  Outcome: Progressing

## 2022-05-30 NOTE — Progress Notes (Signed)
THERAPIST PROGRESS NOTE Virtual Visit via Video Note  I connected with [name] on 05/30/22 at  9:00 AM EST by a video enabled telemedicine application and verified that I am speaking with the correct person using two identifiers.  Location: Patient: home address on file Provider: home office   I discussed the limitations of evaluation and management by telemedicine and the availability of in person appointments. The patient expressed understanding and agreed to proceed.   I discussed the assessment and treatment plan with the patient. The patient was provided an opportunity to ask questions and all were answered. The patient agreed with the plan and demonstrated an understanding of the instructions.   The patient was advised to call back or seek an in-person evaluation if the symptoms worsen or if the condition fails to improve as anticipated.  I provided 53 minutes of non-face-to-face time during this encounter.   Marion Downer, [facility]   Session Time: 9:02 am (53)  Participation Level: Active  Behavioral Response: CasualAlertAdequate  Type of Therapy: Individual Therapy  Treatment Goals addressed: STG: Ashauna maintain stability in moods AEB identification of x 3 factors that support mood management, attendance to daily routine with ability to reframe thinking patterns as needed within the next 6 months    STG: Bernardine will increase management of stressors AEB development of x 3 effective coping skills (distress tolerance, grounding relaxation etc) with ability to engage in effective problem solving skills within the next 6 months.   ProgressTowards Goals: Progressing  Interventions: CBT and Supportive  Summary: KAELAN EMAMI is a 26 y.o. female who presents with diagnoses of bipolar disorder and GAD. Shakeitha presents lethargic but oriented; mood and affect adequate. Speech clear and coherent at normal rate and tone. Engaged and receptive to interventions. Shares  for moods to have been stable and denies extreme high or lows. Shares concerning holiday and ability to engage with fiance's family well although reduced engagement due to feelings of pain. Shares difficulty with sleep and shares for daughter to wake in middle of night. Agrees to follow up with pediatrician and work to extinguish this behavior with ability to sleep through the night. Shares typical routine with therapist and exploration of possible adjustments and working to communicate needs to partner. Shares concerning feelings of anxiety and reports distorted thinking\beliefs of others. " I don't like them." "Inconvenient to others." "Burden." "Annoyance."Able to process with therapist presence of projection of thoughts of self to others and lacking general confidence. Shares confidence in school setting and feels respected by peers and is able to engage with them well with little anxiety but continues to hold high anxiety in general social settings of thoughts of them not liking her. Able to process with therapist presence of distorted thinking and working to increase feelings of confidence and self-worth and working to not generalize past experiences. Shares desire to increase self-care and working to see self as worthy. Agrees to ask for support with fiance to take a nap. Denies safety concerns.  Reports stability in moods, identification of adequate sleep for needed changes in daily routine to support stability.  Working to navigate stressors and working to identify distorted thinking contributing to stressors  Suicidal/Homicidal: Nowithout intent/plan  Therapist Response: Transport planner engaged [employer] in [place]. Completed check in and assessed for confidentiality and current location for OPT session. Assessed for current level of functioning sxs management and level of stressors. Provided supportive feedback; validated feelings. Engaged Shannen in exploring engagement with family  and  navigating through holiday. Explored abiity to engage in social settings and supported in identify distorted thoughts and beliefs. Open ended questions exploring different "hats" in which she wears in variety of settings and altering thoughts of self in school setting vs. General social settings. Processed projecting her negative thoughts of self onto others and working to increase feelings of self-worth and confidence. Explored changes in routine and sleep schedule to support in increase level of functioning. Encouraged ability to identify and communicate needs. Reviewed session and provided follow up.   Plan: Return again in x 2 weeks.  Diagnosis: Bipolar I disorder, most recent episode depressed ([place])  Generalized anxiety disorder  Collaboration of Care: Other None  Patient/Guardian was advised Release of Information must be obtained prior to any record release in order to collaborate their care with an outside provider. Patient/Guardian was advised if they have not already done so to contact the registration department to sign all necessary forms in order for [place] to release information regarding their care.   Consent: Patient/Guardian gives verbal consent for treatment and assignment of benefits for services provided during this visit. Patient/Guardian expressed understanding and agreed to proceed.   Rockey Situ [place], [facility] 05/30/2022

## 2022-06-03 ENCOUNTER — Emergency Department (HOSPITAL_COMMUNITY): Payer: Medicaid Other

## 2022-06-03 ENCOUNTER — Emergency Department (HOSPITAL_COMMUNITY)
Admission: EM | Admit: 2022-06-03 | Discharge: 2022-06-03 | Discharge status: Against Medical Advice | Payer: Medicaid Other | Attending: Emergency Medicine | Admitting: Emergency Medicine

## 2022-06-03 ENCOUNTER — Other Ambulatory Visit: Payer: Self-pay

## 2022-06-03 ENCOUNTER — Ambulatory Visit: Payer: Medicaid Other

## 2022-06-03 DIAGNOSIS — R079 Chest pain, unspecified: Secondary | ICD-10-CM | POA: Diagnosis not present

## 2022-06-03 DIAGNOSIS — Z5321 Procedure and treatment not carried out due to patient leaving prior to being seen by health care provider: Secondary | ICD-10-CM | POA: Insufficient documentation

## 2022-06-03 LAB — CBC
HCT: 41.8 % (ref 36.0–46.0)
Hemoglobin: 13.5 g/dL (ref 12.0–15.0)
MCH: 29.6 pg (ref 26.0–34.0)
MCHC: 32.3 g/dL (ref 30.0–36.0)
MCV: 91.7 fL (ref 80.0–100.0)
Platelets: 301 10*3/uL (ref 150–400)
RBC: 4.56 MIL/uL (ref 3.87–5.11)
RDW: 13.3 % (ref 11.5–15.5)
WBC: 8.6 10*3/uL (ref 4.0–10.5)
nRBC: 0 % (ref 0.0–0.2)

## 2022-06-03 LAB — I-STAT BETA HCG BLOOD, ED (MC, WL, AP ONLY): I-stat hCG, quantitative: <5 m[IU]/mL (ref <5)

## 2022-06-03 LAB — BASIC METABOLIC PANEL
Anion gap: 8 (ref 5–15)
BUN: 12 mg/dL (ref 6–20)
CO2: 24 mmol/L (ref 22–32)
Calcium: 9.1 mg/dL (ref 8.9–10.3)
Chloride: 105 mmol/L (ref 98–111)
Creatinine, Ser: 0.64 mg/dL (ref 0.44–1.00)
GFR, Estimated: >60 mL/min (ref >60)
Glucose, Bld: 108 mg/dL — ABNORMAL HIGH (ref 70–99)
Potassium: 3.6 mmol/L (ref 3.5–5.1)
Sodium: 137 mmol/L (ref 135–145)

## 2022-06-03 LAB — TROPONIN I (HIGH SENSITIVITY): Troponin I (High Sensitivity): <2 ng/L (ref <18)

## 2022-06-03 NOTE — ED Triage Notes (Signed)
Pt reports chest pain x 1 week pt [place]. Also reports increased caffeine intake "I don't know if the caffeine and adderall I take are making my pain worse."

## 2022-06-04 ENCOUNTER — Other Ambulatory Visit: Payer: Self-pay

## 2022-06-04 ENCOUNTER — Emergency Department (HOSPITAL_COMMUNITY)
Admission: EM | Admit: 2022-06-04 | Discharge: 2022-06-05 | Disposition: A | Discharge status: Home / Self Care | Payer: Medicaid Other | Attending: Emergency Medicine | Admitting: Emergency Medicine

## 2022-06-04 DIAGNOSIS — D72829 Elevated white blood cell count, unspecified: Secondary | ICD-10-CM | POA: Insufficient documentation

## 2022-06-04 DIAGNOSIS — R079 Chest pain, unspecified: Secondary | ICD-10-CM | POA: Diagnosis present

## 2022-06-04 DIAGNOSIS — R0789 Other chest pain: Secondary | ICD-10-CM | POA: Diagnosis not present

## 2022-06-04 LAB — CBC
HCT: 38.5 % (ref 36.0–46.0)
Hemoglobin: 12.7 g/dL (ref 12.0–15.0)
MCH: 30.3 pg (ref 26.0–34.0)
MCHC: 33 g/dL (ref 30.0–36.0)
MCV: 91.9 fL (ref 80.0–100.0)
Platelets: 286 10*3/uL (ref 150–400)
RBC: 4.19 MIL/uL (ref 3.87–5.11)
RDW: 13.2 % (ref 11.5–15.5)
WBC: 11.5 10*3/uL — ABNORMAL HIGH (ref 4.0–10.5)
nRBC: 0 % (ref 0.0–0.2)

## 2022-06-04 LAB — BASIC METABOLIC PANEL
Anion gap: 7 (ref 5–15)
BUN: 13 mg/dL (ref 6–20)
CO2: 24 mmol/L (ref 22–32)
Calcium: 9 mg/dL (ref 8.9–10.3)
Chloride: 108 mmol/L (ref 98–111)
Creatinine, Ser: 0.61 mg/dL (ref 0.44–1.00)
GFR, Estimated: >60 mL/min (ref >60)
Glucose, Bld: 116 mg/dL — ABNORMAL HIGH (ref 70–99)
Potassium: 4 mmol/L (ref 3.5–5.1)
Sodium: 139 mmol/L (ref 135–145)

## 2022-06-04 LAB — I-STAT BETA HCG BLOOD, ED (MC, WL, AP ONLY): I-stat hCG, quantitative: <5 m[IU]/mL (ref <5)

## 2022-06-04 LAB — TROPONIN I (HIGH SENSITIVITY): Troponin I (High Sensitivity): <2 ng/L (ref <18)

## 2022-06-04 LAB — D-DIMER, QUANTITATIVE: D-Dimer, Quant: <0.27 ug/mL-FEU (ref 0.00–0.50)

## 2022-06-04 MED ORDER — ACETAMINOPHEN 500 MG PO TABS
1000.0000 mg | ORAL_TABLET | ORAL | Status: AC
  Administered 2022-06-04: 1000 mg via ORAL
  Filled 2022-06-04: qty 2

## 2022-06-04 NOTE — ED Provider Triage Note (Signed)
Emergency Medicine Provider Triage Evaluation Note  UNIQUE SILLAS , a 26 y.o. female  was evaluated in triage.  Pt complains of continued CP since thanksgiving seems she's had consistent CP.  Does feel somewhat SOB.  No NV.     Review of Systems  Positive: CP, SOB Negative: Hemoptysis  Physical Exam  BP (!) 121/91 (BP Location: Right Arm)   Pulse (!) 102   Temp 98.5 F (36.9 C)   Resp 18   LMP 06/01/2022   SpO2 100%  Gen:   Awake, no distress   Resp:  Normal effort  MSK:   Moves extremities without difficulty  Other:  Reproducible TTP of Chest wall  Medical Decision Making  Medically screening exam initiated at 10:03 PM.  Appropriate orders placed.  LAMESHIA HYPOLITE was informed that the remainder of the evaluation will be completed by another provider, this initial triage assessment does not replace that evaluation, and the importance of remaining in the ED until their evaluation is complete.  Dimer added. Creat nml from yesterday.  Sinus tachy EKG.  Overall non-toxic appearing.     Pati Gallo [place], [place] 06/04/22 2208

## 2022-06-04 NOTE — ED Triage Notes (Addendum)
Chest pain since day before thanksgiving. Has to sleep with HOB elevated bc "fluid forms in back of the throat". She has been "taking adderal and drinking energy drinks. Was seen at [facility] yesterday and states she was told by Brock Bad, a cardiologist, there that she had heart attack'. PT has significant psych hx. No cards note or note pt seen in ED other than triage note.

## 2022-06-05 ENCOUNTER — Ambulatory Visit (HOSPITAL_COMMUNITY): Payer: Medicaid Other | Admitting: Mental Health

## 2022-06-05 NOTE — Discharge Instructions (Signed)
Suspect your chest pain is more muscular, I recommend taking NSAIDs which include naproxen, ibuprofen, Aleve, may take them as prescribed, you may also alternate with Tylenol.  I would recommend that you follow-up with a primary care provider I have given you the contact information for community health and wellness please call to schedule a follow-up appointment  Come back to the emergency department if you develop chest pain, shortness of breath, severe abdominal pain, uncontrolled nausea, vomiting, diarrhea.

## 2022-06-05 NOTE — ED Provider Notes (Signed)
[facility] EMERGENCY DEPARTMENT Provider Note   CSN: [id number] Arrival date & time: 06/04/22  2133     History  Chief Complaint  Patient presents with   Chest Pain    Michaela Williams is a 25 y.o. female.  HPI   Patient with medical history including anemia, anxiety, depression, seizures presents with complaints of chest pain, been going on since Thanksgiving, she states has been the left side of her chest, it is intermittent, she will occasionally feel short of breath, shortness of breath is not constant, pain is not worsened with exertion, comes on randomly, last approximate 5 hours and resolve on its own, no history of PEs or DVTs currently not on hormone therapy, she has no cardiac history, she does note that her mom side had some heart problems she is unsure of what, no recent surgeries no long immobilization, she has no other complaints.  She does state that she was seen at [facility] and was discharged.  I reviewed patient's chart appears that she had lab work performed yesterday, they are all unremarkable.  Home Medications Prior to Admission medications   Medication Sig Start Date End Date Taking? Authorizing Provider  amphetamine-dextroamphetamine (ADDERALL) 10 MG tablet Take 1 tablet (10 mg total) by mouth daily. 05/26/22   Salley Slaughter, NP  ARIPiprazole (ABILIFY) 10 MG tablet Take 1 tablet (10 mg total) by mouth daily. 03/20/22 03/20/23  Salley Slaughter, NP  ARIPiprazole ER (ABILIFY MAINTENA) 400 MG PRSY prefilled syringe Inject 400 mg into the muscle every 28 (twenty-eight) days. 05/22/22   Rankin, Shuvon B, NP  FLUoxetine (PROZAC) 10 MG capsule Take 1 capsule (10 mg total) by mouth daily. 03/20/22   Salley Slaughter, NP      Allergies    Quetiapine fumarate, Flexeril [cyclobenzaprine], Lithium, and Lexapro [escitalopram]    Review of Systems   Review of Systems  Constitutional:  Negative for chills and fever.  Respiratory:  Positive  for shortness of breath.   Cardiovascular:  Positive for chest pain.  Gastrointestinal:  Negative for abdominal pain.  Neurological:  Negative for headaches.    Physical Exam Updated Vital Signs BP (!) 121/91 (BP Location: Right Arm)   Pulse (!) 102   Temp 98.5 F (36.9 C)   Resp 18   LMP 06/01/2022   SpO2 100%  Physical Exam Vitals and nursing note reviewed.  Constitutional:      General: She is not in acute distress.    Appearance: She is not ill-appearing.  HENT:     Head: Normocephalic and atraumatic.     Nose: No congestion.  Eyes:     Conjunctiva/sclera: Conjunctivae normal.  Cardiovascular:     Rate and Rhythm: Normal rate and regular rhythm.     Pulses: Normal pulses.     Heart sounds: No murmur heard.    No friction rub. No gallop.  Pulmonary:     Effort: No respiratory distress.     Breath sounds: No wheezing, rhonchi or rales.     Comments: Chest pain was focalized reproducible, on the anterior aspect of the left side of her chest, no crepitus or deformities noted, no respiratory distress speaking full sentences, lung sounds are clear bilaterally. Musculoskeletal:     Right lower leg: No edema.     Left lower leg: No edema.     Comments: No unilateral leg swelling no calf tenderness no palpable cords  Skin:    General: Skin is warm and  dry.  Neurological:     Mental Status: She is alert.  Psychiatric:        Mood and Affect: Mood normal.     ED Results / Procedures / Treatments   Labs (all labs ordered are listed, but only abnormal results are displayed) Labs Reviewed  BASIC METABOLIC PANEL - Abnormal; Notable for the following components:      Result Value   Glucose, Bld 116 (*)    All other components within normal limits  CBC - Abnormal; Notable for the following components:   WBC 11.5 (*)    All other components within normal limits  D-DIMER, QUANTITATIVE  I-STAT BETA HCG BLOOD, ED ([facility], [facility], [facility] ONLY)  TROPONIN I (HIGH SENSITIVITY)  TROPONIN I  (HIGH SENSITIVITY)    EKG None  Radiology DG Chest 2 View  Result Date: 06/03/2022 CLINICAL DATA:  Chest pain EXAM: CHEST - 2 VIEW COMPARISON:  06/02/2017 FINDINGS: The heart size and mediastinal contours are within normal limits. Both lungs are clear. The visualized skeletal structures are unremarkable. IMPRESSION: No active cardiopulmonary disease. Electronically Signed   By: Jerilynn Mages.  Shick M.D.   On: 06/03/2022 10:42    Procedures Procedures    Medications Ordered in ED Medications  acetaminophen (TYLENOL) tablet 1,000 mg (1,000 mg Oral Given 06/04/22 2237)    ED Course/ Medical Decision Making/ A&P                           Medical Decision Making  This patient presents to the ED for concern of chest pain, this involves an extensive number of treatment options, and is a complaint that carries with it a high risk of complications and morbidity.  The differential diagnosis includes PE, ACS, pneumonia    Additional history obtained:  Additional history obtained from partner at bedside External records from outside source obtained and reviewed including recent ER notes   Co morbidities that complicate the patient evaluation  N/A  Social Determinants of Health:  No primary care provider    Lab Tests:  I Ordered, and personally interpreted labs.  The pertinent results include: CBC shows leukocytosis 11.5, BMP shows glucose of 116, negative D-dimer, for troponin negative, i-STAT hCG negative   Imaging Studies ordered:  I ordered imaging studies including N/A I independently visualized and interpreted imaging which showed N/A I agree with the radiologist interpretation   Cardiac Monitoring:  The patient was maintained on a cardiac monitor.  I personally viewed and interpreted the cardiac monitored which showed an underlying rhythm of: Without signs of ischemia   Medicines ordered and prescription drug management:  I ordered medication including N/A I have  reviewed the patients home medicines and have made adjustments as needed  Critical Interventions:  N/A   Reevaluation:  Triage obtain basic lab work which I personally reviewed they are unremarkable, she had a benign physical exam, agreement with discharge at this time.  Consultations Obtained:  N/A  Test Considered:  DG of chest-will be deferred as patient had a chest x-ray performed 2 days ago, was unremarkable she has no new symptoms states symptoms have remained unchanged.    Rule out I have low suspicion for ACS as history is atypical, patient has no cardiac history, EKG was sinus rhythm without signs of ischemia, patient had a negative troponin.  Low suspicion for PE d dimer is negative.  Low suspicion for AAA or aortic dissection as history is atypical, patient has low  risk factors.  Low suspicion for systemic infection as patient is nontoxic-appearing, vital signs reassuring, no obvious source infection noted on exam.     Dispostion and problem list  After consideration of the diagnostic results and the patients response to treatment, I feel that the patent would benefit from discharge.  Chest pain-suspect muscular/costochondritis, will recommend NSAIDs, follow-up with PCP for further evaluation and strict return precautions.            Final Clinical Impression(s) / ED Diagnoses Final diagnoses:  Atypical chest pain    Rx / DC Orders ED Discharge Orders     None         Marcello Fennel, PA-C 06/05/22 0044    Maudie Flakes, MD 06/05/22 [phone]

## 2022-06-10 ENCOUNTER — Telehealth (INDEPENDENT_AMBULATORY_CARE_PROVIDER_SITE_OTHER): Payer: Medicaid Other | Admitting: Psychiatry

## 2022-06-10 ENCOUNTER — Encounter (HOSPITAL_COMMUNITY): Payer: Self-pay | Admitting: Psychiatry

## 2022-06-10 DIAGNOSIS — F411 Generalized anxiety disorder: Secondary | ICD-10-CM

## 2022-06-10 DIAGNOSIS — F3181 Bipolar II disorder: Secondary | ICD-10-CM

## 2022-06-10 MED ORDER — HYDROXYZINE HCL 10 MG PO TABS: 10.0000 mg | ORAL_TABLET | Freq: Three times a day (TID) | ORAL | 3 refills | Status: DC | PRN

## 2022-06-10 MED ORDER — ABILIFY MAINTENA 400 MG IM PRSY: 400.0000 mg | PREFILLED_SYRINGE | INTRAMUSCULAR | 3 refills | Status: DC

## 2022-06-10 MED ORDER — FLUOXETINE HCL 10 MG PO CAPS: 10.0000 mg | ORAL_CAPSULE | Freq: Every day | ORAL | 3 refills | Status: DC

## 2022-06-10 NOTE — Progress Notes (Signed)
BH MD/PA/NP OP Progress Note    Virtual Visit via Video Note  I connected with Michaela Williams on 06/10/22 at  1:00 PM EST by a video enabled telemedicine application and verified that I am speaking with the correct person using two identifiers.  Location: Patient: Home Provider: Clinic   I discussed the limitations of evaluation and management by telemedicine and the availability of in person appointments. The patient expressed understanding and agreed to proceed.  I provided 30 minutes of non-face-to-face time during this encounter.      06/10/2022 1:29 PM Michaela Williams  MRN:  [id number]  Chief Complaint: "I miss my Abilify injection and have been taking oral pills"  HPI: 26 year old female seen today for follow up psychiatric evaluation.  She has a psychiatric history of bipolar 1 disorder, bipolar 2 disorder, adjustment disorder, anxiety, and depression.  She is currently managed on  Adderall 10 mg daily, Abilify 400 mg monthly, and Prozac 10 mg.  She notes that her medications are somewhat effective for managing her psychiatric conditions.    Today she was well-groomed, pleasant, cooperative, and engaged in conversation.  She informed Probation officer that she missed her November Abilify injection and has been taking oral Abilify 5 mg.  Patient notes that she has been irritable, distractible, and has fluctuations in mood.  She denies other symptoms of mania.  She did Artist however that her depression has improved since her last visit.  Provider conducted a PHQ-9 and patient scored a 9, at her last visit she scored 24.  Provider also conducted a GAD-7 and patient scored a 19, at her last visit she scored a 17.  She notes that she worries about finances, school, her children, and her relationship with her fianc/family.  She endorses adequate sleep and appetite.  Today she denies SI/HI/AVH or paranoia.   Patient was recently seen in [place] on 06/04/2022 for atypical chest pain.  No  abnormal findings noted.  Per chart review chest pain-suspect muscular/costochondritis.  Patient was instructed to take NSAIDs and follow-up with her PCP.  Patient notes that she continues to have mild chest pain which she quantifies as 5 out of 10.  She notes that Tylenol is effective in managing this pain.  Provider informed patient that she can increase her oral Abilify 5 mg to 10 mg over the next week and then received her Abilify injection on 06/17/2022.  She will then discontinue oral Abilify and continue to receive monthly Abilify injections thereafter.  Patient agreeable to starting hydroxyzine 10 mg 3 times daily to help manage symptoms of anxiety.  She will continue all other medications as prescribed.  Adderall not filled today at once it was recently refilled on 05/26/2022.  Potential side effects of medication and risks vs benefits of treatment vs non-treatment were explained and discussed. All questions were answered.  No other concerns at this time.     Visit Diagnosis:    ICD-10-CM   1. Bipolar 2 disorder, major depressive episode (HCC)  F31.81 ARIPiprazole ER (ABILIFY MAINTENA) 400 MG PRSY prefilled syringe    FLUoxetine (PROZAC) 10 MG capsule    2. Generalized anxiety disorder  F41.1 hydrOXYzine (ATARAX) 10 MG tablet    FLUoxetine (PROZAC) 10 MG capsule      Past Psychiatric History: bipolar 1 disorder, bipolar 2 disorder, adjustment disorder, anxiety, and depression.    Past Medical History:  Past Medical History:  Diagnosis Date   Anemia    Anxiety    Asthma  in childhood    breast ca dx'd 05/2017   left   Breast cancer ([place]) 05/2017   dx at age 69   Depression    no meds in 3 yrs   Family history of breast cancer    Headache    migraines   Infection    UTI   Seizures ([place]) 2014   "seizure like activity", reaction to medicaiton, none since   UGI bleed 06/02/2017   Unwanted fertility 08/22/2020   BTL papers signed 08/20/20 Bilateral salpingectomy 09/2020     Past Surgical History:  Procedure Laterality Date   ESOPHAGOGASTRODUODENOSCOPY (EGD) WITH PROPOFOL N/A 06/03/2017   Procedure: ESOPHAGOGASTRODUODENOSCOPY (EGD) WITH PROPOFOL;  Surgeon: Ladene Artist, MD;  Location: [facility];  Service: Endoscopy;  Laterality: N/A;   IUD REMOVAL N/A 09/26/2020   Procedure: INTRAUTERINE DEVICE (IUD) REMOVAL;  Surgeon: Chancy Milroy, MD;  Location: [place];  Service: Gynecology;  Laterality: N/A;   LAPAROSCOPIC BILATERAL SALPINGECTOMY Bilateral 09/26/2020   Procedure: LAPAROSCOPIC BILATERAL SALPINGECTOMY;  Surgeon: Chancy Milroy, MD;  Location: [place];  Service: Gynecology;  Laterality: Bilateral;    Family Psychiatric History: Patient was adopted but noted that she was told that her biological mother may have had bipolar disorder or schizophrenia  Family History:  Family History  Adopted: Yes  Problem Relation Age of Onset   Mental illness Mother    Breast cancer Mother    Hypertension Maternal Grandmother    Diabetes Maternal Grandmother    Breast cancer Maternal Grandmother        d. in her 58s, "dealing with breast cancer all her life"   Breast cancer Maternal Aunt    Cervical cancer Maternal Aunt    Breast cancer Sister    Breast cancer Maternal Aunt    Breast cancer Maternal Aunt    Breast cancer Maternal Aunt    Breast cancer Maternal Aunt    Breast cancer Half-Sister        dx in her late 26s   Breast cancer Half-Sister        dx in her late 4s   Cervical cancer Half-Sister    Breast cancer Half-Sister        dx in her late 51s   Leukemia Nephew 4       currently in his teens    Social History:  Social History   Socioeconomic History   Marital status: Single    Spouse name: Not on file   Number of children: 1   Years of education: Not on file   Highest education level: Not on file  Occupational History   Occupation: unemployed  Tobacco Use   Smoking status: Former    Types:  Cigarettes    Quit date: 12/19/2014    Years since quitting: 7.4   Smokeless tobacco: Never   Tobacco comments:    age 75  Vaping Use   Vaping Use: Former  Substance and Sexual Activity   Alcohol use: Not Currently    Comment: occasionally   Drug use: No   Sexual activity: Not Currently    Birth control/protection: None  Other Topics Concern   Not on file  Social History Narrative   Not on file   Social Determinants of Health   Financial Resource Strain: Medium Risk (04/11/2022)   Overall Financial Resource Strain (CARDIA)    Difficulty of Paying Living Expenses: Somewhat hard  Food Insecurity: Food Insecurity Present (04/11/2022)   Hunger Vital  Sign    Worried About Charity fundraiser in the Last Year: Sometimes true    Ran Out of Food in the Last Year: Sometimes true  Transportation Needs: No Transportation Needs (04/11/2022)   PRAPARE - Hydrologist (Medical): No    Lack of Transportation (Non-Medical): No  Physical Activity: Inactive (04/11/2022)   Exercise Vital Sign    Days of Exercise per Week: 0 days    Minutes of Exercise per Session: 0 min  Stress: Stress Concern Present (04/11/2022)   [place]    Feeling of Stress : Very much  Social Connections: Moderately Integrated (04/11/2022)   Social Connection and Isolation Panel [NHANES]    Frequency of Communication with Friends and Family: Once a week    Frequency of Social Gatherings with Friends and Family: Never    Attends Religious Services: More than 4 times per year    Active Member of [employer] or Organizations: No    Attends Archivist Meetings: 1 to 4 times per year    Marital Status: Living with partner    Allergies:  Allergies  Allergen Reactions   Quetiapine Fumarate     Other reaction(s): Other Black outs/oversedation   Flexeril [Cyclobenzaprine]     Patient had a near syncopal event following  Flexeril PO in MAU 11/21/2019   Lithium Other (See Comments)    SEIZURES, Childhood reaction   Lexapro [Escitalopram] Other (See Comments)    Didn't like how it made her feel    Metabolic Disorder Labs: Lab Results  Component Value Date   HGBA1C 5.0 07/03/2017   MPG 96.8 07/03/2017   MPG 96.8 07/01/2017   Lab Results  Component Value Date   PROLACTIN 184.0 (H) 07/01/2017   Lab Results  Component Value Date   CHOL 196 07/03/2017   TRIG 45 07/03/2017   HDL 63 07/03/2017   CHOLHDL 3.1 07/03/2017   VLDL 9 07/03/2017   LDLCALC 124 (H) 07/03/2017   Lab Results  Component Value Date   TSH 1.650 12/07/2018   TSH 1.350 01/06/2018    Therapeutic Level Labs: No results found for: "LITHIUM" No results found for: "VALPROATE" No results found for: "CBMZ"  Current Medications: Current Outpatient Medications  Medication Sig Dispense Refill   hydrOXYzine (ATARAX) 10 MG tablet Take 1 tablet (10 mg total) by mouth 3 (three) times daily as needed. 90 tablet 3   amphetamine-dextroamphetamine (ADDERALL) 10 MG tablet Take 1 tablet (10 mg total) by mouth daily. 30 tablet 0   ARIPiprazole ER (ABILIFY MAINTENA) 400 MG PRSY prefilled syringe Inject 400 mg into the muscle every 28 (twenty-eight) days. 1 each 3   FLUoxetine (PROZAC) 10 MG capsule Take 1 capsule (10 mg total) by mouth daily. 30 capsule 3   Current Facility-Administered Medications  Medication Dose Route Frequency Provider Last Rate Last Admin   ARIPiprazole ER (ABILIFY MAINTENA) 400 MG prefilled syringe 400 mg  400 mg Intramuscular Q28 days Eulis Canner E, NP   400 mg at 04/24/22 1332     Musculoskeletal: Strength & Muscle Tone: within normal limits and telehealth visit Gait & Station: normal, telehealth visit Patient leans: N/A  Psychiatric Specialty Exam: Review of Systems  Last menstrual period 06/01/2022, currently breastfeeding.There is no height or weight on file to calculate BMI.  General Appearance: Well  Groomed  Eye Contact:  Good  Speech:  Clear and Coherent and Normal Rate  Volume:  Normal  Mood:  Anxious  Affect:  Congruent and Non-Congruent  Thought Process:  Coherent, Goal Directed and Linear  Orientation:  Full (Time, Place, and Person)  Thought Content: WDL and Logical   Suicidal Thoughts:  No  Homicidal Thoughts:  No  Memory:  Immediate;   Good Recent;   Good Remote;   Good  Judgement:  Good  Insight:  Good  Psychomotor Activity:  Normal  Concentration:  Concentration: Good and Attention Span: Good  Recall:  Good  Fund of Knowledge: Good  Language: Good  Akathisia:  No  Handed:  Right  AIMS (if indicated):Not done  Assets:  Communication Skills Desire for Improvement Financial Resources/Insurance Housing Intimacy Social Support  ADL's:  Intact  Cognition: WNL  Sleep:  Good   Screenings: AIMS    Flowsheet Row Admission (Discharged) from OP Visit from 07/01/2017 in [facility]  AIMS Total Score 0      AUDIT    Flowsheet Row Admission (Discharged) from OP Visit from 07/01/2017 in [facility]  Alcohol Use Disorder Identification Test Final Score (AUDIT) 1      GAD-7    Flowsheet Row Video Visit from 06/10/2022 in [facility] Counselor from 04/11/2022 in [facility] Video Visit from 03/20/2022 in [facility] Video Visit from 08/30/2021 in [facility] Video Visit from 06/14/2021 in [facility]  Total GAD-7 Score '19 19 17 10 8      '$ PHQ2-9    Flowsheet Row Video Visit from 06/10/2022 in [facility] Counselor from 04/11/2022 in [facility] Video Visit from 03/20/2022 in [facility] Video Visit from 08/30/2021 in [facility] Video Visit from  06/14/2021 in [place]  PHQ-2 Total Score 0 '3 5 2 '$ 0  PHQ-9 Total Score '9 15 24 9 8      '$ Flowsheet Row ED from 06/04/2022 in [facility] from 06/03/2022 in [facility] Counselor from 04/11/2022 in [facility] No Risk No Risk Low Risk        Assessment and Plan: Patient notes that she missed her last injection with shot clinic and has no having irritability, distractibility, and fluctuations in mood.  She also informed Probation officer that she has been more anxious. Provider informed patient that she can increase her oral Abilify 5 mg to 10 mg over the next week and then received her Abilify injection on 06/17/2022.  She will then discontinue oral Abilify and continue to receive monthly Abilify injections thereafter.  Patient agreeable to starting hydroxyzine 10 mg 3 times daily to help manage symptoms of anxiety.  She will continue all other medications as prescribed.  Adderall not filled today at once it was recently refilled on 05/26/2022.  1. Bipolar 2 disorder, major depressive episode (HCC)  Continue- ARIPiprazole ER (ABILIFY MAINTENA) 400 MG PRSY prefilled syringe; Inject 400 mg into the muscle every 28 (twenty-eight) days.  Dispense: 1 each; Refill: 3 Continue- FLUoxetine (PROZAC) 10 MG capsule; Take 1 capsule (10 mg total) by mouth daily.  Dispense: 30 capsule; Refill: 3  2. Generalized anxiety disorder  Start- hydrOXYzine (ATARAX) 10 MG tablet; Take 1 tablet (10 mg total) by mouth 3 (three) times daily as needed.  Dispense: 90 tablet; Refill: 3 Continue- FLUoxetine (  PROZAC) 10 MG capsule; Take 1 capsule (10 mg total) by mouth daily.  Dispense: 30 capsule; Refill: 3       Follow-up in 3 months Follow-up with therapy Salley Slaughter, NP 06/10/2022, 1:29 PM

## 2022-06-13 ENCOUNTER — Ambulatory Visit (INDEPENDENT_AMBULATORY_CARE_PROVIDER_SITE_OTHER): Payer: Medicaid Other | Admitting: Mental Health

## 2022-06-13 DIAGNOSIS — F3181 Bipolar II disorder: Secondary | ICD-10-CM

## 2022-06-13 DIAGNOSIS — F900 Attention-deficit hyperactivity disorder, predominantly inattentive type: Secondary | ICD-10-CM

## 2022-06-13 DIAGNOSIS — F411 Generalized anxiety disorder: Secondary | ICD-10-CM

## 2022-06-13 NOTE — Progress Notes (Signed)
THERAPIST PROGRESS NOTE Virtual Visit via Video Note  I connected with Michaela Williams on 06/13/22 at  9:00 AM EST by a video enabled telemedicine application and verified that I am speaking with the correct person using two identifiers.  Location: Patient: campus Provider: home office    I discussed the limitations of evaluation and management by telemedicine and the availability of in person appointments. The patient expressed understanding and agreed to proceed.  I discussed the assessment and treatment plan with the patient. The patient was provided an opportunity to ask questions and all were answered. The patient agreed with the plan and demonstrated an understanding of the instructions.   The patient was advised to call back or seek an in-person evaluation if the symptoms worsen or if the condition fails to improve as anticipated.  I provided 55 minutes of non-face-to-face time during this encounter.   Michaela Williams, [facility]   Session Time: 9:03am (55 minutes)  Participation Level: Active  Behavioral Response: CasualAlertAdequate  Type of Therapy: Individual Therapy  Treatment Goals addressed: STG: Michaela Williams maintain stability in moods AEB identification of x 3 factors that support mood management, attendance to daily routine with ability to reframe thinking patterns as needed within the next 6 months    STG: Michaela Williams will increase management of stressors AEB development of x 3 effective coping skills (distress tolerance, grounding relaxation etc) with ability to engage in effective problem solving skills within the next 6 months  ProgressTowards Goals: Progressing  Interventions: CBT and Supportive  Summary: Michaela Williams is a 26 y.o. female who presents with diagnoses of bipolar disorder and GAD. Michaela Williams presents alert and oriented; mood and affect WNL. Speech clear and coherent at normal rate and tone. Engaged and receptive to interventions. Smiles and laughs; pleasant  demeanor. Michaela Williams shares some feelings of being on edge due to not being able to have injectable medication but shares to feel as if she has been coping well and other medicatinos have been supported for sxs to not be too severe. Shares working on Training and development officer tasks in th ehome to her partner and workign to decrease levels of stress. Shares cocnering feelings of control and shares varying parenting styles from her partner and from her. States concern for when she starts to work and partner starts to go to school he will be more in charge of child rearing and concerned for his more laid back style. Able to process working to release control and understanding children will grow up to be their own independent individuals even with strong structure in place. Shares some improvement in social anxiety and able to attend in community without focusing on what others think of her. Shares to have worked to like her self more but can base her value of herself and like of her self based off being task oriented. Shares use of headphones to support. Shares plans to enjoy weekend without youngest daughter and able to catch up on sleep. Denies safety concoerns.  Maintaining stability in moods and reports good routine; actively working to reframe thinking patterns to reduce sxs of depression and anxiety. Medication compliant  Working to manage stressors with problem solving and working to access supports and delegate tasks.   Suicidal/Homicidal: Nowithout intent/plan  Therapist Response: Therapist engaged Michaela Williams in tele-therapy session. Completed check in and assessed for confidentiality and current location for OPT session. Assessed for current level of functioning sxs management and level of stressors. Provided supportive feedback; validated feelings. Engaged Michaela Williams in exploring  ability to navigate identifying ways to allow partner to support in child rearing. Explored various parenting styles with children growing to  make their own choices and deciding their own value system. Explored seeing strengths and drawbacks from laid back parenting vs. Highly structure ruled based parenting. Supported Teacher, English as a foreign language in processing ability to find balance and navigating releasing feelings of control. Encouraged ongoing work with anxiety in social settings and explored improvements noted. Reviewed session, provided follow up and assessed for safety.   Plan: Return again in  x 4 (due to holiday) weeks.  Diagnosis: Bipolar 2 disorder, major depressive episode (HCC)  Generalized anxiety disorder  Attention deficit hyperactivity disorder (ADHD), predominantly inattentive type  Collaboration of Care: Other None  Patient/Guardian was advised Release of Information must be obtained prior to any record release in order to collaborate their care with an outside provider. Patient/Guardian was advised if they have not already done so to contact the registration department to sign all necessary forms in order for [place] to release information regarding their care.   Consent: Patient/Guardian gives verbal consent for treatment and assignment of benefits for services provided during this visit. Patient/Guardian expressed understanding and agreed to proceed.   Rockey Situ [place], [facility] 06/13/2022

## 2022-06-16 ENCOUNTER — Other Ambulatory Visit (HOSPITAL_COMMUNITY): Payer: Self-pay | Admitting: Psychiatry

## 2022-06-16 ENCOUNTER — Telehealth (HOSPITAL_COMMUNITY): Payer: Self-pay

## 2022-06-16 DIAGNOSIS — F3181 Bipolar II disorder: Secondary | ICD-10-CM

## 2022-06-16 MED ORDER — ABILIFY MAINTENA 400 MG IM PRSY: 400.0000 mg | PREFILLED_SYRINGE | INTRAMUSCULAR | 11 refills | Status: DC

## 2022-06-16 NOTE — Telephone Encounter (Signed)
Medication reordered and sent to preferred pharmacy,.

## 2022-06-16 NOTE — Telephone Encounter (Signed)
Medication problem - Call from pharmacist at pt's [place] stating they needed a new order for patient's prescribe Abilify Maintena 400 mg IM injection with change to every 30 days as that is all the insurance would cover, not 28 days. Patient last seen 06/10/22 with plan to return 06/17/22 for next due injection.

## 2022-06-17 ENCOUNTER — Ambulatory Visit (HOSPITAL_COMMUNITY): Payer: Medicaid Other

## 2022-06-17 ENCOUNTER — Encounter (HOSPITAL_COMMUNITY): Payer: Self-pay

## 2022-06-17 VITALS — BP 113/75 | HR 102 | Ht 61.0 in | Wt 175.0 lb

## 2022-06-17 DIAGNOSIS — F3161 Bipolar disorder, current episode mixed, mild: Secondary | ICD-10-CM

## 2022-06-17 DIAGNOSIS — F3181 Bipolar II disorder: Secondary | ICD-10-CM

## 2022-06-17 DIAGNOSIS — F3162 Bipolar disorder, current episode mixed, moderate: Secondary | ICD-10-CM

## 2022-06-17 DIAGNOSIS — F900 Attention-deficit hyperactivity disorder, predominantly inattentive type: Secondary | ICD-10-CM

## 2022-06-17 DIAGNOSIS — F3130 Bipolar disorder, current episode depressed, mild or moderate severity, unspecified: Secondary | ICD-10-CM

## 2022-06-17 DIAGNOSIS — F411 Generalized anxiety disorder: Secondary | ICD-10-CM

## 2022-06-17 DIAGNOSIS — F314 Bipolar disorder, current episode depressed, severe, without psychotic features: Secondary | ICD-10-CM

## 2022-06-17 NOTE — Progress Notes (Signed)
In for her first Abilify M 400 mg in this clinic. She is currently doing well, states she had been on the abilify m shot in the past and preferred it to oral. She got her first shot today in her R DELTOID without issue. She is to return in 28 days.

## 2022-06-26 ENCOUNTER — Telehealth (HOSPITAL_COMMUNITY): Payer: Self-pay

## 2022-07-01 ENCOUNTER — Other Ambulatory Visit (HOSPITAL_COMMUNITY): Payer: Self-pay | Admitting: Psychiatry

## 2022-07-01 DIAGNOSIS — F900 Attention-deficit hyperactivity disorder, predominantly inattentive type: Secondary | ICD-10-CM

## 2022-07-01 MED ORDER — AMPHETAMINE-DEXTROAMPHETAMINE 10 MG PO TABS: 10.0000 mg | ORAL_TABLET | Freq: Every day | ORAL | 0 refills | Status: DC

## 2022-07-01 NOTE — Telephone Encounter (Signed)
Medication refilled and sent to preferred pharmacy

## 2022-07-18 ENCOUNTER — Ambulatory Visit (HOSPITAL_COMMUNITY): Payer: Medicaid Other | Admitting: Mental Health

## 2022-07-18 ENCOUNTER — Ambulatory Visit (INDEPENDENT_AMBULATORY_CARE_PROVIDER_SITE_OTHER): Payer: Medicaid Other | Admitting: Mental Health

## 2022-07-18 DIAGNOSIS — F3181 Bipolar II disorder: Secondary | ICD-10-CM

## 2022-07-18 DIAGNOSIS — F900 Attention-deficit hyperactivity disorder, predominantly inattentive type: Secondary | ICD-10-CM

## 2022-07-18 DIAGNOSIS — F411 Generalized anxiety disorder: Secondary | ICD-10-CM

## 2022-07-18 NOTE — Progress Notes (Unsigned)
THERAPIST PROGRESS NOTE Virtual Visit via Video Note  I connected with Michaela Williams on 07/18/22 at  9:00 AM EST by a video enabled telemedicine application and verified that I am speaking with the correct person using two identifiers.  Location: Patient: home address on file Provider: home office    I discussed the limitations of evaluation and management by telemedicine and the availability of in person appointments. The patient expressed understanding and agreed to proceed.  I discussed the assessment and treatment plan with the patient. The patient was provided an opportunity to ask questions and all were answered. The patient agreed with the plan and demonstrated an understanding of the instructions.   The patient was advised to call back or seek an in-person evaluation if the symptoms worsen or if the condition fails to improve as anticipated.  I provided 53 minutes of non-face-to-face time during this encounter.   Marion Downer, [facility]   Session Time: 9:05am (53 minutes)   Participation Level: Active  Behavioral Response: CasualAlertAdequate  Type of Therapy: Individual Therapy  Treatment Goals addressed: STG: Durene maintain stability in moods AEB identification of x 3 factors that support mood management, attendance to daily routine with ability to reframe thinking patterns as needed within the next 6 months   STG: Kinleigh will increase management of stressors AEB development of x 3 effective coping skills (distress tolerance, grounding relaxation etc) with ability to engage in effective problem solving skills within the next 6 months   ProgressTowards Goals: Progressing  Interventions: CBT and Supportive  Summary: Michaela Williams is a 27 y.o. female who presents with diagnoses of bipolar disorder and GAD. Amritha presents alert and oriented; mood and affect WNL. Speech clear and coherent at normal rate and tone. Acquanetta shares for moods to be stable and denies  excessive highs or lows. Shares presence of anxiety related to upcoming starting of new employment and decision to [employer] degree. Shares has been able to reduce anxiety related to fiance engaging with children more and shares history of being let down by others and this influencing her feelings of anxiety. Able to process with therapist working to engage with upcoming changes with openness and based of current evidence vs. Past concerns with no present evidence. Reports additional anxiety related to working to move into larger residence. Shares some decline in engagement in relaxation activities. Able to engage iwt therapist in exploring working to remain present focused. Denies safety concerns. Mood stable; anxiety sxs remain. Improvement with progress with goals.  GAD: 21 PHQ: 10   Suicidal/Homicidal: Nowithout intent/plan  Therapist Response: Therapist engaged Michaela Williams in [place] session. Completed check in and assessed for confidentiality and current location for OPT session. Assessed for current level of functioning sxs management and level of stressors. Provided supportive feedback; validated feelings. Engaged Michaela Williams in reviewing ongoing concerns for anxiety and factors that contribute to stressors. Explored engagement in coping and relaxation skills. Engaged in processing ability to remain in present with assumption and expectation of something negative occurring with no current evidence. Encouraged pt to work to remain present focused, reframe thoughts as needed engaging in enjoyable activities and self care. Reviewed session and provided follow up.    Plan: Return again in x 3 weeks.  Diagnosis: Bipolar 2 disorder, major depressive episode (HCC)  Generalized anxiety disorder  Attention deficit hyperactivity disorder (ADHD), predominantly inattentive type  Collaboration of Care: Other None  Patient/Guardian was advised Release of Information must be obtained prior to any  record release in order to collaborate their care with an outside provider. Patient/Guardian was advised if they have not already done so to contact the registration department to sign all necessary forms in order for [place] to release information regarding their care.   Consent: Patient/Guardian gives verbal consent for treatment and assignment of benefits for services provided during this visit. Patient/Guardian expressed understanding and agreed to proceed.   Rockey Situ [place], [facility] 07/18/2022

## 2022-08-01 ENCOUNTER — Telehealth (HOSPITAL_COMMUNITY): Payer: Self-pay

## 2022-08-01 NOTE — Telephone Encounter (Signed)
Pt requesting refill on Adderall. She is currently out. Last seen on 06/10/22. No Follow up scheduled. Set to see Shot clinic provider in February.

## 2022-08-04 ENCOUNTER — Encounter (HOSPITAL_COMMUNITY): Payer: Self-pay | Admitting: Psychiatry

## 2022-08-04 ENCOUNTER — Other Ambulatory Visit (HOSPITAL_COMMUNITY): Payer: Self-pay | Admitting: Psychiatry

## 2022-08-04 ENCOUNTER — Telehealth (INDEPENDENT_AMBULATORY_CARE_PROVIDER_SITE_OTHER): Payer: Medicaid Other | Admitting: Psychiatry

## 2022-08-04 DIAGNOSIS — F900 Attention-deficit hyperactivity disorder, predominantly inattentive type: Secondary | ICD-10-CM

## 2022-08-04 DIAGNOSIS — F3181 Bipolar II disorder: Secondary | ICD-10-CM

## 2022-08-04 DIAGNOSIS — F411 Generalized anxiety disorder: Secondary | ICD-10-CM | POA: Diagnosis not present

## 2022-08-04 MED ORDER — FLUOXETINE HCL 10 MG PO CAPS: 10.0000 mg | ORAL_CAPSULE | Freq: Every day | ORAL | 3 refills | Status: DC

## 2022-08-04 MED ORDER — HYDROXYZINE HCL 10 MG PO TABS: 10.0000 mg | ORAL_TABLET | Freq: Three times a day (TID) | ORAL | 3 refills | Status: DC | PRN

## 2022-08-04 MED ORDER — AMPHETAMINE-DEXTROAMPHETAMINE 10 MG PO TABS: 10.0000 mg | ORAL_TABLET | Freq: Every day | ORAL | 0 refills | Status: DC

## 2022-08-04 MED ORDER — ABILIFY MAINTENA 400 MG IM PRSY: 400.0000 mg | PREFILLED_SYRINGE | INTRAMUSCULAR | 11 refills | Status: DC

## 2022-08-04 NOTE — Progress Notes (Signed)
BH MD/PA/NP OP Progress Note    Virtual Visit via Video Note  I connected with Michaela Williams on 08/04/22 at 10:00 AM EST by a video enabled telemedicine application and verified that I am speaking with the correct person using two identifiers.  Location: Patient: Home Provider: Clinic   I discussed the limitations of evaluation and management by telemedicine and the availability of in person appointments. The patient expressed understanding and agreed to proceed.  I provided 30 minutes of non-face-to-face time during this encounter.      08/04/2022 9:22 AM Michaela Williams  MRN:  [id number]  Chief Complaint: "I am under the weather with my mood." Network engiree  HPI: 27 year old female seen today for follow up psychiatric evaluation.  She has a psychiatric history of bipolar 1 disorder, bipolar 2 disorder, adjustment disorder, anxiety, and depression.  She is currently managed on  Adderall 10 mg daily, Abilify 400 mg monthly, and Prozac 10 mg.  She notes that her medications are effective for managing her psychiatric conditions.    Today she was well-groomed, pleasant, cooperative, and engaged in conversation.  She informed Probation officer that she missed her January Abilify injection and feels under the weather with her mood.  Provider encouraged patient to walk into the clinic to have her injection.  She endorsed understanding and agreed.  Since her last visit she notes that she has been coping with her depression.  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  Provider conducted a GAD-7 and patient scored a 17, at her last visit she scored a 19.  Provider also conducted PHQ-9 and patient scored a 14, at her last visit she scored a 9.  She endorses hypersomnia noting that she sleeps 10 or more hours.  Today she denies SI/HI/VAH, mania,  paranoia.   Patient reports that she does not take Prozac frequently.  Provider encouraged patient to take Prozac as it would help her anxiety and depression.  She endorsed understanding and agreed.  No medication changes made today.  Patient agreeable to continue medication as prescribed.  Patient has not had labs drawn this year.  UDS, THS, LFT, CBC, BMP, and Hgb A1c ordered today.  No other concerns at this time.     Visit Diagnosis:    ICD-10-CM   1. Generalized anxiety disorder  F41.1 FLUoxetine (PROZAC) 10 MG capsule    hydrOXYzine (ATARAX) 10 MG tablet    2. Bipolar 2 disorder, major depressive episode (HCC)  F31.81 FLUoxetine (PROZAC) 10 MG capsule    ARIPiprazole ER (ABILIFY MAINTENA) 400 MG PRSY prefilled syringe    POCT Urine Drug Screen    CBC w/Diff/Platelet    Basic Metabolic Panel (BMET)    Thyroid Panel With TSH    Hepatic function panel      Past Psychiatric History: bipolar 1 disorder, bipolar 2 disorder, adjustment disorder, anxiety, and depression.    Past Medical History:  Past Medical History:  Diagnosis Date   Anemia    Anxiety    Asthma    in childhood    breast ca dx'd 05/2017   left   Breast cancer ([place]) 05/2017   dx at age 69   Depression    no meds in 3 yrs   Family history of breast cancer    Headache    migraines   Infection  UTI   Seizures ([place]) 2014   "seizure like activity", reaction to medicaiton, none since   UGI bleed 06/02/2017   Unwanted fertility 08/22/2020   BTL papers signed 08/20/20 Bilateral salpingectomy 09/2020    Past Surgical History:  Procedure Laterality Date   ESOPHAGOGASTRODUODENOSCOPY (EGD) WITH PROPOFOL N/A 06/03/2017   Procedure: ESOPHAGOGASTRODUODENOSCOPY (EGD) WITH PROPOFOL;  Surgeon: Ladene Artist, MD;  Location: [facility];  Service: Endoscopy;  Laterality: N/A;   IUD REMOVAL N/A 09/26/2020   Procedure: INTRAUTERINE DEVICE (IUD) REMOVAL;  Surgeon: Chancy Milroy, MD;  Location: [facility];  Service: Gynecology;  Laterality: N/A;   LAPAROSCOPIC BILATERAL SALPINGECTOMY Bilateral 09/26/2020   Procedure: LAPAROSCOPIC BILATERAL SALPINGECTOMY;  Surgeon: Chancy Milroy, MD;  Location: [facility];  Service: Gynecology;  Laterality: Bilateral;    Family Psychiatric History: Patient was adopted but noted that she was told that her biological mother may have had bipolar disorder or schizophrenia  Family History:  Family History  Adopted: Yes  Problem Relation Age of Onset   Mental illness Mother    Breast cancer Mother    Hypertension Maternal Grandmother    Diabetes Maternal Grandmother    Breast cancer Maternal Grandmother        d. in her 5s, "dealing with breast cancer all her life"   Breast cancer Maternal Aunt    Cervical cancer Maternal Aunt    Breast cancer Sister    Breast cancer Maternal Aunt    Breast cancer Maternal Aunt    Breast cancer Maternal Aunt    Breast cancer Maternal Aunt    Breast cancer Half-Sister        dx in her late 70s   Breast cancer Half-Sister        dx in her late 60s   Cervical cancer Half-Sister    Breast cancer Half-Sister        dx in her late 47s   Leukemia Nephew 4       currently in his teens    Social History:  Social History   Socioeconomic History   Marital status: Single    Spouse name: Not on file   Number of children: 1   Years of education: Not on file   Highest education level: Not on file  Occupational History   Occupation: unemployed  Tobacco Use   Smoking status: Former    Types: Cigarettes    Quit date: 12/19/2014    Years since quitting: 7.6   Smokeless tobacco: Never   Tobacco comments:    age 43  Vaping Use   Vaping Use: Former  Substance and Sexual Activity   Alcohol use: Not Currently    Comment: occasionally   Drug use: No   Sexual activity: Not Currently    Birth control/protection: None  Other Topics Concern   Not on file  Social History Narrative   Not on file    Social Determinants of Health   Financial Resource Strain: Medium Risk (04/11/2022)   Overall Financial Resource Strain (CARDIA)    Difficulty of Paying Living Expenses: Somewhat hard  Food Insecurity: Food Insecurity Present (04/11/2022)   Hunger Vital Sign    Worried About [place] in the Last Year: Sometimes true    Ran Out of Food in the Last Year: Sometimes true  Transportation Needs: No Transportation Needs (04/11/2022)   PRAPARE - Hydrologist (Medical): No    Lack of Transportation (Non-Medical):  No  Physical Activity: Inactive (04/11/2022)   Exercise Vital Sign    Days of Exercise per Week: 0 days    Minutes of Exercise per Session: 0 min  Stress: Stress Concern Present (04/11/2022)   [place]    Feeling of Stress : Very much  Social Connections: Moderately Integrated (04/11/2022)   Social Connection and Isolation Panel [NHANES]    Frequency of Communication with Friends and Family: Once a week    Frequency of Social Gatherings with Friends and Family: Never    Attends Religious Services: More than 4 times per year    Active Member of [employer] or Organizations: No    Attends Archivist Meetings: 1 to 4 times per year    Marital Status: Living with partner    Allergies:  Allergies  Allergen Reactions   Quetiapine Fumarate     Other reaction(s): Other Black outs/oversedation   Flexeril [Cyclobenzaprine]     Patient had a near syncopal event following Flexeril PO in MAU 11/21/2019   Lithium Other (See Comments)    SEIZURES, Childhood reaction   Lexapro [Escitalopram] Other (See Comments)    Didn't like how it made her feel    Metabolic Disorder Labs: Lab Results  Component Value Date   HGBA1C 5.0 07/03/2017   MPG 96.8 07/03/2017   MPG 96.8 07/01/2017   Lab Results  Component Value Date   PROLACTIN 184.0 (H) 07/01/2017   Lab Results  Component  Value Date   CHOL 196 07/03/2017   TRIG 45 07/03/2017   HDL 63 07/03/2017   CHOLHDL 3.1 07/03/2017   VLDL 9 07/03/2017   LDLCALC 124 (H) 07/03/2017   Lab Results  Component Value Date   TSH 1.650 12/07/2018   TSH 1.350 01/06/2018    Therapeutic Level Labs: No results found for: "LITHIUM" No results found for: "VALPROATE" No results found for: "CBMZ"  Current Medications: Current Outpatient Medications  Medication Sig Dispense Refill   amphetamine-dextroamphetamine (ADDERALL) 10 MG tablet Take 1 tablet (10 mg total) by mouth daily. 30 tablet 0   ARIPiprazole ER (ABILIFY MAINTENA) 400 MG PRSY prefilled syringe Inject 400 mg into the muscle every 30 (thirty) days. 1 each 11   FLUoxetine (PROZAC) 10 MG capsule Take 1 capsule (10 mg total) by mouth daily. 30 capsule 3   hydrOXYzine (ATARAX) 10 MG tablet Take 1 tablet (10 mg total) by mouth 3 (three) times daily as needed. 90 tablet 3   Current Facility-Administered Medications  Medication Dose Route Frequency Provider Last Rate Last Admin   ARIPiprazole ER (ABILIFY MAINTENA) 400 MG prefilled syringe 400 mg  400 mg Intramuscular Q28 days Eulis Canner E, NP   400 mg at 06/17/22 1101     Musculoskeletal: Strength & Muscle Tone: within normal limits and telehealth visit Gait & Station: normal, telehealth visit Patient leans: N/A  Psychiatric Specialty Exam: Review of Systems  currently breastfeeding.There is no height or weight on file to calculate BMI.  General Appearance: Well Groomed  Eye Contact:  Good  Speech:  Clear and Coherent and Normal Rate  Volume:  Normal  Mood:  Anxious and Depressed  Affect:  Congruent and Non-Congruent  Thought Process:  Coherent, Goal Directed and Linear  Orientation:  Full (Time, Place, and Person)  Thought Content: WDL and Logical   Suicidal Thoughts:  No  Homicidal Thoughts:  No  Memory:  Immediate;   Good Recent;   Good Remote;   Good  Judgement:  Good  Insight:  Good   Psychomotor Activity:  Normal  Concentration:  Concentration: Good and Attention Span: Good  Recall:  Good  Fund of Knowledge: Good  Language: Good  Akathisia:  No  Handed:  Right  AIMS (if indicated):Not done  Assets:  Communication Skills Desire for Improvement Financial Resources/Insurance Housing Intimacy Social Support  ADL's:  Intact  Cognition: WNL  Sleep:  Good   Screenings: AIMS    Flowsheet Row Admission (Discharged) from OP Visit from 07/01/2017 in [facility]  AIMS Total Score 0      AUDIT    Flowsheet Row Admission (Discharged) from OP Visit from 07/01/2017 in [facility]  Alcohol Use Disorder Identification Test Final Score (AUDIT) 1      GAD-7    Flowsheet Row Video Visit from 08/04/2022 in [facility] Counselor from 07/18/2022 in [facility] Video Visit from 06/10/2022 in [facility] Counselor from 04/11/2022 in [facility] Video Visit from 03/20/2022 in [facility]  Total GAD-7 Score '17 21 19 19 17      '$ PHQ2-9    Flowsheet Row Video Visit from 08/04/2022 in [facility] Counselor from 07/18/2022 in [facility] Video Visit from 06/10/2022 in [facility] Counselor from 04/11/2022 in [facility] Video Visit from 03/20/2022 in [facility]  PHQ-2 Total Score 3 2 0 3 5  PHQ-9 Total Score '14 10 9 15 24      '$ Flowsheet Row ED from 06/04/2022 in [facility] ED from 06/03/2022 in [facility] Department at [facility] Counselor from 04/11/2022 in [place] No Risk No Risk Low Risk        Assessment and  Plan: Patient notes that she missed her last injection with shot clinic and has been feeling down. She also notes that she has been anxious and sleeping more. Provider encouraged patient to come and get her Abilify injection. She endorsed understanding and agreed. Patient reports that she does not take Prozac frequently.  Provider encouraged patient to take Prozac as it would help her anxiety and depression.  She endorsed understanding and agreed.  No medication changes made today.  Patient agreeable to continue medication as prescribed.  Patient has not had labs drawn this year.  UDS, THS, LFT, CBC, BMP, and Hgb A1c ordered today.  1. Generalized anxiety disorder  Continue- FLUoxetine (PROZAC) 10 MG capsule; Take 1 capsule (10 mg total) by mouth daily.  Dispense: 30 capsule; Refill: 3 Continue- hydrOXYzine (ATARAX) 10 MG tablet; Take 1 tablet (10 mg total) by mouth 3 (three) times daily as needed.  Dispense: 90 tablet; Refill: 3  2. Bipolar 2 disorder, major depressive episode (HCC)  Continue- FLUoxetine (PROZAC) 10 MG capsule; Take 1 capsule (10 mg total) by mouth daily.  Dispense: 30 capsule; Refill: 3 Continue- ARIPiprazole ER (ABILIFY MAINTENA) 400 MG PRSY prefilled syringe; Inject 400 mg into the muscle every 30 (thirty) days.  Dispense: 1 each; Refill: 11 - POCT Urine Drug Screen - CBC w/Diff/Platelet - Basic Metabolic Panel (BMET) - Thyroid Panel With TSH - Hepatic function panel - HgB A1c   Follow-up in 3 months Follow-up with therapy Salley Slaughter, NP 08/04/2022, 9:22 AM

## 2022-08-04 NOTE — Telephone Encounter (Signed)
Medication refilled and sent to preferred pharmacy. Patient will need to reschedule appointment for further refills.

## 2022-08-08 ENCOUNTER — Ambulatory Visit (HOSPITAL_COMMUNITY): Payer: Medicaid Other | Admitting: Mental Health

## 2022-08-08 DIAGNOSIS — F3130 Bipolar disorder, current episode depressed, mild or moderate severity, unspecified: Secondary | ICD-10-CM

## 2022-08-08 NOTE — Progress Notes (Signed)
Pt connected to [facility] and reported unable to complete appointment today; sharing to be a doctor's appointment. Rescheduled for 09/08/22 @ 1pm for virtual follow up.

## 2022-09-01 ENCOUNTER — Ambulatory Visit (HOSPITAL_COMMUNITY): Payer: Medicaid Other

## 2022-09-08 ENCOUNTER — Ambulatory Visit (INDEPENDENT_AMBULATORY_CARE_PROVIDER_SITE_OTHER): Payer: Medicaid Other | Admitting: Mental Health

## 2022-09-08 DIAGNOSIS — F3181 Bipolar II disorder: Secondary | ICD-10-CM

## 2022-09-08 DIAGNOSIS — F411 Generalized anxiety disorder: Secondary | ICD-10-CM

## 2022-09-08 NOTE — Progress Notes (Signed)
THERAPIST PROGRESS NOTE Virtual Visit via Video Note  I connected with Michaela Williams on 09/08/22 at  1:00 PM EST by a video enabled telemedicine application and verified that I am speaking with the correct person using two identifiers.  Location: Patient: in car parking lot of employment Provider: office    I discussed the limitations of evaluation and management by telemedicine and the availability of in person appointments. The patient expressed understanding and agreed to proceed.  I discussed the assessment and treatment plan with the patient. The patient was provided an opportunity to ask questions and all were answered. The patient agreed with the plan and demonstrated an understanding of the instructions.   The patient was advised to call back or seek an in-person evaluation if the symptoms worsen or if the condition fails to improve as anticipated.  I provided 35 minutes of non-face-to-face time during this encounter.   Dorris Singh, [facility]   Session Time: 1:01pm (35 minutes)   Participation Level: Active  Behavioral Response: NeatAlertAdequate  Type of Therapy: Individual Therapy  Treatment Goals addressed: STG: Michaela Williams maintain stability in moods AEB identification of x 3 factors that support mood management, attendance to daily routine with ability to reframe thinking patterns as needed within the next 6 months   STG: Michaela Williams will increase management of stressors AEB development of x 3 effective coping skills (distress tolerance, grounding relaxation etc) with ability to engage in effective problem solving skills within the next 6 months   ProgressTowards Goals: Progressing  Interventions: CBT and Supportive  Summary: Michaela Williams is a 27 y.o. female who presents with diagnoses of bipolar disorder and GAD. Michaela Williams presents alert and oriented; mood and affect WNL. Speech clear and coherent at normal rate and tone. Michaela Williams denies for extreme highs or lows but  shares increase in feelings of being overwhelmed and worry. Notes to have started to work in new position and for this to have caused increase strain and stress in her relationship. Notes for fiance to have started to take concern with her inability to cook home cooked meals daily and decrease in time in which they spend together. Shares to work full time now and to also attend course work in the evenings. Shares schedule to be tight in which she works 8 to 5 and attends coursework from 5:30 to [employer]. Careers adviser for son to be engaged in after school activities and son's father and stated she needs to "step it up" and help with transportation to events. Notes to handle all other appointments, school and child raising of their son together and unable to assist with after school activities at this time. Able to process with therapist working to set realistic expectations of self and communicating needs to fiance. Explored working to explore compromise to avoid feelings of bitterness and resentment. Agrees to communicate with partner and son's father about support needed at this time. Shares to have incorporated daily routine and working to engage in balanced thinking with areas of maladaptive patterns noted. Engagement in relaxation coping skills and working to engage in effective problem solving to reduce stressors. Sxs stable at this time; progress with goals noted. No safety concerns reported. No SI/HI  Suicidal/Homicidal: Nowithout intent/plan  Therapist Response: Therapist engaged [name] in tele-therapy session. Completed check in and assessed for confidentiality and current location for OPT session. Assessed for current level of functioning sxs management and level of stressors. Provided supportive feedback; validated feelings.Provided safe space for Michaela Williams to share thoughts  and concerns in regards to increase in activity during the day and working towards personal goals while balancing home life. Explored areas in  which she can delegate tasks to partner and working to ask for support in which she needs and setting appropriate expectations for self during this time. Encouraged ability to communicate needs. Explored with Michaela Williams areas in which fiance could increase his attendance to the home with him currently being a stay at home father. Reviewed session; provided follow up appointment. No safety concerns noted.   Plan: Return again in  x 3 weeks.  Diagnosis: Bipolar 2 disorder, major depressive episode (HCC)  Generalized anxiety disorder  Collaboration of Care: Other None  Patient/Guardian was advised Release of Information must be obtained prior to any record release in order to collaborate their care with an outside provider. Patient/Guardian was advised if they have not already done so to contact the registration department to sign all necessary forms in order for [place] to release information regarding their care.   Consent: Patient/Guardian gives verbal consent for treatment and assignment of benefits for services provided during this visit. Patient/Guardian expressed understanding and agreed to proceed.   Stephan Minister [place], [facility] 09/08/2022

## 2022-09-19 ENCOUNTER — Telehealth (HOSPITAL_COMMUNITY): Payer: Self-pay | Admitting: Psychiatry

## 2022-09-22 ENCOUNTER — Other Ambulatory Visit (HOSPITAL_COMMUNITY): Payer: Self-pay | Admitting: Psychiatry

## 2022-09-22 DIAGNOSIS — F900 Attention-deficit hyperactivity disorder, predominantly inattentive type: Secondary | ICD-10-CM

## 2022-09-22 MED ORDER — AMPHETAMINE-DEXTROAMPHETAMINE 10 MG PO TABS: 10.0000 mg | ORAL_TABLET | Freq: Every day | ORAL | 0 refills | Status: DC

## 2022-09-22 NOTE — Telephone Encounter (Signed)
Medication refilled and sent to preferred pharmacy

## 2022-10-16 ENCOUNTER — Encounter (HOSPITAL_COMMUNITY): Payer: Self-pay

## 2022-10-16 ENCOUNTER — Ambulatory Visit (INDEPENDENT_AMBULATORY_CARE_PROVIDER_SITE_OTHER): Payer: BC Managed Care – PPO | Admitting: Mental Health

## 2022-10-16 ENCOUNTER — Ambulatory Visit (INDEPENDENT_AMBULATORY_CARE_PROVIDER_SITE_OTHER): Payer: BC Managed Care – PPO

## 2022-10-16 VITALS — BP 113/76 | HR 77 | Ht 61.0 in | Wt 129.4 lb

## 2022-10-16 DIAGNOSIS — F411 Generalized anxiety disorder: Secondary | ICD-10-CM

## 2022-10-16 DIAGNOSIS — F3161 Bipolar disorder, current episode mixed, mild: Secondary | ICD-10-CM

## 2022-10-16 DIAGNOSIS — F3181 Bipolar II disorder: Secondary | ICD-10-CM | POA: Diagnosis not present

## 2022-10-16 DIAGNOSIS — F900 Attention-deficit hyperactivity disorder, predominantly inattentive type: Secondary | ICD-10-CM

## 2022-10-16 DIAGNOSIS — F314 Bipolar disorder, current episode depressed, severe, without psychotic features: Secondary | ICD-10-CM

## 2022-10-16 NOTE — Progress Notes (Signed)
THERAPIST PROGRESS NOTE Virtual Visit via Video Note  I connected with Michaela Williams on 10/16/22 at 12:30 PM EDT by a video enabled telemedicine application and verified that I am speaking with the correct person using two identifiers.  Location: Patient: home address on file Provider: office    I discussed the limitations of evaluation and management by telemedicine and the availability of in person appointments. The patient expressed understanding and agreed to proceed.  I discussed the assessment and treatment plan with the patient. The patient was provided an opportunity to ask questions and all were answered. The patient agreed with the plan and demonstrated an understanding of the instructions.   The patient was advised to call back or seek an in-person evaluation if the symptoms worsen or if the condition fails to improve as anticipated.  I provided  minutes of non-face-to-face time during this encounter.   Michaela Williams, Michaela Williams  Session Time: 12:32 pm ( 32 minutes)  Participation Level: Active  Behavioral Response: CasualAlertEuthymic  Type of Therapy: Individual Therapy  Treatment Goals addressed: STG: Michaela Williams maintain stability in moods AEB identification of x 3 factors that support mood management, attendance to daily routine with ability to reframe thinking patterns as needed within the next 6 months    STG: Michaela Williams will increase management of stressors AEB development of x 3 effective coping skills (distress tolerance, grounding relaxation etc) with ability to engage in effective problem solving skills within the next 6 months   ProgressTowards Goals: Progressing  Interventions: CBT and Supportive  Summary: Michaela Williams is a 27 y.o. female who presents with diagnoses of bipolar disorder and GAD. Michaela Williams presents alert and oriented; mood and affect anxious. Speech clear and coherent at normal rate and tone. Michaela Williams endorses current concern for manic episode to  be presenting. Shares to have not had injectable medication since February due to change in routine and not being able to attend appointments due to work schedule. Shares increase in irritability, agitation, increased difficulty with sleeping when tired, increased energy. Shares to feel uncomfortable and has had difficulty at home. Identifies with therapist history of mania episode to include impulsivity, increase in spending and self-harm with suicidal thoughts in the past and has shared thoughts of running off road while driving. Denies current thoughts or plan but concern for thoughts to increase without medications. Able to engage with therapist plan moving forward to ensure compliance with medications to work to maintain stability in moods. Agreeable to presenting to office to obtain injectable medication today. Decompensation with mood; increase in mania sxs. Regression with goals.   Suicidal/Homicidal: Nowithout intent/plan  Therapist Response: Therapist engaged Michaela Williams in tele-therapy session. Completed check in and assessed for confidentiality and current location for OPT session. Assessed for current level of functioning sxs management and level of stressors. Provided supportive feedback; validated feelings. Explored with Michaela Williams barriers to obtaining medications and explored in solution buiding to work to obtain medication on schedule. Explored presence of mania sxs and explored severity of mania in the past. Explore current level of functioning and safety concerns. Assessed for suicidal thoughts at present. Supported in contact with Michaela Williams (Michaela Williams)to explore ability to obtain injectable medications today with confirmation of ability by 3pm today. Explored with Michaela Williams ability to maintain ability to take care of mental health as to not compromise progress she as made and ability to maintain employment. Reviewed session, assessed for safety and concluded session early for presentation onsite for injection.    Plan: Return  again in  x 4 weeks.  Diagnosis: Bipolar 1 disorder, mixed, mild  Generalized anxiety disorder  Attention deficit hyperactivity disorder (ADHD), predominantly inattentive type  Collaboration of Care: Other None  Patient/Guardian was advised Release of Information must be obtained prior to any record release in order to collaborate their care with an outside provider. Patient/Guardian was advised if they have not already done so to contact the registration department to sign all necessary forms in order for us to release information regarding their care.   Consent: Patient/Guardian gives verbal consent for treatment and assignment of benefits for services provided during this visit. Patient/Guardian expressed understanding and agreed to proceed.   Michaela Williams, Michaela Williams, Michaela Williams 10/16/2022

## 2022-10-16 NOTE — Progress Notes (Signed)
In for her first Abilify M 400 mg in this clinic. She was late for her LAI . She got her shot today in her  L DELTOID without issue. She is to return in 28 days.

## 2022-10-21 ENCOUNTER — Ambulatory Visit (HOSPITAL_COMMUNITY): Payer: BC Managed Care – PPO

## 2022-10-23 ENCOUNTER — Telehealth (HOSPITAL_COMMUNITY): Payer: Self-pay | Admitting: Psychiatry

## 2022-10-23 ENCOUNTER — Other Ambulatory Visit (HOSPITAL_COMMUNITY): Payer: Self-pay | Admitting: Psychiatry

## 2022-10-23 DIAGNOSIS — F900 Attention-deficit hyperactivity disorder, predominantly inattentive type: Secondary | ICD-10-CM

## 2022-10-23 MED ORDER — AMPHETAMINE-DEXTROAMPHETAMINE 10 MG PO TABS: 10.0000 mg | ORAL_TABLET | Freq: Every day | ORAL | 0 refills | Status: DC

## 2022-10-23 NOTE — Telephone Encounter (Signed)
Patient called requesting refills of Adderall.  Medication was refilled and sent to preferred pharmacy.  Patient needs follow-up visit for further refills.  Front desk staff instructed to call patient to reschedule.

## 2022-10-24 ENCOUNTER — Telehealth (HOSPITAL_COMMUNITY): Payer: Self-pay | Admitting: Psychiatry

## 2022-11-13 ENCOUNTER — Encounter (HOSPITAL_COMMUNITY): Payer: Self-pay

## 2022-11-13 ENCOUNTER — Ambulatory Visit (HOSPITAL_COMMUNITY): Payer: BC Managed Care – PPO

## 2022-11-13 ENCOUNTER — Ambulatory Visit (INDEPENDENT_AMBULATORY_CARE_PROVIDER_SITE_OTHER): Payer: BC Managed Care – PPO | Admitting: Psychiatry

## 2022-11-13 DIAGNOSIS — F419 Anxiety disorder, unspecified: Secondary | ICD-10-CM | POA: Diagnosis not present

## 2022-11-13 DIAGNOSIS — R109 Unspecified abdominal pain: Secondary | ICD-10-CM | POA: Diagnosis not present

## 2022-11-13 DIAGNOSIS — F331 Major depressive disorder, recurrent, moderate: Secondary | ICD-10-CM | POA: Diagnosis not present

## 2022-11-13 DIAGNOSIS — F902 Attention-deficit hyperactivity disorder, combined type: Secondary | ICD-10-CM | POA: Diagnosis not present

## 2022-11-13 DIAGNOSIS — F3181 Bipolar II disorder: Secondary | ICD-10-CM | POA: Diagnosis not present

## 2022-11-13 DIAGNOSIS — F411 Generalized anxiety disorder: Secondary | ICD-10-CM | POA: Diagnosis not present

## 2022-11-13 MED ORDER — ARIPIPRAZOLE LAUROXIL ER 1064 MG/3.9ML IM PRSY: 1064.0000 mg | PREFILLED_SYRINGE | INTRAMUSCULAR | Status: DC

## 2022-11-13 MED ORDER — HYDROXYZINE HCL 10 MG PO TABS: 10.0000 mg | ORAL_TABLET | Freq: Three times a day (TID) | ORAL | 3 refills | Status: DC | PRN

## 2022-11-13 MED ORDER — ARISTADA 1064 MG/3.9ML IM PRSY: 1064.0000 mg | PREFILLED_SYRINGE | INTRAMUSCULAR | 11 refills | Status: DC

## 2022-11-13 MED ORDER — FLUOXETINE HCL 10 MG PO CAPS: 10.0000 mg | ORAL_CAPSULE | Freq: Every day | ORAL | 3 refills | Status: DC

## 2022-11-13 NOTE — Progress Notes (Signed)
Patient arrived without medication for her injection of Abilify Maintena 400mg . States she forgot her medication but wanted to come to see the MD to discuss her medications. She will bring it back at a later date if they decide to keep her on the same dosage. She wants to discuss a longer acting medication.

## 2022-11-13 NOTE — Progress Notes (Signed)
BH MD/PA/NP OP Progress Note         11/13/2022 4:35 PM Michaela Williams  MRN:  [id number]  Chief Complaint: "I want me medication changed."   HPI: 27 year old female seen today for follow up psychiatric evaluation.  She has a psychiatric history of bipolar 1 disorder, bipolar 2 disorder, adjustment disorder, anxiety, and depression.  She is currently managed on  Adderall 10 mg daily, Abilify 400 mg monthly, and Prozac 10 mg.  She notes that her medications are effective for managing her psychiatric conditions but notes that Abilify does not work with her life style.    Today she was well-groomed, pleasant, cooperative, and engaged in conversation.  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 She reports that she works 24 hours daily. Provider asked patient if this is sustainable. She notes that she and her partner are trying to buy a home.  She reports that taking up monthly to get her injection is overwhelming and she does not have a lot of PTO.  Provider informed patient Julianne Rice which can be administered every 2 months.  She notes that she would be interested in this.    Patient reports that her mood is stable and reports that she has minimal anxiety and depression.  Today provider conducted a GAD-7 and patient scored a 13, at her last visit she scored a 17.  Provider also conducted PHQ-9 he scored a 14, at her last visit she scored a 14.  She reports that her sleep is poor noting that she sleeps 4 to 5 hours nightly.  She does inform her that her appetite is adequate.  Today she denies SI/HI/VAH, mania, or paranoia.  Today patient agreeable to starting Aristada 1064 mg every two months. Potential side effects of medication and risks vs benefits of treatment vs non-treatment were explained and discussed. All questions were answered.  I am just finishing up she will continue all other medications as prescribed. No other concerns noted at this  time.        Visit Diagnosis:    ICD-10-CM   1. Generalized anxiety disorder  F41.1 FLUoxetine (PROZAC) 10 MG capsule    hydrOXYzine (ATARAX) 10 MG tablet    2. Bipolar 2 disorder, major depressive episode (HCC)  F31.81 ARIPiprazole Lauroxil ER (ARISTADA) 1064 MG/3.9ML prefilled syringe 1,064 mg    ARIPiprazole Lauroxil ER (ARISTADA) 1064 MG/3.9ML prefilled syringe    FLUoxetine (PROZAC) 10 MG capsule      Past Psychiatric History: bipolar 1 disorder, bipolar 2 disorder, adjustment disorder, anxiety, and depression.    Past Medical History:  Past Medical History:  Diagnosis Date   Anemia    Anxiety    Asthma    in childhood    breast ca dx'd 05/2017   left   Breast cancer (HCC) 05/2017   dx at age 58   Depression    no meds in 3 yrs   Family history of breast cancer    Headache    migraines   Infection    UTI   Seizures (HCC) 2014   "seizure like activity", reaction to medicaiton, none since   UGI bleed 06/02/2017   Unwanted fertility 08/22/2020   BTL papers signed 08/20/20 Bilateral salpingectomy 09/2020    Past Surgical History:  Procedure Laterality Date   ESOPHAGOGASTRODUODENOSCOPY (EGD) WITH PROPOFOL N/A 06/03/2017   Procedure: ESOPHAGOGASTRODUODENOSCOPY (EGD) WITH PROPOFOL;  Surgeon: Meryl Dare, MD;  Location: [facility];  Service: Endoscopy;  Laterality: N/A;   IUD REMOVAL N/A 09/26/2020   Procedure: INTRAUTERINE DEVICE (IUD) REMOVAL;  Surgeon: Hermina Staggers, MD;  Location: [facility];  Service: Gynecology;  Laterality: N/A;   LAPAROSCOPIC BILATERAL SALPINGECTOMY Bilateral 09/26/2020   Procedure: LAPAROSCOPIC BILATERAL SALPINGECTOMY;  Surgeon: Hermina Staggers, MD;  Location: [facility];  Service: Gynecology;  Laterality: Bilateral;    Family Psychiatric History: Patient was adopted but noted that she was told that her biological mother may have had bipolar disorder or schizophrenia  Family History:  Family History   Adopted: Yes  Problem Relation Age of Onset   Mental illness Mother    Breast cancer Mother    Hypertension Maternal Grandmother    Diabetes Maternal Grandmother    Breast cancer Maternal Grandmother        d. in her 79s, "dealing with breast cancer all her life"   Breast cancer Maternal Aunt    Cervical cancer Maternal Aunt    Breast cancer Sister    Breast cancer Maternal Aunt    Breast cancer Maternal Aunt    Breast cancer Maternal Aunt    Breast cancer Maternal Aunt    Breast cancer Half-Sister        dx in her late 63s   Breast cancer Half-Sister        dx in her late 18s   Cervical cancer Half-Sister    Breast cancer Half-Sister        dx in her late 95s   Leukemia Nephew 4       currently in his teens    Social History:  Social History   Socioeconomic History   Marital status: Single    Spouse name: Not on file   Number of children: 1   Years of education: Not on file   Highest education level: Not on file  Occupational History   Occupation: unemployed  Tobacco Use   Smoking status: Former    Types: Cigarettes    Quit date: 12/19/2014    Years since quitting: 7.9   Smokeless tobacco: Never   Tobacco comments:    age 48  Vaping Use   Vaping Use: Former  Substance and Sexual Activity   Alcohol use: Not Currently    Comment: occasionally   Drug use: No   Sexual activity: Not Currently    Birth control/protection: None  Other Topics Concern   Not on file  Social History Narrative   Not on file   Social Determinants of Health   Financial Resource Strain: Medium Risk (04/11/2022)   Overall Financial Resource Strain (CARDIA)    Difficulty of Paying Living Expenses: Somewhat hard  Food Insecurity: Food Insecurity Present (04/11/2022)   Hunger Vital Sign    Worried About Running Out of Food in the Last Year: Sometimes true    Ran Out of Food in the Last Year: Sometimes true  Transportation Needs: No Transportation Needs (04/11/2022)   PRAPARE -  Administrator, Civil Service (Medical): No    Lack of Transportation (Non-Medical): No  Physical Activity: Inactive (04/11/2022)   Exercise Vital Sign    Days of Exercise per Week: 0 days    Minutes of Exercise per Session: 0 min  Stress: Stress Concern Present (04/11/2022)   [employer] of Occupational Health - Occupational Stress Questionnaire    Feeling of Stress : Very much  Social Connections: Moderately Integrated (04/11/2022)   Social Connection and Isolation  Panel [NHANES]    Frequency of Communication with Friends and Family: Once a week    Frequency of Social Gatherings with Friends and Family: Never    Attends Religious Services: More than 4 times per year    Active Member of [employer] or Organizations: No    Attends Banker Meetings: 1 to 4 times per year    Marital Status: Living with partner    Allergies:  Allergies  Allergen Reactions   Quetiapine Fumarate     Other reaction(s): Other Black outs/oversedation   Flexeril [Cyclobenzaprine]     Patient had a near syncopal event following Flexeril PO in MAU 11/21/2019   Lithium Other (See Comments)    SEIZURES, Childhood reaction   Lexapro [Escitalopram] Other (See Comments)    Didn't like how it made her feel    Metabolic Disorder Labs: Lab Results  Component Value Date   HGBA1C 5.0 07/03/2017   MPG 96.8 07/03/2017   MPG 96.8 07/01/2017   Lab Results  Component Value Date   PROLACTIN 184.0 (H) 07/01/2017   Lab Results  Component Value Date   CHOL 196 07/03/2017   TRIG 45 07/03/2017   HDL 63 07/03/2017   CHOLHDL 3.1 07/03/2017   VLDL 9 07/03/2017   LDLCALC 124 (H) 07/03/2017   Lab Results  Component Value Date   TSH 1.650 12/07/2018   TSH 1.350 01/06/2018    Therapeutic Level Labs: No results found for: "LITHIUM" No results found for: "VALPROATE" No results found for: "CBMZ"  Current Medications: Current Outpatient Medications  Medication Sig Dispense Refill    ARIPiprazole Lauroxil ER (ARISTADA) 1064 MG/3.9ML prefilled syringe Inject 1,064 mg into the muscle every 2 (two) months. 1.8 mL 11   amphetamine-dextroamphetamine (ADDERALL) 10 MG tablet Take 1 tablet (10 mg total) by mouth daily. 30 tablet 0   FLUoxetine (PROZAC) 10 MG capsule Take 1 capsule (10 mg total) by mouth daily. 30 capsule 3   hydrOXYzine (ATARAX) 10 MG tablet Take 1 tablet (10 mg total) by mouth 3 (three) times daily as needed. 90 tablet 3   Current Facility-Administered Medications  Medication Dose Route Frequency Provider Last Rate Last Admin   ARIPiprazole Lauroxil ER (ARISTADA) 1064 MG/3.9ML prefilled syringe 1,064 mg  1,064 mg Intramuscular Q2 months Shanna Cisco, NP         Musculoskeletal: Strength & Muscle Tone: within normal limits Gait & Station: normal Patient leans: N/A  Psychiatric Specialty Exam: Review of Systems  currently breastfeeding.There is no height or weight on file to calculate BMI.  General Appearance: Well Groomed  Eye Contact:  Good  Speech:  Clear and Coherent and Normal Rate  Volume:  Normal  Mood:  Euthymic and anxiety and depression improving  Affect:  Congruent and Non-Congruent  Thought Process:  Coherent, Goal Directed and Linear  Orientation:  Full (Time, Place, and Person)  Thought Content: WDL and Logical   Suicidal Thoughts:  No  Homicidal Thoughts:  No  Memory:  Immediate;   Good Recent;   Good Remote;   Good  Judgement:  Good  Insight:  Good  Psychomotor Activity:  Normal  Concentration:  Concentration: Good and Attention Span: Good  Recall:  Good  Fund of Knowledge: Good  Language: Good  Akathisia:  No  Handed:  Right  AIMS (if indicated):Not done  Assets:  Communication Skills Desire for Improvement Financial Resources/Insurance Housing Intimacy Social Support  ADL's:  Intact  Cognition: WNL  Sleep:  Fair   Screenings:  AIMS    Flowsheet Row Office Visit from 11/13/2022 in [facility] Ad[facility] (Discharged[facility]  AIMS Total Score 0 0      AUDIT    Flowsheet Row Admission (Discharged) [facility]  Alcohol Use Disorder Identification Test Final Score (AUDIT) 1      GAD-7    Flowsheet Row Office Visit from 11/13/2022 in [facility] Video Visit from 08/04/2022 in [facility] Counselor from 07/18/2022 in [facility] Video Visit from 06/10/2022 in [facility] Counselor from 04/11/2022 in [facility]  Total GAD-7 Score 13 17 21 19 19       PHQ2-9    Flowsheet Row Office Visit from 11/13/2022 in [facility] Video Visit from 08/04/2022 in [facility] Counselor from 07/18/2022 in [facility] Video Visit from 06/10/2022 in [facility] Counselor from 04/11/2022 in [facility]  PHQ-2 Total Score 2 3 2  0 3  PHQ-9 Total Score 14 14 10 9 15       Flowsheet Row ED from 06/04/2022 in [facility] at [facility] ED from 06/03/2022 in [facility] Emergency Department at [facility] Counselor from 04/11/2022 in [facility]  C-SSRS RISK CATEGORY No Risk No Risk Low Risk        Assessment and Plan: Patient informed writer that her lifestyle is too hectic to take off from work monthly to get her Abilify injection.  Provider discussed starting Aristada.  Patient was agreeable to starting Aristada 1064 every 2 months.  She will continue her other medication as prescribed.  No other concerns at this time.  1. Generalized anxiety disorder  Continue- FLUoxetine (PROZAC) 10 MG capsule; Take 1 capsule  (10 mg total) by mouth daily.  Dispense: 30 capsule; Refill: 3 Continue- hydrOXYzine (ATARAX) 10 MG tablet; Take 1 tablet (10 mg total) by mouth 3 (three) times daily as needed.  Dispense: 90 tablet; Refill: 3  2. Bipolar 2 disorder, major depressive episode (HCC)  Start- ARIPiprazole Lauroxil ER (ARISTADA) 1064 MG/3.9ML prefilled syringe 1,064 mg Start- ARIPiprazole Lauroxil ER (ARISTADA) 1064 MG/3.9ML prefilled syringe; Inject 1,064 mg into the muscle every 2 (two) months.  Dispense: 1.8 mL; Refill: 11 Continue- FLUoxetine (PROZAC) 10 MG capsule; Take 1 capsule (10 mg total) by mouth daily.  Dispense: 30 capsule; Refill: 3    Follow-up in 3 months Follow-up with therapy Shanna Cisco, NP 11/13/2022, 4:35 PM

## 2022-11-20 ENCOUNTER — Other Ambulatory Visit (HOSPITAL_COMMUNITY): Payer: Self-pay | Admitting: Psychiatry

## 2022-11-20 ENCOUNTER — Telehealth (HOSPITAL_COMMUNITY): Payer: Self-pay | Admitting: Psychiatry

## 2022-11-20 DIAGNOSIS — F900 Attention-deficit hyperactivity disorder, predominantly inattentive type: Secondary | ICD-10-CM

## 2022-11-20 MED ORDER — AMPHETAMINE-DEXTROAMPHETAMINE 10 MG PO TABS: 10.0000 mg | ORAL_TABLET | Freq: Every day | ORAL | 0 refills | Status: DC

## 2022-11-20 NOTE — Telephone Encounter (Signed)
Medication refilled and sent to preferred pharmacy.  Patient will need follow-up appointment for further refills.

## 2022-11-21 DIAGNOSIS — Z131 Encounter for screening for diabetes mellitus: Secondary | ICD-10-CM | POA: Diagnosis not present

## 2022-11-21 DIAGNOSIS — E559 Vitamin D deficiency, unspecified: Secondary | ICD-10-CM | POA: Diagnosis not present

## 2022-11-21 DIAGNOSIS — Z0001 Encounter for general adult medical examination with abnormal findings: Secondary | ICD-10-CM | POA: Diagnosis not present

## 2022-11-21 DIAGNOSIS — Z13220 Encounter for screening for lipoid disorders: Secondary | ICD-10-CM | POA: Diagnosis not present

## 2022-11-25 ENCOUNTER — Ambulatory Visit (HOSPITAL_COMMUNITY): Payer: BC Managed Care – PPO | Admitting: Mental Health

## 2022-11-25 ENCOUNTER — Encounter (HOSPITAL_COMMUNITY): Payer: Self-pay

## 2022-11-25 DIAGNOSIS — Z79899 Other long term (current) drug therapy: Secondary | ICD-10-CM

## 2022-12-11 ENCOUNTER — Ambulatory Visit (HOSPITAL_COMMUNITY): Payer: BC Managed Care – PPO

## 2022-12-22 ENCOUNTER — Telehealth (HOSPITAL_COMMUNITY): Payer: Self-pay | Admitting: Psychiatry

## 2022-12-22 NOTE — Telephone Encounter (Signed)
Pt. Clld requesting refill for Adderall be sent to her pharmacy. Thanks

## 2022-12-23 ENCOUNTER — Other Ambulatory Visit (HOSPITAL_COMMUNITY): Payer: Self-pay | Admitting: Psychiatry

## 2022-12-23 DIAGNOSIS — F900 Attention-deficit hyperactivity disorder, predominantly inattentive type: Secondary | ICD-10-CM

## 2022-12-23 MED ORDER — AMPHETAMINE-DEXTROAMPHETAMINE 10 MG PO TABS: 10.0000 mg | ORAL_TABLET | Freq: Every day | ORAL | 0 refills | Status: DC

## 2022-12-23 NOTE — Telephone Encounter (Signed)
Medication refilled and sent to preferred pharmacy

## 2022-12-26 DIAGNOSIS — Z23 Encounter for immunization: Secondary | ICD-10-CM | POA: Diagnosis not present

## 2022-12-26 DIAGNOSIS — F419 Anxiety disorder, unspecified: Secondary | ICD-10-CM | POA: Diagnosis not present

## 2022-12-26 DIAGNOSIS — K5900 Constipation, unspecified: Secondary | ICD-10-CM | POA: Diagnosis not present

## 2022-12-26 DIAGNOSIS — Z124 Encounter for screening for malignant neoplasm of cervix: Secondary | ICD-10-CM | POA: Diagnosis not present

## 2022-12-26 DIAGNOSIS — E7849 Other hyperlipidemia: Secondary | ICD-10-CM | POA: Diagnosis not present

## 2022-12-26 DIAGNOSIS — Z0001 Encounter for general adult medical examination with abnormal findings: Secondary | ICD-10-CM | POA: Diagnosis not present

## 2022-12-26 DIAGNOSIS — R109 Unspecified abdominal pain: Secondary | ICD-10-CM | POA: Diagnosis not present

## 2022-12-26 DIAGNOSIS — F331 Major depressive disorder, recurrent, moderate: Secondary | ICD-10-CM | POA: Diagnosis not present

## 2023-01-30 ENCOUNTER — Telehealth (HOSPITAL_COMMUNITY): Payer: Self-pay | Admitting: Psychiatry

## 2023-02-02 ENCOUNTER — Other Ambulatory Visit (HOSPITAL_COMMUNITY): Payer: Self-pay | Admitting: Psychiatry

## 2023-02-02 DIAGNOSIS — F900 Attention-deficit hyperactivity disorder, predominantly inattentive type: Secondary | ICD-10-CM

## 2023-02-02 MED ORDER — AMPHETAMINE-DEXTROAMPHETAMINE 10 MG PO TABS: 10.0000 mg | ORAL_TABLET | Freq: Every day | ORAL | 0 refills | Status: AC

## 2023-02-02 NOTE — Telephone Encounter (Signed)
Medication refilled and sent to preferred pharmacy.  Provider left patient voicemail informing her that Adderall would not be refilled at her next visit without having a joint drug screen.  No other concerns noted at this time.

## 2023-02-17 ENCOUNTER — Ambulatory Visit (HOSPITAL_COMMUNITY): Payer: BC Managed Care – PPO

## 2023-02-24 ENCOUNTER — Encounter (HOSPITAL_COMMUNITY): Payer: Self-pay | Admitting: Psychiatry

## 2023-02-24 ENCOUNTER — Telehealth (HOSPITAL_COMMUNITY): Payer: BC Managed Care – PPO | Admitting: Psychiatry

## 2023-02-24 DIAGNOSIS — F411 Generalized anxiety disorder: Secondary | ICD-10-CM | POA: Diagnosis not present

## 2023-02-24 DIAGNOSIS — F3181 Bipolar II disorder: Secondary | ICD-10-CM | POA: Diagnosis not present

## 2023-02-24 MED ORDER — ARIPIPRAZOLE 20 MG PO TABS: 20.0000 mg | ORAL_TABLET | Freq: Every day | ORAL | 3 refills | Status: DC

## 2023-02-24 MED ORDER — HYDROXYZINE HCL 10 MG PO TABS: 10.0000 mg | ORAL_TABLET | Freq: Three times a day (TID) | ORAL | 3 refills | Status: DC | PRN

## 2023-02-24 NOTE — Progress Notes (Signed)
BH MD/PA/NP OP Progress Note Virtual Visit via Video Note  I connected with Michaela Williams on 02/24/23 at 11:30 AM EDT by a video enabled telemedicine application and verified that I am speaking with the correct person using two identifiers.  Location: Patient: Home Provider: Clinic   I discussed the limitations of evaluation and management by telemedicine and the availability of in person appointments. The patient expressed understanding and agreed to proceed.  I provided 30 minutes of non-face-to-face time during this encounter.          02/24/2023 12:00 PM Michaela Williams  MRN:  [id number]  Chief Complaint: "I work a lot and have been taking my oral Abilify"   HPI: 27 year old female seen today for follow up psychiatric evaluation.  She has a psychiatric history of bipolar 1 disorder, bipolar 2 disorder, adjustment disorder, anxiety, and depression.  She is currently managed on  Adderall 10 mg daily, Aristada 1064 mg 2 monthly, and Prozac 10 mg.  She notes that she discontinued Prozac due to sedations.  She reports that her other medications are somewhat effective for managing her psychiatric conditions.     Today she was well-groomed, pleasant, cooperative, and engaged in conversation.  She informed Clinical research associate that she has been taking oral Abilify.  She notes that she has been cutting her 20 mg tablets in half.  Patient notes that Julianne Rice does not work on her lifestyle.  Patient informed Clinical research associate that she works an hour and a half away from home.  She notes that she does not have the time to come into the clinic to get her injection and prefers oral medications.  Since reducing her Abilify she notes that she has been irritable, distractible, impulsive spending (reports that she gets her fianc manage her money), having racing thoughts, and fluctuations in mood.  She notes that her friends and peers have complained about her fluctuating emotions.  Patient reports that she will be getting  married in October and wants to be mentally stable.  Since her last visit she notes that she discontinued Prozac.  She informed Clinical research associate that Prozac made her sleepy.  Her anxiety and depression has increased as well since her last visit.  Today provider conducted a GAD-7 and patient scored a 19, at her last visit she scored a 13.  Provider also conducted PHQ-9 and patient scored a 19, at her last visit she scored a 14.  She notes that her appetite fluctuates.  She also informed her weight fluctuates up and down 5 to 10 pounds.  Since her last visit she notes that she has gained 10 pounds.  Today she denies SI/HI/AVH or paranoia.    At this time patient wishes to discontinue Aristada 1064 mg 2 monthly injection.  She will start Abilify 20 mg daily to help manage mood. Patient notes that she does not take hydroxyzine frequently however will start to help managing her anxiety.  At this time she does not wish to restart Prozac.  No other concerns noted at this time.          Visit Diagnosis:    ICD-10-CM   1. Bipolar II disorder major depressive with postpartum onset (HCC)  O99.345 ARIPiprazole (ABILIFY) 20 MG tablet   F31.81     2. Generalized anxiety disorder  F41.1 hydrOXYzine (ATARAX) 10 MG tablet       Past Psychiatric History: bipolar 1 disorder, bipolar 2 disorder, adjustment disorder, anxiety, and depression.    Past Medical History:  Past  Medical History:  Diagnosis Date   Anemia    Anxiety    Asthma    in childhood    breast ca dx'd 05/2017   left   Breast cancer (HCC) 05/2017   dx at age 31   Depression    no meds in 3 yrs   Family history of breast cancer    Headache    migraines   Infection    UTI   Seizures (HCC) 2014   "seizure like activity", reaction to medicaiton, none since   UGI bleed 06/02/2017   Unwanted fertility 08/22/2020   BTL papers signed 08/20/20 Bilateral salpingectomy 09/2020    Past Surgical History:  Procedure Laterality Date    ESOPHAGOGASTRODUODENOSCOPY (EGD) WITH PROPOFOL N/A 06/03/2017   Procedure: ESOPHAGOGASTRODUODENOSCOPY (EGD) WITH PROPOFOL;  Surgeon: Meryl Dare, MD;  Location: [facility];  Service: Endoscopy;  Laterality: N/A;   IUD REMOVAL N/A 09/26/2020   Procedure: INTRAUTERINE DEVICE (IUD) REMOVAL;  Surgeon: Hermina Staggers, MD;  Location: [facility];  Service: Gynecology;  Laterality: N/A;   LAPAROSCOPIC BILATERAL SALPINGECTOMY Bilateral 09/26/2020   Procedure: LAPAROSCOPIC BILATERAL SALPINGECTOMY;  Surgeon: Hermina Staggers, MD;  Location: [facility];  Service: Gynecology;  Laterality: Bilateral;    Family Psychiatric History: Patient was adopted but noted that she was told that her biological mother may have had bipolar disorder or schizophrenia  Family History:  Family History  Adopted: Yes  Problem Relation Age of Onset   Mental illness Mother    Breast cancer Mother    Hypertension Maternal Grandmother    Diabetes Maternal Grandmother    Breast cancer Maternal Grandmother        d. in her 65s, "dealing with breast cancer all her life"   Breast cancer Maternal Aunt    Cervical cancer Maternal Aunt    Breast cancer Sister    Breast cancer Maternal Aunt    Breast cancer Maternal Aunt    Breast cancer Maternal Aunt    Breast cancer Maternal Aunt    Breast cancer Half-Sister        dx in her late 51s   Breast cancer Half-Sister        dx in her late 57s   Cervical cancer Half-Sister    Breast cancer Half-Sister        dx in her late 74s   Leukemia Nephew 4       currently in his teens    Social History:  Social History   Socioeconomic History   Marital status: Single    Spouse name: Not on file   Number of children: 1   Years of education: Not on file   Highest education level: Not on file  Occupational History   Occupation: unemployed  Tobacco Use   Smoking status: Former    Current packs/day: 0.00    Types: Cigarettes    Quit date:  12/19/2014    Years since quitting: 8.1   Smokeless tobacco: Never   Tobacco comments:    age 40  Vaping Use   Vaping status: Former  Substance and Sexual Activity   Alcohol use: Not Currently    Comment: occasionally   Drug use: No   Sexual activity: Not Currently    Birth control/protection: None  Other Topics Concern   Not on file  Social History Narrative   Not on file   Social Determinants of Health   Financial Resource Strain: Medium Risk (04/11/2022)   Overall  Financial Resource Strain (CARDIA)    Difficulty of Paying Living Expenses: Somewhat hard  Food Insecurity: Food Insecurity Present (04/11/2022)   Hunger Vital Sign    Worried About Running Out of Food in the Last Year: Sometimes true    Ran Out of Food in the Last Year: Sometimes true  Transportation Needs: No Transportation Needs (04/11/2022)   PRAPARE - Administrator, Civil Service (Medical): No    Lack of Transportation (Non-Medical): No  Physical Activity: Inactive (04/11/2022)   Exercise Vital Sign    Days of Exercise per Week: 0 days    Minutes of Exercise per Session: 0 min  Stress: Stress Concern Present (04/11/2022)   [employer] of Occupational Health - Occupational Stress Questionnaire    Feeling of Stress : Very much  Social Connections: Moderately Integrated (04/11/2022)   Social Connection and Isolation Panel [NHANES]    Frequency of Communication with Friends and Family: Once a week    Frequency of Social Gatherings with Friends and Family: Never    Attends Religious Services: More than 4 times per year    Active Member of [employer] or Organizations: No    Attends Banker Meetings: 1 to 4 times per year    Marital Status: Living with partner    Allergies:  Allergies  Allergen Reactions   Quetiapine Fumarate     Other reaction(s): Other Black outs/oversedation   Flexeril [Cyclobenzaprine]     Patient had a near syncopal event following Flexeril PO in MAU 11/21/2019    Lithium Other (See Comments)    SEIZURES, Childhood reaction   Lexapro [Escitalopram] Other (See Comments)    Didn't like how it made her feel    Metabolic Disorder Labs: Lab Results  Component Value Date   HGBA1C 5.0 07/03/2017   MPG 96.8 07/03/2017   MPG 96.8 07/01/2017   Lab Results  Component Value Date   PROLACTIN 184.0 (H) 07/01/2017   Lab Results  Component Value Date   CHOL 196 07/03/2017   TRIG 45 07/03/2017   HDL 63 07/03/2017   CHOLHDL 3.1 07/03/2017   VLDL 9 07/03/2017   LDLCALC 124 (H) 07/03/2017   Lab Results  Component Value Date   TSH 1.650 12/07/2018   TSH 1.350 01/06/2018    Therapeutic Level Labs: No results found for: "LITHIUM" No results found for: "VALPROATE" No results found for: "CBMZ"  Current Medications: Current Outpatient Medications  Medication Sig Dispense Refill   ARIPiprazole (ABILIFY) 20 MG tablet Take 1 tablet (20 mg total) by mouth daily. 30 tablet 3   amphetamine-dextroamphetamine (ADDERALL) 10 MG tablet Take 1 tablet (10 mg total) by mouth daily. 30 tablet 0   hydrOXYzine (ATARAX) 10 MG tablet Take 1 tablet (10 mg total) by mouth 3 (three) times daily as needed. 90 tablet 3   No current facility-administered medications for this visit.     Musculoskeletal: Strength & Muscle Tone: within normal limits Gait & Station: normal Patient leans: N/A  Psychiatric Specialty Exam: Review of Systems  currently breastfeeding.There is no height or weight on file to calculate BMI.  General Appearance: Well Groomed  Eye Contact:  Good  Speech:  Clear and Coherent and Normal Rate  Volume:  Normal  Mood:  Anxious and Depressed  Affect:  Congruent and Non-Congruent  Thought Process:  Coherent, Goal Directed and Linear  Orientation:  Full (Time, Place, and Person)  Thought Content: WDL and Logical   Suicidal Thoughts:  No  Homicidal Thoughts:  No  Memory:  Immediate;   Good Recent;   Good Remote;   Good  Judgement:  Good   Insight:  Good  Psychomotor Activity:  Normal  Concentration:  Concentration: Good and Attention Span: Good  Recall:  Good  Fund of Knowledge: Good  Language: Good  Akathisia:  No  Handed:  Right  AIMS (if indicated):Not done  Assets:  Communication Skills Desire for Improvement Financial Resources/Insurance Housing Intimacy Social Support  ADL's:  Intact  Cognition: WNL  Sleep:  Fair   Screenings: AIMS    Flowsheet Row Office Visit from 11/13/2022 in [facility] Admission (Discharged) [facility]  AIMS Total Score 0 0      AUDIT    Flowsheet Row Admission (Discharged) [facility]  Alcohol Use Disorder Identification Test Final Score (AUDIT) 1      GAD-7    Flowsheet Row Video Visit from 02/24/2023 in [facility] Office Visit from 11/13/2022 in [facility] Video Visit from 08/04/2022 in [facility] Counselor from 07/18/2022 in [facility] Video Visit from 06/10/2022 in [facility]  Total GAD-7 Score 19 13 17 21 19       PHQ2-9    Flowsheet Row Video Visit from 02/24/2023 in [facility] Office Visit from 11/13/2022 in [facility] Video Visit from 08/04/2022 in [facility] Counselor from 07/18/2022 in [facility] Video Visit from 06/10/2022 in [facility]  PHQ-2 Total Score 4 2 3 2  0  PHQ-9 Total Score 19 14 14 10 9       Flowsheet Row Video Visit from 02/24/2023 in [facility] ED from 06/04/2022 in [facility] Emergency Department at [facility] ED from 06/03/2022 in [facility] Emergency Department at  [facility]  C-SSRS RISK CATEGORY Error: Q7 should not be populated when Q6 is No No Risk No Risk        Assessment and Plan: Patient endorses increased anxiety and depression.  She informed Clinical research associate that a LAI does not fit her lifestyle and would like to take oral Abilify.At this time patient wishes to discontinue Aristada 1064 mg 2 monthly injection.  She will start Abilify 20 mg daily to help manage mood. Patient notes that she does not take hydroxyzine frequently however will start to help managing her anxiety.  At this time she does not wish to restart Prozac.   1. Generalized anxiety disorder  Continue- hydrOXYzine (ATARAX) 10 MG tablet; Take 1 tablet (10 mg total) by mouth 3 (three) times daily as needed.  Dispense: 90 tablet; Refill: 3  2. Bipolar II disorder major depressive with postpartum onset (HCC)  Restart-/increase ARIPiprazole (ABILIFY) 20 MG tablet; Take 1 tablet (20 mg total) by mouth daily.  Dispense: 30 tablet; Refill: 3    Follow-up in 3 months Follow-up with therapy Shanna Cisco, NP 02/24/2023, 12:00 PM

## 2023-03-16 ENCOUNTER — Other Ambulatory Visit: Payer: Self-pay | Admitting: Family Medicine

## 2023-03-16 DIAGNOSIS — R1084 Generalized abdominal pain: Secondary | ICD-10-CM

## 2023-03-17 ENCOUNTER — Encounter: Payer: Self-pay | Admitting: Family Medicine

## 2023-03-27 ENCOUNTER — Other Ambulatory Visit: Payer: BC Managed Care – PPO

## 2023-04-23 ENCOUNTER — Encounter: Payer: Self-pay | Admitting: Family Medicine

## 2023-04-23 ENCOUNTER — Inpatient Hospital Stay: Admission: RE | Admit: 2023-04-23 | Payer: No Typology Code available for payment source | Source: Ambulatory Visit

## 2023-04-24 ENCOUNTER — Ambulatory Visit
Admission: RE | Admit: 2023-04-24 | Discharge: 2023-04-24 | Disposition: A | Discharge status: Home / Self Care | Payer: No Typology Code available for payment source | Source: Ambulatory Visit | Attending: Family Medicine | Admitting: Family Medicine

## 2023-04-24 DIAGNOSIS — R1084 Generalized abdominal pain: Secondary | ICD-10-CM

## 2023-04-24 MED ORDER — IOPAMIDOL (ISOVUE-300) INJECTION 61%
100.0000 mL | Freq: Once | INTRAVENOUS | Status: AC | PRN
  Administered 2023-04-24: 100 mL via INTRAVENOUS

## 2023-05-14 ENCOUNTER — Emergency Department (HOSPITAL_COMMUNITY)
Admission: EM | Admit: 2023-05-14 | Discharge: 2023-05-14 | Discharge status: Against Medical Advice | Payer: No Typology Code available for payment source

## 2023-05-21 NOTE — Progress Notes (Signed)
BH MD/PA/NP OP Progress Note Virtual Visit via Video Note  I connected with Art Buff on 05/22/2023 at  8:00 AM EST by a video enabled telemedicine application and verified that I am speaking with the correct person using two identifiers.  Location: Patient: Home Provider: Clinic   I discussed the limitations of evaluation and management by telemedicine and the availability of in person appointments. The patient expressed understanding and agreed to proceed.  I provided 30 minutes of non-face-to-face time during this encounter.          05/21/2023 1:56 PM Michaela Williams  MRN:  [id number]  Chief Complaint: "I have not been taking my medications"   HPI: 27 year old female seen today for follow up psychiatric evaluation.  She has a psychiatric history of bipolar 1 disorder, bipolar 2 disorder, adjustment disorder, anxiety, and depression.  She is currently managed on  Adderall 10 mg daily (received from PCP) and Abilify 20 mg daily.  She informed Clinical research associate that she discontinued Abilify noting that it was sedating.  She reports that Adderall is not effective in managing her conditions.     Today she was well-groomed, pleasant, cooperative, tearful, and engaged in conversation.  She informed Clinical research associate that she has been not been taking her medications. She notes that now she feels on edge. Patient notes that she has a busy life and at times feels overwhelmed. Patient notes that she is worried about her son who has autism. She notes that she is frustrated with the school system and notes that she is thinking about home schooling him but is fearful that she will be over stretched.  She informed Clinical research associate that she has little support from her husband and notes that her parents can not assist.  She also notes that her son's father is not as active as he should be.  Patient works in [employer] and notes that no one can do what she does at her job.  She informed Clinical research associate that she does not wish to take  medications all of her life.  Provider informed patient that she was concerned about her mental health.  Patient describes symptoms of hypomania such as irritability, distractibility, fluctuations in mood, and racing thoughts.  She denies SI/HI/AVH or paranoia.  Since her last visit she reports that she continues to be anxious and depressed.  Provider conducted a GAD-7 and patient scored a 20, at her last visit she scored a 19.  Provider also conducted PHQ-9 he scored a 17, at her last visit she scored a 19.  She endorses adequate sleep and appetite.  Provider informed patient that while she was on Abilify or Aristada her mood was more stable.  Provider encouraged patient to care for herself as she notes that if things will  fall apart for her if she is not mentally stable. She reports that she will not have assistance with finances or her children.  Provider informed patient that she was first take care of herself before taking care of others and suggested that she takes Abilify at night to help manage her mood and her sleep.  She notes that she would try to restart it.  Provider also informed patient that if she would like to trial another medication she could.  At this time she notes that she does not wish to try another form as she is fearful of the side effects.  Provider endorsed understanding and agreement.  No other changes made today.  No other concerns noted at this time.  Visit Diagnosis:  No diagnosis found.    Past Psychiatric History: bipolar 1 disorder, bipolar 2 disorder, adjustment disorder, anxiety, and depression.    Past Medical History:  Past Medical History:  Diagnosis Date   Anemia    Anxiety    Asthma    in childhood    breast ca dx'd 05/2017   left   Breast cancer (HCC) 05/2017   dx at age 65   Depression    no meds in 3 yrs   Family history of breast cancer    Headache    migraines   Infection    UTI   Seizures (HCC) 2014   "seizure like  activity", reaction to medicaiton, none since   UGI bleed 06/02/2017   Unwanted fertility 08/22/2020   BTL papers signed 08/20/20 Bilateral salpingectomy 09/2020    Past Surgical History:  Procedure Laterality Date   ESOPHAGOGASTRODUODENOSCOPY (EGD) WITH PROPOFOL N/A 06/03/2017   Procedure: ESOPHAGOGASTRODUODENOSCOPY (EGD) WITH PROPOFOL;  Surgeon: Meryl Dare, MD;  Location: [facility];  Service: Endoscopy;  Laterality: N/A;   IUD REMOVAL N/A 09/26/2020   Procedure: INTRAUTERINE DEVICE (IUD) REMOVAL;  Surgeon: Hermina Staggers, MD;  Location: [facility];  Service: Gynecology;  Laterality: N/A;   LAPAROSCOPIC BILATERAL SALPINGECTOMY Bilateral 09/26/2020   Procedure: LAPAROSCOPIC BILATERAL SALPINGECTOMY;  Surgeon: Hermina Staggers, MD;  Location: [facility];  Service: Gynecology;  Laterality: Bilateral;    Family Psychiatric History: Patient was adopted but noted that she was told that her biological mother may have had bipolar disorder or schizophrenia  Family History:  Family History  Adopted: Yes  Problem Relation Age of Onset   Mental illness Mother    Breast cancer Mother    Hypertension Maternal Grandmother    Diabetes Maternal Grandmother    Breast cancer Maternal Grandmother        d. in her 6s, "dealing with breast cancer all her life"   Breast cancer Maternal Aunt    Cervical cancer Maternal Aunt    Breast cancer Sister    Breast cancer Maternal Aunt    Breast cancer Maternal Aunt    Breast cancer Maternal Aunt    Breast cancer Maternal Aunt    Breast cancer Half-Sister        dx in her late 45s   Breast cancer Half-Sister        dx in her late 110s   Cervical cancer Half-Sister    Breast cancer Half-Sister        dx in her late 58s   Leukemia Nephew 4       currently in his teens    Social History:  Social History   Socioeconomic History   Marital status: Single    Spouse name: Not on file   Number of children: 1   Years of  education: Not on file   Highest education level: Not on file  Occupational History   Occupation: unemployed  Tobacco Use   Smoking status: Former    Current packs/day: 0.00    Types: Cigarettes    Quit date: 12/19/2014    Years since quitting: 8.4   Smokeless tobacco: Never   Tobacco comments:    age 51  Vaping Use   Vaping status: Former  Substance and Sexual Activity   Alcohol use: Not Currently    Comment: occasionally   Drug use: No   Sexual activity: Not Currently    Birth control/protection: None  Other Topics Concern  Not on file  Social History Narrative   Not on file   Social Determinants of Health   Financial Resource Strain: Medium Risk (04/11/2022)   Overall Financial Resource Strain (CARDIA)    Difficulty of Paying Living Expenses: Somewhat hard  Food Insecurity: Food Insecurity Present (04/11/2022)   Hunger Vital Sign    Worried About Running Out of Food in the Last Year: Sometimes true    Ran Out of Food in the Last Year: Sometimes true  Transportation Needs: No Transportation Needs (04/11/2022)   PRAPARE - Administrator, Civil Service (Medical): No    Lack of Transportation (Non-Medical): No  Physical Activity: Inactive (04/11/2022)   Exercise Vital Sign    Days of Exercise per Week: 0 days    Minutes of Exercise per Session: 0 min  Stress: Stress Concern Present (04/11/2022)   [employer] of Occupational Health - Occupational Stress Questionnaire    Feeling of Stress : Very much  Social Connections: Moderately Integrated (04/11/2022)   Social Connection and Isolation Panel [NHANES]    Frequency of Communication with Friends and Family: Once a week    Frequency of Social Gatherings with Friends and Family: Never    Attends Religious Services: More than 4 times per year    Active Member of [employer] or Organizations: No    Attends Banker Meetings: 1 to 4 times per year    Marital Status: Living with partner    Allergies:   Allergies  Allergen Reactions   Quetiapine Fumarate     Other reaction(s): Other Black outs/oversedation   Flexeril [Cyclobenzaprine]     Patient had a near syncopal event following Flexeril PO in MAU 11/21/2019   Lithium Other (See Comments)    SEIZURES, Childhood reaction   Lexapro [Escitalopram] Other (See Comments)    Didn't like how it made her feel    Metabolic Disorder Labs: Lab Results  Component Value Date   HGBA1C 5.0 07/03/2017   MPG 96.8 07/03/2017   MPG 96.8 07/01/2017   Lab Results  Component Value Date   PROLACTIN 184.0 (H) 07/01/2017   Lab Results  Component Value Date   CHOL 196 07/03/2017   TRIG 45 07/03/2017   HDL 63 07/03/2017   CHOLHDL 3.1 07/03/2017   VLDL 9 07/03/2017   LDLCALC 124 (H) 07/03/2017   Lab Results  Component Value Date   TSH 1.650 12/07/2018   TSH 1.350 01/06/2018    Therapeutic Level Labs: No results found for: "LITHIUM" No results found for: "VALPROATE" No results found for: "CBMZ"  Current Medications: Current Outpatient Medications  Medication Sig Dispense Refill   amphetamine-dextroamphetamine (ADDERALL) 10 MG tablet Take 1 tablet (10 mg total) by mouth daily. 30 tablet 0   ARIPiprazole (ABILIFY) 20 MG tablet Take 1 tablet (20 mg total) by mouth daily. 30 tablet 3   hydrOXYzine (ATARAX) 10 MG tablet Take 1 tablet (10 mg total) by mouth 3 (three) times daily as needed. 90 tablet 3   No current facility-administered medications for this visit.     Musculoskeletal: Strength & Muscle Tone: within normal limits and telehealth visit Gait & Station: normal, telehealth visit Patient leans: N/A  Psychiatric Specialty Exam: Review of Systems  currently breastfeeding.There is no height or weight on file to calculate BMI.  General Appearance: Well Groomed  Eye Contact:  Good  Speech:  Clear and Coherent and Normal Rate  Volume:  Normal  Mood:  Anxious and Depressed  Affect:  Congruent  and Non-Congruent  Thought  Process:  Coherent, Goal Directed and Linear  Orientation:  Full (Time, Place, and Person)  Thought Content: WDL and Logical   Suicidal Thoughts:  No  Homicidal Thoughts:  No  Memory:  Immediate;   Good Recent;   Good Remote;   Good  Judgement:  Good  Insight:  Good  Psychomotor Activity:  Normal  Concentration:  Concentration: Good and Attention Span: Good  Recall:  Good  Fund of Knowledge: Good  Language: Good  Akathisia:  No  Handed:  Right  AIMS (if indicated):Not done  Assets:  Communication Skills Desire for Improvement Financial Resources/Insurance Housing Intimacy Social Support  ADL's:  Intact  Cognition: WNL  Sleep:  Fair   Screenings: AIMS    Flowsheet Row Office Visit from 11/13/2022 in [facility] Admission (Dis[facility]) [facility]  AIMS Total Score 0 0      AUDIT    Flowsheet Row Admission (Discharged) from [facility]  Alcohol Use Disorder Identification Test Final Score (AUDIT) 1      GAD-7    Flowsheet Row Video Visit from 02/24/2023 in [facility] Office Visit from 11/13/2022 in [facility] Video Visit from 08/04/2022 in [facility] Counselor from 07/18/2022 in [facility] Video Visit from 06/10/2022 in [facility]  Total GAD-7 Score 19 13 17 21 19       PHQ2-9    Flowsheet Row Video Visit from 02/24/2023 in [facility] Office Visit from 11/13/2022 in [facility] Video Visit from 08/04/2022 in [facility] Counselor from 07/18/2022 in [facility] Video Visit from 06/10/2022 in [facility]  PHQ-2 Total Score 4 2 3 2  0   PHQ-9 Total Score 19 14 14 10 9       Flowsheet Row Video Visit from 02/24/2023 in [facility] ED from 06/04/2022 in [facility] Emergency Department at [facility] ED from 06/03/2022 in [facility] Emergency Department at [facility]  C-SSRS RISK CATEGORY Error: Q7 should not be populated when Q6 is No No Risk No Risk        Assessment and Plan: Patient endorses increased hypomania, anxiety and depression. Provider informed patient that while she was on Abilify or Aristada her mood was more stable.  Provider encouraged patient to care for herself as she notes that if things will  fall apart for her if she is not mentally stable. She reports that she will not have assistance with finances or her children.  Provider informed patient that she was first take care of herself before taking care of others and suggested that she takes Abilify at night to help manage her mood and her sleep.  She notes that she would try to restart it.  Provider also informed patient that if she would like to trial another medication she could.  At this time she notes that she does not wish to try another form as she is fearful of the side effects.  Provider endorsed understanding and agreement.  No other changes made today.    1. Bipolar II disorder major depressive with postpartum onset (HCC)  Restart- ARIPiprazole (ABILIFY) 20 MG tablet; Take 1 tablet (20 mg total) by mouth daily.  Dispense: 30 tablet; Refill: 3    Follow-up in 1 months Follow-up with therapy Shanna Cisco, NP 05/21/2023, 1:56 PM

## 2023-05-22 ENCOUNTER — Encounter (HOSPITAL_COMMUNITY): Payer: Self-pay | Admitting: Psychiatry

## 2023-05-22 ENCOUNTER — Telehealth (HOSPITAL_COMMUNITY): Payer: BC Managed Care – PPO | Admitting: Psychiatry

## 2023-05-22 DIAGNOSIS — F3181 Bipolar II disorder: Secondary | ICD-10-CM | POA: Diagnosis not present

## 2023-05-22 MED ORDER — ARIPIPRAZOLE 20 MG PO TABS
20.0000 mg | ORAL_TABLET | Freq: Every day | ORAL | 3 refills | Status: DC
Start: 2023-05-22 — End: 2023-07-03

## 2023-07-03 ENCOUNTER — Encounter (HOSPITAL_COMMUNITY): Payer: Self-pay | Admitting: Psychiatry

## 2023-07-03 ENCOUNTER — Telehealth (INDEPENDENT_AMBULATORY_CARE_PROVIDER_SITE_OTHER): Payer: BC Managed Care – PPO | Admitting: Psychiatry

## 2023-07-03 DIAGNOSIS — F3181 Bipolar II disorder: Secondary | ICD-10-CM | POA: Diagnosis not present

## 2023-07-03 MED ORDER — ARIPIPRAZOLE 20 MG PO TABS: 20.0000 mg | ORAL_TABLET | Freq: Every day | ORAL | 3 refills | Status: DC

## 2023-07-03 NOTE — Progress Notes (Signed)
BH MD/PA/NP OP Progress Note Virtual Visit via Video Note  I connected with Michaela Williams on 05/22/2023 at  8:00 AM EST by a video enabled telemedicine application and verified that I am speaking with the correct person using two identifiers.  Location: Patient: Home Provider: Clinic   I discussed the limitations of evaluation and management by telemedicine and the availability of in person appointments. The patient expressed understanding and agreed to proceed.  I provided 30 minutes of non-face-to-face time during this encounter.          07/03/2023 8:30 AM Michaela Williams  MRN:  [id number]  Chief Complaint: "I restarted taking the medication but it makes me sleepy"   HPI: 27 year old female seen today for follow up psychiatric evaluation.  She has a psychiatric history of bipolar 1 disorder, bipolar 2 disorder, adjustment disorder, anxiety, and depression.  She is currently managed on  Adderall 10 mg daily (received from PCP) and Abilify 20 mg daily.  She informed Clinical research associate that she discontinued Abilify two weeks ago noting that it was sedating.    Today she was well-groomed, pleasant, cooperative, tearful, and engaged in conversation.  She informed Clinical research associate that she has been not been taking her medications. She notes that she restarted her Abilify but notes that she stopped it 2 weeks ago because it was sedating. She notes that she tried taking it at night but informed writer that she had difficulty waking up. Patient has had this complaint in the past. Provider asked patient if she wanted to try another medication and she was not agreeable. She reports that she will attempt to take the medications earlier.  Patient notes that at times she does not know how she feels until she stops. She reports that she continues to be somewhat overwhelmed but reports that things are better. She did note decide to let her son continue going to public school.  She notes that she spoke with things IEP  teacher and notes that he has more accommodation.  He also receives academic therapy.    Since her last visit she notes her anxiety depression has somewhat improved.  Today provider conducted a GAD-7 the patient scored a 12, at her last visit she scored a 20.  Provider also conducted PHQ-9 patient scored a 15, at her last visit she scored 17.  She informed Clinical research associate that she sleeps 8 hours but notes that this is not her usual.  She finds that 8 hours of sleep is too much.  She also notes that her appetite is poor.  Since her last visit she inform her that she has lost 10 pounds.  Today she denies SI/HI/VAH, mania, paranoia.    Patient informed writer that she has been having abdominal pain.  She notes that in January she will have a biopsy of her colon and have kidneys.  She quantifies her pain as 8 out of 10 and notes that she has been experiencing it since January 2024.  Currently she does not take medication to manage the pain.    Patient reports that she will restart Abilify however notes that she prefers to take it earlier in the day so that she is able to wake up in the morning.   No other concerns noted at this time.           Visit Diagnosis:    ICD-10-CM   1. Bipolar 2 disorder, major depressive episode (HCC)  F31.81 ARIPiprazole (ABILIFY) 20 MG tablet  Past Psychiatric History: bipolar 1 disorder, bipolar 2 disorder, adjustment disorder, anxiety, and depression.    Past Medical History:  Past Medical History:  Diagnosis Date   Anemia    Anxiety    Asthma    in childhood    breast ca dx'd 05/2017   left   Breast cancer (HCC) 05/2017   dx at age 74   Depression    no meds in 3 yrs   Family history of breast cancer    Headache    migraines   Infection    UTI   Seizures (HCC) 2014   "seizure like activity", reaction to medicaiton, none since   UGI bleed 06/02/2017   Unwanted fertility 08/22/2020   BTL papers signed 08/20/20 Bilateral salpingectomy 09/2020     Past Surgical History:  Procedure Laterality Date   ESOPHAGOGASTRODUODENOSCOPY (EGD) WITH PROPOFOL N/A 06/03/2017   Procedure: ESOPHAGOGASTRODUODENOSCOPY (EGD) WITH PROPOFOL;  Surgeon: Meryl Dare, MD;  Location: [facility];  Service: Endoscopy;  Laterality: N/A;   IUD REMOVAL N/A 09/26/2020   Procedure: INTRAUTERINE DEVICE (IUD) REMOVAL;  Surgeon: Hermina Staggers, MD;  Location: [facility];  Service: Gynecology;  Laterality: N/A;   LAPAROSCOPIC BILATERAL SALPINGECTOMY Bilateral 09/26/2020   Procedure: LAPAROSCOPIC BILATERAL SALPINGECTOMY;  Surgeon: Hermina Staggers, MD;  Location: [facility];  Service: Gynecology;  Laterality: Bilateral;    Family Psychiatric History: Patient was adopted but noted that she was told that her biological mother may have had bipolar disorder or schizophrenia  Family History:  Family History  Adopted: Yes  Problem Relation Age of Onset   Mental illness Mother    Breast cancer Mother    Hypertension Maternal Grandmother    Diabetes Maternal Grandmother    Breast cancer Maternal Grandmother        d. in her 2s, "dealing with breast cancer all her life"   Breast cancer Maternal Aunt    Cervical cancer Maternal Aunt    Breast cancer Sister    Breast cancer Maternal Aunt    Breast cancer Maternal Aunt    Breast cancer Maternal Aunt    Breast cancer Maternal Aunt    Breast cancer Half-Sister        dx in her late 31s   Breast cancer Half-Sister        dx in her late 36s   Cervical cancer Half-Sister    Breast cancer Half-Sister        dx in her late 65s   Leukemia Nephew 4       currently in his teens    Social History:  Social History   Socioeconomic History   Marital status: Single    Spouse name: Not on file   Number of children: 1   Years of education: Not on file   Highest education level: Not on file  Occupational History   Occupation: unemployed  Tobacco Use   Smoking status: Former    Current  packs/day: 0.00    Types: Cigarettes    Quit date: 12/19/2014    Years since quitting: 8.5   Smokeless tobacco: Never   Tobacco comments:    age 27  Vaping Use   Vaping status: Former  Substance and Sexual Activity   Alcohol use: Not Currently    Comment: occasionally   Drug use: No   Sexual activity: Not Currently    Birth control/protection: None  Other Topics Concern   Not on file  Social History Narrative  Not on file   Social Drivers of Health   Financial Resource Strain: Medium Risk (04/11/2022)   Overall Financial Resource Strain (CARDIA)    Difficulty of Paying Living Expenses: Somewhat hard  Food Insecurity: Food Insecurity Present (04/11/2022)   Hunger Vital Sign    Worried About Running Out of Food in the Last Year: Sometimes true    Ran Out of Food in the Last Year: Sometimes true  Transportation Needs: No Transportation Needs (04/11/2022)   PRAPARE - Administrator, Civil Service (Medical): No    Lack of Transportation (Non-Medical): No  Physical Activity: Inactive (04/11/2022)   Exercise Vital Sign    Days of Exercise per Week: 0 days    Minutes of Exercise per Session: 0 min  Stress: Stress Concern Present (04/11/2022)   [employer] of Occupational Health - Occupational Stress Questionnaire    Feeling of Stress : Very much  Social Connections: Moderately Integrated (04/11/2022)   Social Connection and Isolation Panel [NHANES]    Frequency of Communication with Friends and Family: Once a week    Frequency of Social Gatherings with Friends and Family: Never    Attends Religious Services: More than 4 times per year    Active Member of [employer] or Organizations: No    Attends Banker Meetings: 1 to 4 times per year    Marital Status: Living with partner    Allergies:  Allergies  Allergen Reactions   Quetiapine Fumarate     Other reaction(s): Other Black outs/oversedation   Flexeril [Cyclobenzaprine]     Patient had a near  syncopal event following Flexeril PO in MAU 11/21/2019   Lithium Other (See Comments)    SEIZURES, Childhood reaction   Lexapro [Escitalopram] Other (See Comments)    Didn't like how it made her feel    Metabolic Disorder Labs: Lab Results  Component Value Date   HGBA1C 5.0 07/03/2017   MPG 96.8 07/03/2017   MPG 96.8 07/01/2017   Lab Results  Component Value Date   PROLACTIN 184.0 (H) 07/01/2017   Lab Results  Component Value Date   CHOL 196 07/03/2017   TRIG 45 07/03/2017   HDL 63 07/03/2017   CHOLHDL 3.1 07/03/2017   VLDL 9 07/03/2017   LDLCALC 124 (H) 07/03/2017   Lab Results  Component Value Date   TSH 1.650 12/07/2018   TSH 1.350 01/06/2018    Therapeutic Level Labs: No results found for: "LITHIUM" No results found for: "VALPROATE" No results found for: "CBMZ"  Current Medications: Current Outpatient Medications  Medication Sig Dispense Refill   amphetamine-dextroamphetamine (ADDERALL) 10 MG tablet Take 1 tablet (10 mg total) by mouth daily. 30 tablet 0   ARIPiprazole (ABILIFY) 20 MG tablet Take 1 tablet (20 mg total) by mouth daily. 30 tablet 3   No current facility-administered medications for this visit.     Musculoskeletal: Strength & Muscle Tone: within normal limits and telehealth visit Gait & Station: normal, telehealth visit Patient leans: N/A  Psychiatric Specialty Exam: Review of Systems  Psychiatric/Behavioral:  Positive for agitation.     currently breastfeeding.There is no height or weight on file to calculate BMI.  General Appearance: Well Groomed  Eye Contact:  Good  Speech:  Clear and Coherent and Normal Rate  Volume:  Normal  Mood:  Anxious and Depressed  Affect:  Congruent and Non-Congruent  Thought Process:  Coherent, Goal Directed and Linear  Orientation:  Full (Time, Place, and Person)  Thought Content: WDL and  Logical   Suicidal Thoughts:  No  Homicidal Thoughts:  No  Memory:  Immediate;   Good Recent;   Good Remote;    Good  Judgement:  Good  Insight:  Good  Psychomotor Activity:  Normal  Concentration:  Concentration: Good and Attention Span: Good  Recall:  Good  Fund of Knowledge: Good  Language: Good  Akathisia:  No  Handed:  Right  AIMS (if indicated):Not done  Assets:  Communication Skills Desire for Improvement Financial Resources/Insurance Housing Intimacy Social Support  ADL's:  Intact  Cognition: WNL  Sleep:  Fair   Screenings: AIMS    Flowsheet Row Office Visit from 11/13/2022 in [facility] Admission (Discharged) [facility]  AIMS Total Score 0 0      AUDIT    Flowsheet Row Admission (Discharged) [facility]  Alcohol Use Disorder Identification Test Final Score (AUDIT) 1      GAD-7    Flowsheet Row Video Visit from 07/03/2023 in [facility] Video Visit from 05/22/2023 in [facility] Video Visit from 02/24/2023 in [facility] Office Visit from 11/13/2022 in [facility] Video Visit from 08/04/2022 in [facility]  Total GAD-7 Score 12 20 19 13 17       PHQ2-9    Flowsheet Row Video Visit from 07/03/2023 in [facility] Video Visit from 05/22/2023 in [facility] Video Visit from 02/24/2023 in [facility] Office Visit from 11/13/2022 in [facility] Video Visit from 08/04/2022 in [facility]  PHQ-2 Total Score 4 3 4 2 3   PHQ-9 Total Score 15 17 19 14 14       Flowsheet Row Video Visit from 02/24/2023 in [facility] ED from 06/04/2022 in [facility] Emergency Department at [facility] ED from 06/03/2022 in  [facility] Emergency Department at [facility]  C-SSRS RISK CATEGORY Error: Q7 should not be populated when Q6 is No No Risk No Risk        Assessment and Plan: Patient reports that her anxiety and depression has improved since her last visit. She does note that Abilify makes her sleepy. Patient reports that she will restart Abilify however notes that she prefers to take it earlier in the day so that she is able to wake up in the morning.   1. Bipolar 2 disorder, major depressive episode (HCC) (Primary)  Restart- ARIPiprazole (ABILIFY) 20 MG tablet; Take 1 tablet (20 mg total) by mouth daily.  Dispense: 30 tablet; Refill: 3     Follow-up in 2.5 months Follow-up with therapy Shanna Cisco, NP 07/03/2023, 8:30 AM

## 2023-09-11 ENCOUNTER — Encounter (HOSPITAL_COMMUNITY): Payer: Self-pay | Admitting: Psychiatry

## 2023-09-11 ENCOUNTER — Telehealth (HOSPITAL_COMMUNITY): Payer: BC Managed Care – PPO | Admitting: Psychiatry

## 2023-09-11 DIAGNOSIS — F3181 Bipolar II disorder: Secondary | ICD-10-CM | POA: Diagnosis not present

## 2023-09-11 NOTE — Progress Notes (Signed)
 [facility] MD/PA/NP OP Progress Note Virtual Visit via Video Note  I connected with Michaela Williams on 05/22/2023 at  8:00 AM EST by a video enabled telemedicine application and verified that I am speaking with the correct person using two identifiers.  Location: Patient: Home Provider: Clinic   I discussed the limitations of evaluation and management by telemedicine and the availability of in person appointments. The patient expressed understanding and agreed to proceed.  I provided 30 minutes of non-face-to-face time during this encounter.          09/11/2023 8:54 AM Michaela Williams  MRN:  [id number]  Chief Complaint: "I am overwhelmed"   HPI: 28 year old female seen today for follow up psychiatric evaluation.  She has a psychiatric history of bipolar 1 disorder, bipolar 2 disorder, adjustment disorder, anxiety, and depression.  She is currently managed on  Adderall 10 mg daily (received from PCP) and Abilify 20 mg daily.  She informed Clinical research associate that she discontinued Abilify.    Today she was well-groomed, pleasant, cooperative,and engaged in conversation.  She informed Clinical research associate that she is overwhelmed with her marriage. She notes that she is not able let things go and hold grudges. She notes that she is no longer happy. She reports that this started years ago but intensified this January after she and her husband got into an argument. To cope patient notes that she isolates. Provider recommended marriage counseling but she reports that financially they are not in the best place. She also notes that they have limited time. She informed Clinical research associate that she is unsure if her marriage will last as her husband does not take effort in the emotional and intimate part of their marriage. She does note that he is a good father.  Patient notes that that she got her son into ABA therapy for his Autism and is excited about his future. Patient also notes that her job is stressful but notes that she is able to cope with  it.  Patient reports that her daughter and other son are doing well.  Patient reports that the above worsen her anxiety and depression. Today provider conducted a GAD 7 patient scored a 20, at her last visit she scored a 12. Provider also conducted a PHQ 9 and patient scored a 24, at her last visit she scored a 15.  She notes that she does not believe that medications will improve her mental state as her marriage is in decline.  Today she notes that her sleep is poor but inform her that she does not wish to start medications to help manage sleep, mood, anxiety, depression.  Patient endorses passive SI but denies wanting to harm herself.  Today she denies SI/HI/AVH, mania, paranoia.    Patient informed Clinical research associate that she continues to have abdominal pain. She quantifies her pain as 7 out of 10 and notes that she is followed by provide to help manage this.  She also informed her that she has been having her heart monitor by a cardiac monitor.  At this time patient does not wish to restart medications to help manage her mood, anxiety, depression, or sleep.  She informed Clinical research associate that she will follow-up 2 months for further evaluation.   No other concerns noted at this time.           Visit Diagnosis:    ICD-10-CM   1. Bipolar 2 disorder, major depressive episode (HCC)  F31.81          Past Psychiatric History:  bipolar 1 disorder, bipolar 2 disorder, adjustment disorder, anxiety, and depression.    Past Medical History:  Past Medical History:  Diagnosis Date   Anemia    Anxiety    Asthma    in childhood    breast ca dx'd 05/2017   left   Breast cancer (HCC) 05/2017   dx at age 75   Depression    no meds in 3 yrs   Family history of breast cancer    Headache    migraines   Infection    UTI   Seizures (HCC) 2014   "seizure like activity", reaction to medicaiton, none since   UGI bleed 06/02/2017   Unwanted fertility 08/22/2020   BTL papers signed 08/20/20 Bilateral salpingectomy  09/2020    Past Surgical History:  Procedure Laterality Date   ESOPHAGOGASTRODUODENOSCOPY (EGD) WITH PROPOFOL N/A 06/03/2017   Procedure: ESOPHAGOGASTRODUODENOSCOPY (EGD) WITH PROPOFOL;  Surgeon: Meryl Dare, MD;  Location: [facility];  Service: Endoscopy;  Laterality: N/A;   IUD REMOVAL N/A 09/26/2020   Procedure: INTRAUTERINE DEVICE (IUD) REMOVAL;  Surgeon: Hermina Staggers, MD;  Location: [facility];  Service: Gynecology;  Laterality: N/A;   LAPAROSCOPIC BILATERAL SALPINGECTOMY Bilateral 09/26/2020   Procedure: LAPAROSCOPIC BILATERAL SALPINGECTOMY;  Surgeon: Hermina Staggers, MD;  Location: [facility];  Service: Gynecology;  Laterality: Bilateral;    Family Psychiatric History: Patient was adopted but noted that she was told that her biological mother may have had bipolar disorder or schizophrenia  Family History:  Family History  Adopted: Yes  Problem Relation Age of Onset   Mental illness Mother    Breast cancer Mother    Hypertension Maternal Grandmother    Diabetes Maternal Grandmother    Breast cancer Maternal Grandmother        d. in her 50s, "dealing with breast cancer all her life"   Breast cancer Maternal Aunt    Cervical cancer Maternal Aunt    Breast cancer Sister    Breast cancer Maternal Aunt    Breast cancer Maternal Aunt    Breast cancer Maternal Aunt    Breast cancer Maternal Aunt    Breast cancer Half-Sister        dx in her late 74s   Breast cancer Half-Sister        dx in her late 29s   Cervical cancer Half-Sister    Breast cancer Half-Sister        dx in her late 68s   Leukemia Nephew 4       currently in his teens    Social History:  Social History   Socioeconomic History   Marital status: Single    Spouse name: Not on file   Number of children: 1   Years of education: Not on file   Highest education level: Not on file  Occupational History   Occupation: unemployed  Tobacco Use   Smoking status: Former     Current packs/day: 0.00    Types: Cigarettes    Quit date: 12/19/2014    Years since quitting: 8.7   Smokeless tobacco: Never   Tobacco comments:    age 28  Vaping Use   Vaping status: Former  Substance and Sexual Activity   Alcohol use: Not Currently    Comment: occasionally   Drug use: No   Sexual activity: Not Currently    Birth control/protection: None  Other Topics Concern   Not on file  Social History Narrative   Not  on file   Social Drivers of Health   Financial Resource Strain: Medium Risk (04/11/2022)   Overall Financial Resource Strain (CARDIA)    Difficulty of Paying Living Expenses: Somewhat hard  Food Insecurity: Food Insecurity Present (04/11/2022)   Hunger Vital Sign    Worried About Running Out of Food in the Last Year: Sometimes true    Ran Out of Food in the Last Year: Sometimes true  Transportation Needs: No Transportation Needs (04/11/2022)   PRAPARE - Administrator, Civil Service (Medical): No    Lack of Transportation (Non-Medical): No  Physical Activity: Inactive (04/11/2022)   Exercise Vital Sign    Days of Exercise per Week: 0 days    Minutes of Exercise per Session: 0 min  Stress: Stress Concern Present (04/11/2022)   [employer] of Occupational Health - Occupational Stress Questionnaire    Feeling of Stress : Very much  Social Connections: Moderately Integrated (04/11/2022)   Social Connection and Isolation Panel [NHANES]    Frequency of Communication with Friends and Family: Once a week    Frequency of Social Gatherings with Friends and Family: Never    Attends Religious Services: More than 4 times per year    Active Member of [employer] or Organizations: No    Attends Banker Meetings: 1 to 4 times per year    Marital Status: Living with partner    Allergies:  Allergies  Allergen Reactions   Quetiapine Fumarate     Other reaction(s): Other Black outs/oversedation   Flexeril [Cyclobenzaprine]     Patient had a  near syncopal event following Flexeril PO in [facility] 11/21/2019   Lithium Other (See Comments)    SEIZURES, Childhood reaction   Lexapro [Escitalopram] Other (See Comments)    Didn't like how it made her feel    Metabolic Disorder Labs: Lab Results  Component Value Date   HGBA1C 5.0 07/03/2017   MPG 96.8 07/03/2017   MPG 96.8 07/01/2017   Lab Results  Component Value Date   PROLACTIN 184.0 (H) 07/01/2017   Lab Results  Component Value Date   CHOL 196 07/03/2017   TRIG 45 07/03/2017   HDL 63 07/03/2017   CHOLHDL 3.1 07/03/2017   VLDL 9 07/03/2017   LDLCALC 124 (H) 07/03/2017   Lab Results  Component Value Date   TSH 1.650 12/07/2018   TSH 1.350 01/06/2018    Therapeutic Level Labs: No results found for: "LITHIUM" No results found for: "VALPROATE" No results found for: "CBMZ"  Current Medications: Current Outpatient Medications  Medication Sig Dispense Refill   amphetamine-dextroamphetamine (ADDERALL) 10 MG tablet Take 1 tablet (10 mg total) by mouth daily. 30 tablet 0   ARIPiprazole (ABILIFY) 20 MG tablet Take 1 tablet (20 mg total) by mouth daily. 30 tablet 3   No current facility-administered medications for this visit.     Musculoskeletal: Strength & Muscle Tone: within normal limits and telehealth visit Gait & Station: normal, telehealth visit Patient leans: N/A  Psychiatric Specialty Exam: Review of Systems  Psychiatric/Behavioral:  Positive for agitation.     currently breastfeeding.There is no height or weight on file to calculate BMI.  General Appearance: Well Groomed  Eye Contact:  Good  Speech:  Clear and Coherent and Normal Rate  Volume:  Normal  Mood:  Anxious and Depressed  Affect:  Congruent and Non-Congruent  Thought Process:  Coherent, Goal Directed and Linear  Orientation:  Full (Time, Place, and Person)  Thought Content: WDL and Logical  Suicidal Thoughts:  Yes.  without intent/plan  Homicidal Thoughts:  No  Memory:  Immediate;    Good Recent;   Good Remote;   Good  Judgement:  Good  Insight:  Good  Psychomotor Activity:  Normal  Concentration:  Concentration: Good and Attention Span: Good  Recall:  Good  Fund of Knowledge: Good  Language: Good  Akathisia:  No  Handed:  Right  AIMS (if indicated):Not done  Assets:  Communication Skills Desire for Improvement Financial Resources/Insurance Housing Intimacy Social Support  ADL's:  Intact  Cognition: WNL  Sleep:  Fair   Screenings: AIMS    Flowsheet Row Office Visit from 11/13/2022 in [facility] Admission (Discharged) [facility]  AIMS Total Score 0 0      AUDIT    Flowsheet Row Admission (Discharged) from [facility]  Alcohol Use Disorder Identification Test Final Score (AUDIT) 1      GAD-7    Flowsheet Row Video Visit from 09/11/2023 in [facility] Video Visit from 07/03/2023 in [facility] Video Visit from 05/22/2023 in [facility] Video Visit from 02/24/2023 in [facility] Office Visit from 11/13/2022 in [facility]  Total GAD-7 Score 20 12 20 19 13       PHQ2-9    Flowsheet Row Video Visit from 09/11/2023 in [facility] Video Visit from 07/03/2023 in [facility] Video Visit from 05/22/2023 in [facility] Video Visit from 02/24/2023 in [facility] Office Visit from 11/13/2022 in [facility]  PHQ-2 Total Score 6 4 3 4 2   PHQ-9 Total Score 24 15 17 19 14       Flowsheet Row Video Visit from 09/11/2023 in [facility] Video Visit from 02/24/2023 in [facility] ED  from 06/04/2022 in [facility] Emergency Department at [facility]  C-SSRS RISK CATEGORY Error: Q7 should not be populated when Q6 is No Error: Q7 should not be populated when Q6 is No No Risk        Assessment and Plan: Patient endorses increased anxiety, depression, insomnia, and irritability.  At this time patient does not wish to restart medications to help manage her mood, anxiety, depression, or sleep.  She informed Clinical research associate that she will follow-up 2 months for further evaluation.    1. Bipolar 2 disorder, major depressive episode (HCC) (Primary)       Follow-up in 2.5 months Follow-up with therapy Shanna Cisco, NP 09/11/2023, 8:54 AM

## 2023-11-20 ENCOUNTER — Telehealth (HOSPITAL_COMMUNITY): Admitting: Psychiatry

## 2023-11-20 ENCOUNTER — Encounter (HOSPITAL_COMMUNITY): Payer: Self-pay | Admitting: Psychiatry

## 2023-11-20 DIAGNOSIS — G479 Sleep disorder, unspecified: Secondary | ICD-10-CM | POA: Diagnosis not present

## 2023-11-20 MED ORDER — TRAZODONE HCL 50 MG PO TABS
50.0000 mg | ORAL_TABLET | Freq: Every evening | ORAL | 3 refills | Status: AC | PRN
Start: 2023-11-20 — End: ?
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
## 2024-01-25 ENCOUNTER — Telehealth (HOSPITAL_COMMUNITY): Admitting: Psychiatry

## 2024-01-29 ENCOUNTER — Telehealth (HOSPITAL_COMMUNITY): Admitting: Family

## 2024-07-21 ENCOUNTER — Encounter: Payer: Self-pay | Admitting: Gastroenterology

## 2024-08-17 ENCOUNTER — Ambulatory Visit: Admitting: Gastroenterology
